# Patient Record
Sex: Male | Born: 1940 | Race: Black or African American | Hispanic: No | Marital: Married | Smoking: Former smoker
Health system: Southern US, Community
[De-identification: ages and names within clinical notes are randomized; demographics above are authoritative.]

## PROBLEM LIST (undated history)

## (undated) DIAGNOSIS — Z955 Presence of coronary angioplasty implant and graft: Secondary | ICD-10-CM

## (undated) DIAGNOSIS — Z9861 Coronary angioplasty status: Secondary | ICD-10-CM

## (undated) DIAGNOSIS — I10 Essential (primary) hypertension: Secondary | ICD-10-CM

## (undated) DIAGNOSIS — R4182 Altered mental status, unspecified: Secondary | ICD-10-CM

## (undated) DIAGNOSIS — E1169 Type 2 diabetes mellitus with other specified complication: Secondary | ICD-10-CM

## (undated) DIAGNOSIS — E669 Obesity, unspecified: Secondary | ICD-10-CM

## (undated) DIAGNOSIS — I420 Dilated cardiomyopathy: Secondary | ICD-10-CM

## (undated) DIAGNOSIS — Z9989 Dependence on other enabling machines and devices: Secondary | ICD-10-CM

## (undated) DIAGNOSIS — I214 Non-ST elevation (NSTEMI) myocardial infarction: Secondary | ICD-10-CM

## (undated) DIAGNOSIS — I2119 ST elevation (STEMI) myocardial infarction involving other coronary artery of inferior wall: Secondary | ICD-10-CM

## (undated) DIAGNOSIS — IMO0001 Reserved for inherently not codable concepts without codable children: Secondary | ICD-10-CM

## (undated) DIAGNOSIS — I255 Ischemic cardiomyopathy: Secondary | ICD-10-CM

## (undated) DIAGNOSIS — Z9189 Other specified personal risk factors, not elsewhere classified: Secondary | ICD-10-CM

## (undated) DIAGNOSIS — Z951 Presence of aortocoronary bypass graft: Secondary | ICD-10-CM

## (undated) DIAGNOSIS — G4733 Obstructive sleep apnea (adult) (pediatric): Secondary | ICD-10-CM

## (undated) DIAGNOSIS — I25810 Atherosclerosis of coronary artery bypass graft(s) without angina pectoris: Secondary | ICD-10-CM

## (undated) DIAGNOSIS — I2510 Atherosclerotic heart disease of native coronary artery without angina pectoris: Secondary | ICD-10-CM

## (undated) DIAGNOSIS — E785 Hyperlipidemia, unspecified: Secondary | ICD-10-CM

## (undated) HISTORY — DX: Dilated cardiomyopathy: I25.5

## (undated) HISTORY — DX: Obesity, unspecified: E11.69

## (undated) HISTORY — DX: Presence of aortocoronary bypass graft: Z95.1

## (undated) HISTORY — DX: Hyperlipidemia, unspecified: E78.5

## (undated) HISTORY — DX: Presence of coronary angioplasty implant and graft: Z95.5

## (undated) HISTORY — DX: Atherosclerotic heart disease of native coronary artery without angina pectoris: I25.10

## (undated) HISTORY — DX: Atherosclerosis of coronary artery bypass graft(s) without angina pectoris: I25.810

## (undated) HISTORY — DX: Obstructive sleep apnea (adult) (pediatric): G47.33

## (undated) HISTORY — DX: Non-ST elevation (NSTEMI) myocardial infarction: I21.4

## (undated) HISTORY — DX: ST elevation (STEMI) myocardial infarction involving other coronary artery of inferior wall: I21.19

## (undated) HISTORY — DX: Type 2 diabetes mellitus with other specified complication: E66.9

## (undated) HISTORY — DX: Dependence on other enabling machines and devices: Z99.89

## (undated) HISTORY — DX: Essential (primary) hypertension: I10

## (undated) HISTORY — PX: JOINT REPLACEMENT: SHX530

## (undated) HISTORY — DX: Dilated cardiomyopathy: I42.0

## (undated) HISTORY — DX: Coronary angioplasty status: Z98.61

## (undated) HISTORY — DX: Reserved for inherently not codable concepts without codable children: IMO0001

---

## 2003-03-13 DIAGNOSIS — I2510 Atherosclerotic heart disease of native coronary artery without angina pectoris: Secondary | ICD-10-CM

## 2003-03-13 DIAGNOSIS — I2119 ST elevation (STEMI) myocardial infarction involving other coronary artery of inferior wall: Secondary | ICD-10-CM

## 2003-03-13 HISTORY — DX: Atherosclerotic heart disease of native coronary artery without angina pectoris: I25.10

## 2003-03-13 HISTORY — PX: CORONARY STENT PLACEMENT: SHX1402

## 2003-03-13 HISTORY — DX: ST elevation (STEMI) myocardial infarction involving other coronary artery of inferior wall: I21.19

## 2004-04-24 ENCOUNTER — Other Ambulatory Visit: Payer: Self-pay

## 2005-11-27 ENCOUNTER — Other Ambulatory Visit: Payer: Self-pay

## 2013-01-10 DIAGNOSIS — I214 Non-ST elevation (NSTEMI) myocardial infarction: Secondary | ICD-10-CM

## 2013-01-10 DIAGNOSIS — I2510 Atherosclerotic heart disease of native coronary artery without angina pectoris: Secondary | ICD-10-CM

## 2013-01-10 DIAGNOSIS — Z951 Presence of aortocoronary bypass graft: Secondary | ICD-10-CM

## 2013-01-10 HISTORY — DX: Presence of aortocoronary bypass graft: Z95.1

## 2013-01-10 HISTORY — DX: Atherosclerotic heart disease of native coronary artery without angina pectoris: I25.10

## 2013-01-10 HISTORY — DX: Non-ST elevation (NSTEMI) myocardial infarction: I21.4

## 2013-01-25 ENCOUNTER — Encounter (HOSPITAL_COMMUNITY): Payer: Self-pay | Admitting: Emergency Medicine

## 2013-01-25 ENCOUNTER — Inpatient Hospital Stay (HOSPITAL_COMMUNITY)
Admission: EM | Admit: 2013-01-25 | Discharge: 2013-02-06 | DRG: 234 | Disposition: A | Discharge status: Home / Self Care | Payer: 59 | Attending: Cardiothoracic Surgery | Admitting: Cardiothoracic Surgery

## 2013-01-25 ENCOUNTER — Other Ambulatory Visit: Payer: Self-pay

## 2013-01-25 ENCOUNTER — Emergency Department (HOSPITAL_COMMUNITY): Payer: 59

## 2013-01-25 DIAGNOSIS — K560 Paralytic ileus: Secondary | ICD-10-CM | POA: Diagnosis not present

## 2013-01-25 DIAGNOSIS — Z96649 Presence of unspecified artificial hip joint: Secondary | ICD-10-CM

## 2013-01-25 DIAGNOSIS — I214 Non-ST elevation (NSTEMI) myocardial infarction: Principal | ICD-10-CM

## 2013-01-25 DIAGNOSIS — J9819 Other pulmonary collapse: Secondary | ICD-10-CM | POA: Diagnosis not present

## 2013-01-25 DIAGNOSIS — E669 Obesity, unspecified: Secondary | ICD-10-CM | POA: Diagnosis present

## 2013-01-25 DIAGNOSIS — Z9189 Other specified personal risk factors, not elsewhere classified: Secondary | ICD-10-CM

## 2013-01-25 DIAGNOSIS — Z96659 Presence of unspecified artificial knee joint: Secondary | ICD-10-CM

## 2013-01-25 DIAGNOSIS — E118 Type 2 diabetes mellitus with unspecified complications: Secondary | ICD-10-CM | POA: Diagnosis present

## 2013-01-25 DIAGNOSIS — I2510 Atherosclerotic heart disease of native coronary artery without angina pectoris: Secondary | ICD-10-CM

## 2013-01-25 DIAGNOSIS — J90 Pleural effusion, not elsewhere classified: Secondary | ICD-10-CM | POA: Diagnosis not present

## 2013-01-25 DIAGNOSIS — IMO0001 Reserved for inherently not codable concepts without codable children: Secondary | ICD-10-CM | POA: Diagnosis present

## 2013-01-25 DIAGNOSIS — Z8546 Personal history of malignant neoplasm of prostate: Secondary | ICD-10-CM

## 2013-01-25 DIAGNOSIS — Z7902 Long term (current) use of antithrombotics/antiplatelets: Secondary | ICD-10-CM

## 2013-01-25 DIAGNOSIS — E8779 Other fluid overload: Secondary | ICD-10-CM | POA: Diagnosis not present

## 2013-01-25 DIAGNOSIS — Z87891 Personal history of nicotine dependence: Secondary | ICD-10-CM

## 2013-01-25 DIAGNOSIS — I252 Old myocardial infarction: Secondary | ICD-10-CM

## 2013-01-25 DIAGNOSIS — I10 Essential (primary) hypertension: Secondary | ICD-10-CM

## 2013-01-25 DIAGNOSIS — K5900 Constipation, unspecified: Secondary | ICD-10-CM | POA: Diagnosis present

## 2013-01-25 DIAGNOSIS — T82897A Other specified complication of cardiac prosthetic devices, implants and grafts, initial encounter: Secondary | ICD-10-CM | POA: Diagnosis present

## 2013-01-25 DIAGNOSIS — D696 Thrombocytopenia, unspecified: Secondary | ICD-10-CM | POA: Diagnosis present

## 2013-01-25 DIAGNOSIS — G4733 Obstructive sleep apnea (adult) (pediatric): Secondary | ICD-10-CM | POA: Diagnosis present

## 2013-01-25 DIAGNOSIS — D62 Acute posthemorrhagic anemia: Secondary | ICD-10-CM | POA: Diagnosis not present

## 2013-01-25 DIAGNOSIS — Z951 Presence of aortocoronary bypass graft: Secondary | ICD-10-CM

## 2013-01-25 DIAGNOSIS — E119 Type 2 diabetes mellitus without complications: Secondary | ICD-10-CM

## 2013-01-25 DIAGNOSIS — E785 Hyperlipidemia, unspecified: Secondary | ICD-10-CM

## 2013-01-25 DIAGNOSIS — Y831 Surgical operation with implant of artificial internal device as the cause of abnormal reaction of the patient, or of later complication, without mention of misadventure at the time of the procedure: Secondary | ICD-10-CM | POA: Diagnosis present

## 2013-01-25 DIAGNOSIS — K219 Gastro-esophageal reflux disease without esophagitis: Secondary | ICD-10-CM | POA: Diagnosis present

## 2013-01-25 DIAGNOSIS — Z79899 Other long term (current) drug therapy: Secondary | ICD-10-CM

## 2013-01-25 DIAGNOSIS — Z9861 Coronary angioplasty status: Secondary | ICD-10-CM

## 2013-01-25 DIAGNOSIS — Z7982 Long term (current) use of aspirin: Secondary | ICD-10-CM

## 2013-01-25 HISTORY — DX: Other specified personal risk factors, not elsewhere classified: Z91.89

## 2013-01-25 LAB — CBC WITH DIFFERENTIAL/PLATELET
Basophils Absolute: 0 10*3/uL (ref 0.0–0.1)
Basophils Relative: 0 % (ref 0–1)
Eosinophils Absolute: 0 10*3/uL (ref 0.0–0.7)
Eosinophils Relative: 1 % (ref 0–5)
Hemoglobin: 15.8 g/dL (ref 13.0–17.0)
Lymphs Abs: 0.7 10*3/uL (ref 0.7–4.0)
MCH: 29.5 pg (ref 26.0–34.0)
MCHC: 35.7 g/dL (ref 30.0–36.0)
Neutro Abs: 3.6 10*3/uL (ref 1.7–7.7)
Neutrophils Relative %: 75 % (ref 43–77)
Platelets: 142 10*3/uL — ABNORMAL LOW (ref 150–400)
RBC: 5.36 MIL/uL (ref 4.22–5.81)

## 2013-01-25 LAB — TSH: TSH: 0.545 u[IU]/mL (ref 0.350–4.500)

## 2013-01-25 LAB — BASIC METABOLIC PANEL
GFR calc Af Amer: >90 mL/min (ref >90)
GFR calc non Af Amer: 81 mL/min — ABNORMAL LOW (ref >90)
Glucose, Bld: 283 mg/dL — ABNORMAL HIGH (ref 70–99)
Potassium: 3.7 mEq/L (ref 3.5–5.1)
Sodium: 136 mEq/L (ref 135–145)

## 2013-01-25 LAB — TROPONIN I
Troponin I: 1.66 ng/mL (ref <0.30)
Troponin I: >20 ng/mL (ref <0.30)
Troponin I: >20 ng/mL (ref <0.30)

## 2013-01-25 LAB — MRSA PCR SCREENING: MRSA by PCR: NEGATIVE

## 2013-01-25 MED ORDER — GLIMEPIRIDE 1 MG PO TABS
1.0000 mg | ORAL_TABLET | Freq: Every day | ORAL | Status: DC
  Filled 2013-01-25 (×2): qty 1

## 2013-01-25 MED ORDER — INFLUENZA VAC SPLIT QUAD 0.5 ML IM SUSP
0.5000 mL | INTRAMUSCULAR | Status: AC
  Administered 2013-01-26: 0.5 mL via INTRAMUSCULAR
  Filled 2013-01-25: qty 0.5

## 2013-01-25 MED ORDER — NITROGLYCERIN 2 % TD OINT
1.0000 [in_us] | TOPICAL_OINTMENT | Freq: Once | TRANSDERMAL | Status: AC
  Administered 2013-01-25: 1 [in_us] via TOPICAL
  Filled 2013-01-25: qty 1

## 2013-01-25 MED ORDER — SODIUM CHLORIDE 0.9 % IV SOLN: 250.0000 mL | INTRAVENOUS | Status: DC | PRN

## 2013-01-25 MED ORDER — GI COCKTAIL ~~LOC~~
30.0000 mL | Freq: Once | ORAL | Status: AC
  Administered 2013-01-25: 30 mL via ORAL
  Filled 2013-01-25: qty 30

## 2013-01-25 MED ORDER — NITROGLYCERIN 0.4 MG SL SUBL
0.4000 mg | SUBLINGUAL_TABLET | SUBLINGUAL | Status: AC | PRN
  Administered 2013-01-25 (×3): 0.4 mg via SUBLINGUAL
  Filled 2013-01-25: qty 25

## 2013-01-25 MED ORDER — SODIUM CHLORIDE 0.9 % IV SOLN
INTRAVENOUS | Status: DC
  Administered 2013-01-26: 06:00:00 via INTRAVENOUS

## 2013-01-25 MED ORDER — PANTOPRAZOLE SODIUM 40 MG PO TBEC
40.0000 mg | DELAYED_RELEASE_TABLET | Freq: Two times a day (BID) | ORAL | Status: DC
  Administered 2013-01-25 – 2013-01-29 (×10): 40 mg via ORAL
  Filled 2013-01-25 (×10): qty 1

## 2013-01-25 MED ORDER — ALUM & MAG HYDROXIDE-SIMETH 200-200-20 MG/5ML PO SUSP
30.0000 mL | Freq: Once | ORAL | Status: AC
  Administered 2013-01-25: 30 mL via ORAL
  Filled 2013-01-25: qty 30

## 2013-01-25 MED ORDER — INSULIN ASPART 100 UNIT/ML ~~LOC~~ SOLN
0.0000 [IU] | Freq: Three times a day (TID) | SUBCUTANEOUS | Status: DC
  Administered 2013-01-25: 2 [IU] via SUBCUTANEOUS
  Administered 2013-01-26: 8 [IU] via SUBCUTANEOUS
  Administered 2013-01-27: 5 [IU] via SUBCUTANEOUS
  Administered 2013-01-27 – 2013-01-29 (×7): 3 [IU] via SUBCUTANEOUS

## 2013-01-25 MED ORDER — ASPIRIN 81 MG PO CHEW
81.0000 mg | CHEWABLE_TABLET | ORAL | Status: AC
  Administered 2013-01-26: 81 mg via ORAL
  Filled 2013-01-25: qty 1

## 2013-01-25 MED ORDER — METOPROLOL SUCCINATE ER 100 MG PO TB24
200.0000 mg | ORAL_TABLET | Freq: Every day | ORAL | Status: DC
  Administered 2013-01-25 – 2013-01-29 (×4): 200 mg via ORAL
  Filled 2013-01-25 (×6): qty 2

## 2013-01-25 MED ORDER — HEPARIN (PORCINE) IN NACL 100-0.45 UNIT/ML-% IJ SOLN
1300.0000 [IU]/h | INTRAMUSCULAR | Status: DC
  Administered 2013-01-25 – 2013-01-26 (×2): 1300 [IU]/h via INTRAVENOUS
  Filled 2013-01-25 (×3): qty 250

## 2013-01-25 MED ORDER — NITROGLYCERIN IN D5W 200-5 MCG/ML-% IV SOLN
2.0000 ug/min | INTRAVENOUS | Status: DC
  Administered 2013-01-25: 20 ug/min via INTRAVENOUS
  Filled 2013-01-25: qty 250

## 2013-01-25 MED ORDER — HEPARIN BOLUS VIA INFUSION
4000.0000 [IU] | Freq: Once | INTRAVENOUS | Status: AC
  Administered 2013-01-25: 4000 [IU] via INTRAVENOUS
  Filled 2013-01-25: qty 4000

## 2013-01-25 MED ORDER — SODIUM CHLORIDE 0.9 % IJ SOLN: 3.0000 mL | INTRAMUSCULAR | Status: DC | PRN

## 2013-01-25 MED ORDER — RAMIPRIL 10 MG PO CAPS
20.0000 mg | ORAL_CAPSULE | Freq: Every day | ORAL | Status: DC
  Administered 2013-01-25 – 2013-01-29 (×4): 20 mg via ORAL
  Filled 2013-01-25 (×6): qty 2

## 2013-01-25 MED ORDER — ACETAMINOPHEN 325 MG PO TABS: 650.0000 mg | ORAL_TABLET | ORAL | Status: DC | PRN

## 2013-01-25 MED ORDER — SIMVASTATIN 40 MG PO TABS
40.0000 mg | ORAL_TABLET | Freq: Every day | ORAL | Status: DC
  Administered 2013-01-25 – 2013-02-06 (×11): 40 mg via ORAL
  Filled 2013-01-25 (×13): qty 1

## 2013-01-25 MED ORDER — ONDANSETRON HCL 4 MG/2ML IJ SOLN
4.0000 mg | Freq: Four times a day (QID) | INTRAMUSCULAR | Status: DC | PRN
  Administered 2013-01-26 (×2): 4 mg via INTRAVENOUS
  Filled 2013-01-25 (×2): qty 2

## 2013-01-25 MED ORDER — CLOPIDOGREL BISULFATE 75 MG PO TABS
75.0000 mg | ORAL_TABLET | Freq: Every day | ORAL | Status: DC
  Administered 2013-01-26: 75 mg via ORAL
  Filled 2013-01-25 (×2): qty 1

## 2013-01-25 MED ORDER — ASPIRIN 81 MG PO CHEW
324.0000 mg | CHEWABLE_TABLET | Freq: Once | ORAL | Status: AC
  Administered 2013-01-25: 324 mg via ORAL
  Filled 2013-01-25: qty 4

## 2013-01-25 MED ORDER — SODIUM CHLORIDE 0.9 % IJ SOLN
3.0000 mL | Freq: Two times a day (BID) | INTRAMUSCULAR | Status: DC
  Administered 2013-01-25 – 2013-01-26 (×3): 3 mL via INTRAVENOUS

## 2013-01-25 MED ORDER — PNEUMOCOCCAL VAC POLYVALENT 25 MCG/0.5ML IJ INJ
0.5000 mL | INJECTION | INTRAMUSCULAR | Status: AC
  Administered 2013-01-26: 0.5 mL via INTRAMUSCULAR
  Filled 2013-01-25: qty 0.5

## 2013-01-25 NOTE — ED Notes (Signed)
Increased nitro to 15 mcg/min- pt sts feels "pressure".

## 2013-01-25 NOTE — H&P (Signed)
Earl Williamson is an 72 y.o. male.   Chief Complaint: Chest Pain HPI:  The patient is a 72 yo male with a history of CAD with three stents placed several year ago at [facility].  Atleast two were in the OM3   He does not see a cardiologist regularly.  His history also includes DM, HTN, OSA.  He presents today with CP which began two week ago.  He describes it as sharp, 9/10 at the worse, radiation across chest to left arm, nausea, diaphoresis, dizziness, abd pain.  He denies vomiting, fever,  shortness of breath, orthopnea, PND, cough, congestion, hematochezia, melena, lower extremity edema.  He thought it was acid reflux however, several times he said he thought he was having a heart attack as well.  He also saw his PCP a week ago who did an EKG.  On Friday he had a severe episode while at the grocery store but decline EMS.  .   Medications: Prior to Admission medications   Medication Sig Start Date End Date Taking? Authorizing Provider  calcium elemental as carbonate (TUMS ULTRA 1000) 400 MG tablet Chew 1,000 mg by mouth as needed for heartburn.    Yes Historical Provider, MD  clopidogrel (PLAVIX) 75 MG tablet Take 75 mg by mouth daily with breakfast.   Yes Historical Provider, MD  glimepiride (AMARYL) 1 MG tablet Take 1 mg by mouth daily with breakfast.   Yes Historical Provider, MD  metFORMIN (GLUCOPHAGE) 500 MG tablet Take 500 mg by mouth 2 (two) times daily with a meal.   Yes Historical Provider, MD  metoprolol (TOPROL-XL) 200 MG 24 hr tablet Take 200 mg by mouth daily.   Yes Historical Provider, MD  pantoprazole (PROTONIX) 40 MG tablet Take 40 mg by mouth 2 (two) times daily.    Yes Historical Provider, MD  ramipril (ALTACE) 10 MG capsule Take 20 mg by mouth daily.   Yes Historical Provider, MD  simethicone (GAS-X EXTRA STRENGTH) 125 MG chewable tablet Chew 125 mg by mouth every 6 (six) hours as needed for flatulence.    Yes Historical Provider, MD  simvastatin (ZOCOR) 40 MG tablet Take 40 mg by  mouth daily.   Yes Historical Provider, MD      Past Medical History  Diagnosis Date  . Coronary artery disease   . Diabetes mellitus without complication   . Hypertension   . Sleep apnea t  . Prostate cancer t    Past Surgical History  Procedure Laterality Date  . Joint replacement    . Cardiac surgery      Family History  Problem Relation Age of Onset  . Heart attack Mother   . Heart attack Sister   . Heart attack Brother    Social History:  reports that he has quit smoking. His smoking use included Cigarettes. He smoked 0.00 packs per day. He has never used smokeless tobacco. He reports that he does not drink alcohol or use illicit drugs.  Allergies: No Known Allergies   (Not in a hospital admission)  Results for orders placed during the hospital encounter of 01/25/13 (from the past 48 hour(s))  CBC WITH DIFFERENTIAL     Status: Abnormal   Collection Time    01/25/13  9:35 AM      Result Value Range   WBC 4.9  4.0 - 10.5 K/uL   RBC 5.36  4.22 - 5.81 MIL/uL   Hemoglobin 15.8  13.0 - 17.0 g/dL   HCT 13.0  86.5 -  52.0 %   MCV 82.5  78.0 - 100.0 fL   MCH 29.5  26.0 - 34.0 pg   MCHC 35.7  30.0 - 36.0 g/dL   RDW 45.4  09.8 - 11.9 %   Platelets 142 (*) 150 - 400 K/uL   Neutrophils Relative % 75  43 - 77 %   Neutro Abs 3.6  1.7 - 7.7 K/uL   Lymphocytes Relative 15  12 - 46 %   Lymphs Abs 0.7  0.7 - 4.0 K/uL   Monocytes Relative 10  3 - 12 %   Monocytes Absolute 0.5  0.1 - 1.0 K/uL   Eosinophils Relative 1  0 - 5 %   Eosinophils Absolute 0.0  0.0 - 0.7 K/uL   Basophils Relative 0  0 - 1 %   Basophils Absolute 0.0  0.0 - 0.1 K/uL  BASIC METABOLIC PANEL     Status: Abnormal   Collection Time    01/25/13  9:35 AM      Result Value Range   Sodium 136  135 - 145 mEq/L   Potassium 3.7  3.5 - 5.1 mEq/L   Chloride 98  96 - 112 mEq/L   CO2 21  19 - 32 mEq/L   Glucose, Bld 283 (*) 70 - 99 mg/dL   BUN 11  6 - 23 mg/dL   Creatinine, Ser 1.47  0.50 - 1.35 mg/dL    Calcium 9.5  8.4 - 82.9 mg/dL   GFR calc non Af Amer 81 (*) >90 mL/min   GFR calc Af Amer >90  >90 mL/min   Comment: (NOTE)     The eGFR has been calculated using the CKD EPI equation.     This calculation has not been validated in all clinical situations.     eGFR's persistently <90 mL/min signify possible Chronic Kidney     Disease.  TROPONIN I     Status: Abnormal   Collection Time    01/25/13  9:35 AM      Result Value Range   Troponin I 1.66 (*) <0.30 ng/mL   Comment: CRITICAL RESULT CALLED TO, READ BACK BY AND VERIFIED WITH:     DAVISWRN [id number]                Due to the release kinetics of cTnI,     a negative result within the first hours     of the onset of symptoms does not rule out     myocardial infarction with certainty.     If myocardial infarction is still suspected,     repeat the test at appropriate intervals.     MCCAULEG RTV  PROTIME-INR     Status: None   Collection Time    01/25/13  9:35 AM      Result Value Range   Prothrombin Time 13.3  11.6 - 15.2 seconds   INR 1.03  0.00 - 1.49  APTT     Status: Abnormal   Collection Time    01/25/13  9:35 AM      Result Value Range   aPTT 49 (*) 24 - 37 seconds   Comment:            IF BASELINE aPTT IS ELEVATED,     SUGGEST PATIENT RISK ASSESSMENT     BE USED TO DETERMINE APPROPRIATE     ANTICOAGULANT THERAPY.   Dg Chest 2 View  01/25/2013   CLINICAL DATA:  Chest pain and shortness of breath  for 1 week. Hypertension. Ex-smoker.  EXAM: CHEST  2 VIEW  COMPARISON:  None.  FINDINGS: Lateral view degraded by patient arm position. Moderate lower thoracic spondylosis. Midline trachea. Moderate cardiomegaly. Mild right hemidiaphragm elevation. No pleural effusion or pneumothorax. No congestive failure. Clear lungs.  IMPRESSION: Cardiomegaly, without congestive failure or acute disease.   Electronically Signed   By: Jeronimo Greaves M.D.   On: 01/25/2013 09:52    Review of Systems  Constitutional: Positive for  diaphoresis. Negative for fever.  HENT: Negative for congestion and sore throat.   Respiratory: Negative for cough and shortness of breath.   Cardiovascular: Positive for chest pain. Negative for orthopnea, leg swelling and PND.  Gastrointestinal: Positive for nausea and abdominal pain. Negative for vomiting, blood in stool and melena.  Genitourinary: Negative for hematuria.  Musculoskeletal: Positive for myalgias.  Neurological: Positive for dizziness.  All other systems reviewed and are negative.    Blood pressure 175/85, pulse 79, temperature 98 F (36.7 C), temperature source Oral, resp. rate 20, height 5\' 6"  (1.676 m), weight 230 lb (104.327 kg), SpO2 96.00%. Physical Exam  Nursing note and vitals reviewed. Constitutional: He is oriented to person, place, and time. He appears well-developed. No distress.  Obese  HENT:  Head: Normocephalic and atraumatic.  Mouth/Throat: No oropharyngeal exudate.  Eyes: EOM are normal. Pupils are equal, round, and reactive to light. No scleral icterus.  Neck: Normal range of motion. Neck supple. No JVD present.  Cardiovascular: Normal rate, regular rhythm, S1 normal and S2 normal.   No murmur heard. Pulses:      Radial pulses are 2+ on the right side, and 2+ on the left side.       Dorsalis pedis pulses are 2+ on the right side, and 1+ on the left side.       Posterior tibial pulses are 2+ on the left side.  No Carotid Bruits  Respiratory: Effort normal and breath sounds normal. He has no wheezes. He has no rales.  GI: Soft. Bowel sounds are normal. He exhibits distension. There is tenderness (RLQ).  Musculoskeletal: He exhibits no edema.  Neurological: He is alert and oriented to person, place, and time. He exhibits normal muscle tone.  Skin: Skin is warm and dry.  Psychiatric: He has a normal mood and affect.     Assessment/Plan  Principal Problem:   NSTEMI (non-ST elevated myocardial infarction) Active Problems:   CAD S/P percutaneous  coronary angioplasty - PCI  x 3 stents @ [facility]   DM type 2 (diabetes mellitus, type 2)   HTN (hypertension), benign   HLD (hyperlipidemia)  Plan: Admit to stepdown.  IV heparin and NTG.  ASA, plavix, BB, statin, ACE.  SCr is WNL.  Left heart cath tomorrow.  CBGs.  SS insulin.     HAGER, BRYAN 01/25/2013, 12:47 PM  I have seen and evaluated the patient this PM along Honor Junes, PA. I agree with his findings, examination as well as impression recommendations.  The patient is a 72 year old gentleman with known coronary disease along with multiple cardiac risk factors as listed. As best I can tell he currently had a stent in his RCA as well as LAD and circumflex. I don't have records for the RCA, but he was told he had a staged intervention secondary to the other side artery.  He's been having about 2 weeks of intermittent chest comfort that he describes as a sharp, he felt this was similar to his heart attack. Despite the current  description of his symptoms, he at the time felt that this was due to feeling bloated with gas.  His initial troponin levels are actually positive, and his ECG from EMS did show mild ST elevations in lead 3 only her there's significant Q waves in inferior leads with the lateral ST depressions. Initial EMS ECG was read as meeting criteria for STEMI, however upon arrival to the emergency room he was pain-free, in the ER the ECG that was checked was less suggestive of any true ST elevations.  Or evaluation in the emergency room, the patient remains symptom-free,  So we will simply admit him as a non-ST elevation MI. He will place on nitroglycerin drip as well as IV heparin. His artery on Plavix as well as beta blocker and ACE inhibitor.  The plan will be left heart catheterization with possible PCI tomorrow by one of my partners. I reviewed the procedure was well as the risks, benefits, alternatives and indications of the procedure with the patient and family. He agrees to  proceed.  We will ensure that he is on CPAP overnight. I've also instructed him that he was to have any significant discomfort not relieved by nitroglycerin that we should go ahead and urgent catheterization today.   Marykay Lex, M.D., M.S. [facility] [address] [place], [place]  [address]  [phone] Pager # [phone] 01/25/2013 3:32 PM

## 2013-01-25 NOTE — Progress Notes (Signed)
I am not sure why some pre-cath nursing orders are showing d/c by me, so I re-order them so night shift can see them.

## 2013-01-25 NOTE — Progress Notes (Signed)
Pt reported "feelings like gas under my rib. Multiple burping episodes noted. Maalox ordered x 1.

## 2013-01-25 NOTE — Progress Notes (Signed)
Patient  C/O left chest pain 5/10  SL Nitro 0.4 mg administered x 3, 02 initiated @4LNC  , EKG done.  Chest Pain is now 2/10 non-radiating pain. Pt also reported pain started after eating chicken and mash potatoes. MD paged we will continue to monitor.

## 2013-01-25 NOTE — ED Provider Notes (Signed)
CSN: [id number]     Arrival date & time 01/25/13  0905 History   First MD Initiated Contact with Patient 01/25/13 0913     Chief Complaint  Patient presents with  . Chest Pain   (Consider location/radiation/quality/duration/timing/severity/associated sxs/prior Treatment) Patient is a 72 y.o. male presenting with chest pain.  Chest Pain  Pt with known history of CAD s/p stent x 3 several years ago, done at [facility] reports about a week of off and on sharp mid sternal chest pain, associated with mild SOB, comes and goes, seems to be worse after eating but also with exertion. He apparently saw his PCP for similar symptoms earlier this week and is scheduled for Echo to be done tomorrow. He does not follow with a cardiologist on a regular basis. Pain improved with NTG given by EMS, he does not take NTG at home.   Past Medical History  Diagnosis Date  . Coronary artery disease   . Diabetes mellitus without complication   . Hypertension   . Sleep apnea t  . Prostate cancer t   Past Surgical History  Procedure Laterality Date  . Joint replacement    . Cardiac surgery     History reviewed. No pertinent family history. History  Substance Use Topics  . Smoking status: Former Games developer  . Smokeless tobacco: Never Used  . Alcohol Use: No    Review of Systems  Cardiovascular: Positive for chest pain.   All other systems reviewed and are negative except as noted in HPI.   Allergies  Review of patient's allergies indicates no known allergies.  Home Medications   Current Outpatient Rx  Name  Route  Sig  Dispense  Refill  . calcium elemental as carbonate (TUMS ULTRA 1000) 400 MG tablet   Oral   Chew 1,000 mg by mouth as needed for heartburn.          . clopidogrel (PLAVIX) 75 MG tablet   Oral   Take 75 mg by mouth daily with breakfast.         . glimepiride (AMARYL) 1 MG tablet   Oral   Take 1 mg by mouth daily with breakfast.         . metFORMIN (GLUCOPHAGE) 500 MG tablet    Oral   Take 500 mg by mouth 2 (two) times daily with a meal.         . metoprolol (TOPROL-XL) 200 MG 24 hr tablet   Oral   Take 200 mg by mouth daily.         . pantoprazole (PROTONIX) 40 MG tablet   Oral   Take 40 mg by mouth 2 (two) times daily.          . ramipril (ALTACE) 10 MG capsule   Oral   Take 20 mg by mouth daily.         . simethicone (GAS-X EXTRA STRENGTH) 125 MG chewable tablet   Oral   Chew 125 mg by mouth every 6 (six) hours as needed for flatulence.          . simvastatin (ZOCOR) 40 MG tablet   Oral   Take 40 mg by mouth daily.          BP 175/85  Pulse 69  Temp(Src) 98 F (36.7 C) (Oral)  Resp 16  SpO2 97% Physical Exam  Nursing note and vitals reviewed. Constitutional: He is oriented to person, place, and time. He appears well-developed and well-nourished.  HENT:  Head: Normocephalic  and atraumatic.  Eyes: EOM are normal. Pupils are equal, round, and reactive to light.  Neck: Normal range of motion. Neck supple.  Cardiovascular: Normal rate, normal heart sounds and intact distal pulses.   Pulmonary/Chest: Effort normal and breath sounds normal.  Abdominal: Bowel sounds are normal. He exhibits no distension. There is no tenderness.  Musculoskeletal: Normal range of motion. He exhibits no edema and no tenderness.  Neurological: He is alert and oriented to person, place, and time. He has normal strength. No cranial nerve deficit or sensory deficit.  Skin: Skin is warm and dry. No rash noted.  Psychiatric: He has a normal mood and affect.    ED Course  Procedures (including critical care time)  CRITICAL CARE Performed by: Pollyann Savoy. Total critical care time: 30 Critical care time was exclusive of separately billable procedures and treating other patients. Critical care was necessary to treat or prevent imminent or life-threatening deterioration. Critical care was time spent personally by me on the following activities:  development of treatment plan with patient and/or surrogate as well as nursing, discussions with consultants, evaluation of patient's response to treatment, examination of patient, obtaining history from patient or surrogate, ordering and performing treatments and interventions, ordering and review of laboratory studies, ordering and review of radiographic studies, pulse oximetry and re-evaluation of patient's condition.   Labs Review Labs Reviewed  CBC WITH DIFFERENTIAL - Abnormal; Notable for the following:    Platelets 142 (*)    All other components within normal limits  BASIC METABOLIC PANEL - Abnormal; Notable for the following:    Glucose, Bld 283 (*)    GFR calc non Af Amer 81 (*)    All other components within normal limits  TROPONIN I - Abnormal; Notable for the following:    Troponin I 1.66 (*)    All other components within normal limits  APTT - Abnormal; Notable for the following:    aPTT 49 (*)    All other components within normal limits  PROTIME-INR   Imaging Review Dg Chest 2 View  01/25/2013   CLINICAL DATA:  Chest pain and shortness of breath for 1 week. Hypertension. Ex-smoker.  EXAM: CHEST  2 VIEW  COMPARISON:  None.  FINDINGS: Lateral view degraded by patient arm position. Moderate lower thoracic spondylosis. Midline trachea. Moderate cardiomegaly. Mild right hemidiaphragm elevation. No pleural effusion or pneumothorax. No congestive failure. Clear lungs.  IMPRESSION: Cardiomegaly, without congestive failure or acute disease.   Electronically Signed   By: Jeronimo Greaves M.D.   On: 01/25/2013 09:52    EKG Interpretation     Ventricular Rate:  74 PR Interval:  184 QRS Duration: 109 QT Interval:  417 QTC Calculation: 463 R Axis:   -5 Text Interpretation:  Sinus rhythm Probable left atrial enlargement LVH with secondary repolarization abnormality Inferior infarct, old Probable anterior infarct, age indeterminate No old tracing to compare            MDM   No diagnosis found.  Labs reviewed. Trop elevated, consistent with non-STEMI. Pt is currently pain free after GI cocktail, heparin gtt and Nitro paste ordered. Discussed with Dr. Herbie Baltimore who will evaluate the patient in the ED.     Charles B. Bernette Mayers, MD 01/25/13 1110

## 2013-01-25 NOTE — Progress Notes (Signed)
ANTICOAGULATION CONSULT NOTE - Initial Consult  Pharmacy Consult for heparin Indication: chest pain/ACS  No Known Allergies  Patient Measurements:    Weight: 104.3 kg per pt statement  Vital Signs: Temp: 98 F (36.7 C) (11/16 0900) Temp src: Oral (11/16 0900) BP: 175/85 mmHg (11/16 0955) Pulse Rate: 69 (11/16 0955)  Labs:  Recent Labs  01/25/13 0935  HGB 15.8  HCT 44.2  PLT 142*  APTT 49*  LABPROT 13.3  INR 1.03  CREATININE 0.97  TROPONINI 1.66*    CrCl is unknown because there is no height on file for the current visit.   Medical History: Past Medical History  Diagnosis Date  . Coronary artery disease   . Diabetes mellitus without complication   . Hypertension   . Sleep apnea t  . Prostate cancer t    Medications:  plavix  Assessment: Earl Williamson is a 72 yo man to start heparin per pharmacy for chest pain with a positive troponin.  He states his ht is 66 inches and his wt is 230 pounds.  His baseline aPTT is slightly elevated at 49 sec, his INR is 1.03 and his H/H is 15.8/44.2 and his platelets are on the low side at 142. His creat is 0.97. His troponin is elevated at 1.66.  Cardiology to be consulted.   Goal of Therapy:  Heparin level 0.3-0.7 units/ml Monitor platelets by anticoagulation protocol: Yes   Plan:  Give 4000 units bolus x 1 Start heparin infusion at 1300 units/hr Check anti-Xa level in 8 hours and daily while on heparin Continue to monitor H&H and platelets  Herby Abraham, Pharm.D. [phone] 01/25/2013 11:08 AM

## 2013-01-25 NOTE — Progress Notes (Signed)
ANTICOAGULATION CONSULT NOTE - Follow Up Consult  Pharmacy Consult for Heparin Indication: chest pain/ACS  No Known Allergies Patient Measurements: Height: 5\' 6"  (167.6 cm) Weight: 283 lb 4.7 oz (128.5 kg) IBW/kg (Calculated) : 63.8 Heparin Dosing Weight: 94 kg Vital Signs: Temp: 98 F (36.7 C) (11/16 1515) Temp src: Oral (11/16 1515) BP: 149/88 mmHg (11/16 1900) Pulse Rate: 71 (11/16 1900) Labs:  Recent Labs  01/25/13 0935 01/25/13 1610 01/25/13 1935  HGB 15.8  --   --   HCT 44.2  --   --   PLT 142*  --   --   APTT 49*  --   --   LABPROT 13.3  --   --   INR 1.03  --   --   HEPARINUNFRC  --   --  0.44  CREATININE 0.97  --   --   TROPONINI 1.66* >20.00* >20.00*   Estimated Creatinine Clearance: 88.6 ml/min (by C-G formula based on Cr of 0.97).  Medications:  Heparin @ 1300 units/hr  Assessment: [name] on IV heparin for ACS/+ troponin.   Goal of Therapy:  Heparin level 0.3-0.7 units/ml Monitor platelets by anticoagulation protocol: Yes   Plan:  1. Continue heparin at 1300 units/hr. 2. Recheck heparin level in  8 hrs (AM level).  Fayne Norrie 01/25/2013,8:42 PM

## 2013-01-25 NOTE — ED Notes (Signed)
Advised dr Bernette Mayers of critical lab.

## 2013-01-25 NOTE — ED Notes (Signed)
Pt arrives to ed via [facility] for CP x 1 week.  Pt sts not get tx because thoght it was reflux.  Pt given 1-0.4 sl nitro by ems. Pt took 325 ASA at home.  Pt sts pain 7/10.  Pt caox4, pmsx4, nad.  Pt denies recent illness/injury.

## 2013-01-25 NOTE — Progress Notes (Signed)
B/P 143/96 HR 64 RR 19 02 Sat 99 % 4LNC .  Pt denies left chest pain and reported feeling better after sitting at the side of his bed.We will continue to monitor.

## 2013-01-26 ENCOUNTER — Encounter (HOSPITAL_COMMUNITY)
Admission: EM | Disposition: A | Discharge status: Home / Self Care | Payer: Self-pay | Source: Home / Self Care | Attending: Cardiothoracic Surgery

## 2013-01-26 ENCOUNTER — Other Ambulatory Visit: Payer: Self-pay | Admitting: *Deleted

## 2013-01-26 DIAGNOSIS — Z9861 Coronary angioplasty status: Secondary | ICD-10-CM

## 2013-01-26 DIAGNOSIS — I2510 Atherosclerotic heart disease of native coronary artery without angina pectoris: Secondary | ICD-10-CM

## 2013-01-26 DIAGNOSIS — I214 Non-ST elevation (NSTEMI) myocardial infarction: Secondary | ICD-10-CM

## 2013-01-26 DIAGNOSIS — Z01810 Encounter for preprocedural cardiovascular examination: Secondary | ICD-10-CM

## 2013-01-26 HISTORY — PX: LEFT HEART CATHETERIZATION WITH CORONARY ANGIOGRAM: SHX5451

## 2013-01-26 LAB — BASIC METABOLIC PANEL
CO2: 26 mEq/L (ref 19–32)
Chloride: 100 mEq/L (ref 96–112)
Creatinine, Ser: 0.88 mg/dL (ref 0.50–1.35)
GFR calc Af Amer: >90 mL/min (ref >90)
Potassium: 3.6 mEq/L (ref 3.5–5.1)

## 2013-01-26 LAB — GLUCOSE, CAPILLARY
Glucose-Capillary: 237 mg/dL — ABNORMAL HIGH (ref 70–99)
Glucose-Capillary: 255 mg/dL — ABNORMAL HIGH (ref 70–99)

## 2013-01-26 LAB — URINALYSIS, ROUTINE W REFLEX MICROSCOPIC
Glucose, UA: >1000 mg/dL — AB
Hgb urine dipstick: NEGATIVE
Ketones, ur: NEGATIVE mg/dL
Leukocytes, UA: NEGATIVE
pH: 6 (ref 5.0–8.0)

## 2013-01-26 LAB — BLOOD GAS, ARTERIAL
Acid-base deficit: 0.2 mmol/L (ref 0.0–2.0)
Bicarbonate: 23.7 mEq/L (ref 20.0–24.0)
Drawn by: 362771
FIO2: 0.21 %
O2 Saturation: 90.3 %
Patient temperature: 98.6
TCO2: 24.8 mmol/L (ref 0–100)
pCO2 arterial: 36.9 mmHg (ref 35.0–45.0)
pH, Arterial: 7.423 (ref 7.350–7.450)
pO2, Arterial: 58.9 mmHg — ABNORMAL LOW (ref 80.0–100.0)

## 2013-01-26 LAB — CBC
HCT: 40.4 % (ref 39.0–52.0)
MCH: 29.5 pg (ref 26.0–34.0)
MCV: 83.5 fL (ref 78.0–100.0)
Platelets: 141 10*3/uL — ABNORMAL LOW (ref 150–400)
RBC: 4.84 MIL/uL (ref 4.22–5.81)
RDW: 13.5 % (ref 11.5–15.5)
WBC: 6.3 10*3/uL (ref 4.0–10.5)

## 2013-01-26 LAB — MAGNESIUM: Magnesium: 1.7 mg/dL (ref 1.5–2.5)

## 2013-01-26 LAB — HEPARIN LEVEL (UNFRACTIONATED)
Heparin Unfractionated: 0.35 IU/mL (ref 0.30–0.70)
Heparin Unfractionated: 0.21 IU/mL — ABNORMAL LOW (ref 0.30–0.70)

## 2013-01-26 LAB — SURGICAL PCR SCREEN
MRSA, PCR: NEGATIVE
Staphylococcus aureus: NEGATIVE

## 2013-01-26 LAB — URINE MICROSCOPIC-ADD ON

## 2013-01-26 LAB — LIPID PANEL
Cholesterol: 150 mg/dL (ref 0–200)
LDL Cholesterol: 86 mg/dL (ref 0–99)
VLDL: 25 mg/dL (ref 0–40)

## 2013-01-26 LAB — PROTIME-INR: Prothrombin Time: 13.9 seconds (ref 11.6–15.2)

## 2013-01-26 LAB — TROPONIN I: Troponin I: >20 ng/mL (ref <0.30)

## 2013-01-26 SURGERY — LEFT HEART CATHETERIZATION WITH CORONARY ANGIOGRAM
Anesthesia: LOCAL

## 2013-01-26 MED ORDER — INSULIN GLARGINE 100 UNIT/ML ~~LOC~~ SOLN
14.0000 [IU] | Freq: Every day | SUBCUTANEOUS | Status: DC
  Administered 2013-01-26: 14 [IU] via SUBCUTANEOUS
  Filled 2013-01-26 (×2): qty 0.14

## 2013-01-26 MED ORDER — ASPIRIN 81 MG PO CHEW
81.0000 mg | CHEWABLE_TABLET | Freq: Every day | ORAL | Status: DC
  Administered 2013-01-27 – 2013-01-29 (×3): 81 mg via ORAL
  Filled 2013-01-26 (×3): qty 1

## 2013-01-26 MED ORDER — HEPARIN (PORCINE) IN NACL 2-0.9 UNIT/ML-% IJ SOLN
INTRAMUSCULAR | Status: AC
  Filled 2013-01-26: qty 1500

## 2013-01-26 MED ORDER — HEPARIN (PORCINE) IN NACL 100-0.45 UNIT/ML-% IJ SOLN
1500.0000 [IU]/h | INTRAMUSCULAR | Status: DC
  Administered 2013-01-26: 1300 [IU]/h via INTRAVENOUS
  Administered 2013-01-27 – 2013-01-28 (×2): 1500 [IU]/h via INTRAVENOUS
  Filled 2013-01-26 (×8): qty 250

## 2013-01-26 MED ORDER — MORPHINE SULFATE 2 MG/ML IJ SOLN: 1.0000 mg | INTRAMUSCULAR | Status: DC | PRN

## 2013-01-26 MED ORDER — LIDOCAINE HCL (PF) 1 % IJ SOLN
INTRAMUSCULAR | Status: AC
  Filled 2013-01-26: qty 30

## 2013-01-26 MED ORDER — MIDAZOLAM HCL 2 MG/2ML IJ SOLN
INTRAMUSCULAR | Status: AC
  Filled 2013-01-26: qty 2

## 2013-01-26 MED ORDER — HEPARIN SODIUM (PORCINE) 1000 UNIT/ML IJ SOLN
INTRAMUSCULAR | Status: AC
  Filled 2013-01-26: qty 1

## 2013-01-26 MED ORDER — NITROGLYCERIN 0.2 MG/ML ON CALL CATH LAB
INTRAVENOUS | Status: AC
  Filled 2013-01-26: qty 1

## 2013-01-26 MED ORDER — MAGNESIUM OXIDE 400 (241.3 MG) MG PO TABS
400.0000 mg | ORAL_TABLET | Freq: Every day | ORAL | Status: DC
  Administered 2013-01-27 – 2013-01-29 (×3): 400 mg via ORAL
  Filled 2013-01-26 (×4): qty 1

## 2013-01-26 MED ORDER — MAGNESIUM OXIDE 400 (241.3 MG) MG PO TABS: 400.0000 mg | ORAL_TABLET | Freq: Every day | ORAL | Status: DC

## 2013-01-26 MED ORDER — VERAPAMIL HCL 2.5 MG/ML IV SOLN
INTRAVENOUS | Status: AC
  Filled 2013-01-26: qty 2

## 2013-01-26 MED ORDER — ONDANSETRON HCL 4 MG/2ML IJ SOLN: 4.0000 mg | Freq: Four times a day (QID) | INTRAMUSCULAR | Status: DC | PRN

## 2013-01-26 MED ORDER — FENTANYL CITRATE 0.05 MG/ML IJ SOLN
INTRAMUSCULAR | Status: AC
  Filled 2013-01-26: qty 2

## 2013-01-26 MED ORDER — SODIUM CHLORIDE 0.9 % IV SOLN
INTRAVENOUS | Status: AC
  Administered 2013-01-26: 14:00:00 via INTRAVENOUS

## 2013-01-26 MED ORDER — ACETAMINOPHEN 325 MG PO TABS: 650.0000 mg | ORAL_TABLET | ORAL | Status: DC | PRN

## 2013-01-26 NOTE — Interval H&P Note (Signed)
Cath Lab Visit (complete for each Cath Lab visit)  Clinical Evaluation Leading to the Procedure:   ACS: yes  Non-ACS:    Anginal Classification: CCS IV  Anti-ischemic medical therapy: Maximal Therapy (2 or more classes of medications)  Non-Invasive Test Results: No non-invasive testing performed  Prior CABG: No previous CABG      History and Physical Interval Note:  01/26/2013 10:42 AM  Earl Williamson  has presented today for surgery, with the diagnosis of cp  The various methods of treatment have been discussed with the patient and family. After consideration of risks, benefits and other options for treatment, the patient has consented to  Procedure(s): LEFT HEART CATHETERIZATION WITH CORONARY ANGIOGRAM (N/A) as a surgical intervention .  The patient's history has been reviewed, patient examined, no change in status, stable for surgery.  I have reviewed the patient's chart and labs.  Questions were answered to the patient's satisfaction.     Runell Gess

## 2013-01-26 NOTE — Progress Notes (Signed)
Inpatient Diabetes Program Recommendations  AACE/ADA: New Consensus Statement on Inpatient Glycemic Control (2013)  Target Ranges:  Prepandial:   less than 140 mg/dL      Peak postprandial:   less than 180 mg/dL (1-2 hours)      Critically ill patients:  140 - 180 mg/dL   Reason for Visit: Results for ROCKEY, GUARINO (MRN [id number]) as of 01/26/2013 12:50  Ref. Range 01/25/2013 15:16 01/25/2013 22:00  Glucose-Capillary Latest Range: 70-99 mg/dL 409 (H) 811 (H)  Results for HAWKEN, BIELBY (MRN [id number]) as of 01/26/2013 12:50  Ref. Range 01/25/2013 16:10  Hemoglobin A1C Latest Range: <5.7 % 10.7 (H)   According to Medication reconciliation, patient was only taking Metformin prior to admission.  May consider adding basal insulin Levemir 25 units daily (0.2 units/kg) while in the hospital.  Patient will likely need insulin at discharge based on A1C.  Will follow.    Beryl Meager, RN, BC-ADM Inpatient Diabetes Coordinator Pager [phone]

## 2013-01-26 NOTE — Progress Notes (Signed)
Subjective: Pain free  Objective: Vital signs in last 24 hours: Temp:  [98 F (36.7 C)-98.2 F (36.8 C)] 98.2 F (36.8 C) (11/17 0000) Pulse Rate:  [60-134] 63 (11/17 0700) Resp:  [11-24] 20 (11/17 0700) BP: (104-175)/(54-121) 139/70 mmHg (11/17 0700) SpO2:  [96 %-100 %] 99 % (11/17 0700) Weight:  [230 lb (104.327 kg)-283 lb 4.7 oz (128.5 kg)] 283 lb 4.7 oz (128.5 kg) (11/16 1505) Last BM Date: 01/24/13  Intake/Output from previous day: 11/16 0701 - 11/17 0700 In: 338.5 [P.O.:240; I.V.:98.5] Out: 2350 [Urine:2350] Intake/Output this shift:    Medications Current Facility-Administered Medications  Medication Dose Route Frequency Provider Last Rate Last Dose  . 0.9 %  sodium chloride infusion   Intravenous Continuous Wilburt Finlay, PA-C 10 mL/hr at 01/26/13 0700    . 0.9 %  sodium chloride infusion  250 mL Intravenous PRN Wilburt Finlay, PA-C      . acetaminophen (TYLENOL) tablet 650 mg  650 mg Oral Q4H PRN Wilburt Finlay, PA-C      . clopidogrel (PLAVIX) tablet 75 mg  75 mg Oral Q breakfast Wilburt Finlay, PA-C   75 mg at 01/26/13 1610  . glimepiride (AMARYL) tablet 1 mg  1 mg Oral Q breakfast Wilburt Finlay, PA-C      . heparin ADULT infusion 100 units/mL (25000 units/250 mL)  1,300 Units/hr Intravenous Continuous Herby Abraham, RPH 13 mL/hr at 01/26/13 0700 1,300 Units/hr at 01/26/13 0700  . influenza vac split quadrivalent PF (FLUARIX) injection 0.5 mL  0.5 mL Intramuscular Tomorrow-1000 Wilburt Finlay, PA-C      . insulin aspart (novoLOG) injection 0-15 Units  0-15 Units Subcutaneous TID WC Wilburt Finlay, PA-C   2 Units at 01/25/13 1627  . metoprolol succinate (TOPROL-XL) 24 hr tablet 200 mg  200 mg Oral Daily Wilburt Finlay, PA-C   200 mg at 01/25/13 1625  . nitroGLYCERIN 0.2 mg/mL in dextrose 5 % infusion  2-200 mcg/min Intravenous Titrated Wilburt Finlay, PA-C 6 mL/hr at 01/26/13 0700 20 mcg/min at 01/26/13 0700  . ondansetron (ZOFRAN) injection 4 mg  4 mg Intravenous Q6H PRN Wilburt Finlay,  PA-C      . pantoprazole (PROTONIX) EC tablet 40 mg  40 mg Oral BID Wilburt Finlay, PA-C   40 mg at 01/25/13 2216  . pneumococcal 23 valent vaccine (PNU-IMMUNE) injection 0.5 mL  0.5 mL Intramuscular Tomorrow-1000 Wilburt Finlay, PA-C      . ramipril (ALTACE) capsule 20 mg  20 mg Oral Daily Wilburt Finlay, PA-C   20 mg at 01/25/13 1625  . simvastatin (ZOCOR) tablet 40 mg  40 mg Oral Daily Wilburt Finlay, PA-C   40 mg at 01/25/13 1623  . sodium chloride 0.9 % injection 3 mL  3 mL Intravenous Q12H Wilburt Finlay, PA-C   3 mL at 01/25/13 2216  . sodium chloride 0.9 % injection 3 mL  3 mL Intravenous PRN Wilburt Finlay, PA-C        PE: General appearance: alert, cooperative and no distress Lungs: clear to auscultation bilaterally Heart: regular rate and rhythm, S1, S2 normal, no murmur, click, rub or gallop Abdomen: +BS, Nontender Extremities: No LEE Pulses: 2+ and symmetric Neurologic: Grossly normal  Lab Results:   Recent Labs  01/25/13 0935 01/26/13 0335  WBC 4.9 6.3  HGB 15.8 14.3  HCT 44.2 40.4  PLT 142* 141*   BMET  Recent Labs  01/25/13 0935 01/26/13 0335  NA 136 138  K 3.7 3.6  CL 98 100  CO2 21 26  GLUCOSE 283* 244*  BUN 11 10  CREATININE 0.97 0.88  CALCIUM 9.5 8.9   PT/INR  Recent Labs  01/25/13 0935 01/26/13 0335  LABPROT 13.3 13.9  INR 1.03 1.09   Cholesterol  Recent Labs  01/26/13 0335  CHOL 150   Lipid Panel     Component Value Date/Time   CHOL 150 01/26/2013 0335   TRIG 127 01/26/2013 0335   HDL 39* 01/26/2013 0335   CHOLHDL 3.8 01/26/2013 0335   VLDL 25 01/26/2013 0335   LDLCALC 86 01/26/2013 0335   Cardiac Panel (last 3 results)  Recent Labs  01/25/13 1610 01/25/13 1935 01/26/13 0335  TROPONINI >20.00* >20.00* >20.00*    Assessment/Plan  Principal Problem:   NSTEMI (non-ST elevated myocardial infarction) Active Problems:   CAD S/P percutaneous coronary angioplasty - PCI  x 3 stents @ [facility]   DM type 2 (diabetes mellitus, type 2)   HTN  (hypertension), benign   HLD (hyperlipidemia)   Thrombocytopenia  Plan: Pain free.  Troponin >20!  Left heart cath today.  BP and HR stable.     LOS: 1 day    HAGER, BRYAN 01/26/2013 8:07 AM   Agree with note written by Jones Skene PAC  S/P NSTEMI with Trop > 20. H/O Stent X 3 at [facility] 2005. Symptomatic for the past week. Currently pain free on iv hep/NTG. EKG shows evolution of Anterolateral TWI as well as a hint of Inf ST elevation . Exam benign. Plan cor angio today radially.   Runell Gess 01/26/2013 8:44 AM

## 2013-01-26 NOTE — Progress Notes (Signed)
Placed pt on CPAP via nasal mask, auto titrate settings (min 6.0-max 20.0)  Pt. Tolerating well at this time. RN aware.

## 2013-01-26 NOTE — Consult Note (Signed)
[address]       [place] [address]             [phone]        Karriem Muench [facility] Medical Record #[id number] Date of Birth: 05/06/1940  Referring: No ref. provider found Primary Care: No primary provider on file.  Chief Complaint:    Chief Complaint  Patient presents with  . Chest Pain   chest pain, epigastric discomfort present for the past 2 weeks but with severe increase in intensity the past 48 hours. Patient examined and cardiac catheterization with coronary angiogram is reviewed  History of Present Illness:     72 year old obese African  American male admitted with a subendocardial MI with troponin greater than 20.0 ng per mL. Cardiac catheterization demonstrates severe three-vessel CAD. 10 years ago the patient underwent treatment with PCI and drug-eluting stents at [facility] for acute MI. He had stents placed to the LAD right and also the circumflex. He is been on Plavix daily since then.  Cardiac catheterization demonstrates high-grade 99% stenosis of the proximal LAD with some diffuse distal disease. The right coronary has 99% stenosis with diffuse distal disease to the posterior descending.. The circumflex has a patent proximal stent. EF is 20%. LVEDP was 24. 2-D echocardiogram is pending.  Current Activity/ Functional Status: Patient is retired is being a Naval architect. He can still walk despite left total hip replacement and right knee replacement. He has not smoked for years.   Zubrod Score: At the time of surgery this patient's most appropriate activity status/level should be described as: []  Normal activity, no symptoms [x]  Symptoms, fully ambulatory []  Symptoms, in bed less than or equal to 50% of the time []  Symptoms, in bed greater than 50% of the time but less than 100% []  Bedridden []  Moribund  Past Medical History  Diagnosis Date  . Coronary artery disease   . Diabetes mellitus without complication   .  Hypertension   . Sleep apnea t  . Prostate cancer t    Past Surgical History  Procedure Laterality Date  . Joint replacement    . Cardiac surgery      History  Smoking status  . Former Smoker  . Types: Cigarettes  Smokeless tobacco  . Never Used    History  Alcohol Use No    History   Social History  . Marital Status: Married    Spouse Name: N/A    Number of Children: N/A  . Years of Education: N/A   Occupational History  . Not on file.   Social History Main Topics  . Smoking status: Former Smoker    Types: Cigarettes  . Smokeless tobacco: Never Used  . Alcohol Use: No  . Drug Use: No  . Sexual Activity: Not Currently   Other Topics Concern  . Not on file   Social History Narrative  . No narrative on file    No Known Allergies  Current Facility-Administered Medications  Medication Dose Route Frequency Provider Last Rate Last Dose  . 0.9 %  sodium chloride infusion   Intravenous Continuous Runell Gess, MD 75 mL/hr at 01/26/13 1414    . acetaminophen (TYLENOL) tablet 650 mg  650 mg Oral Q4H PRN Wilburt Finlay, PA-C      . aspirin chewable tablet 81 mg  81 mg Oral Daily Runell Gess, MD      . heparin ADULT infusion 100 units/mL (25000 units/250  mL)  1,300 Units/hr Intravenous Continuous Gardner Candle, RPH 13 mL/hr at 01/26/13 1549 1,300 Units/hr at 01/26/13 1549  . insulin aspart (novoLOG) injection 0-15 Units  0-15 Units Subcutaneous TID WC Wilburt Finlay, PA-C   8 Units at 01/26/13 1732  . insulin glargine (LANTUS) injection 14 Units  14 Units Subcutaneous QHS Kerin Perna, MD      . Melene Muller ON 01/27/2013] magnesium oxide (MAG-OX) tablet 400 mg  400 mg Oral Daily Marykay Lex, MD      . metoprolol succinate (TOPROL-XL) 24 hr tablet 200 mg  200 mg Oral Daily Wilburt Finlay, PA-C   200 mg at 01/25/13 1625  . morphine 2 MG/ML injection 1 mg  1 mg Intravenous Q1H PRN Runell Gess, MD      . nitroGLYCERIN 0.2 mg/mL in dextrose 5 % infusion  2-200  mcg/min Intravenous Titrated Wilburt Finlay, PA-C 3 mL/hr at 01/26/13 1445 10 mcg/min at 01/26/13 1445  . ondansetron (ZOFRAN) injection 4 mg  4 mg Intravenous Q6H PRN Wilburt Finlay, PA-C   4 mg at 01/26/13 1453  . pantoprazole (PROTONIX) EC tablet 40 mg  40 mg Oral BID Wilburt Finlay, PA-C   40 mg at 01/26/13 1008  . ramipril (ALTACE) capsule 20 mg  20 mg Oral Daily Wilburt Finlay, PA-C   20 mg at 01/25/13 1625  . simvastatin (ZOCOR) tablet 40 mg  40 mg Oral Daily Wilburt Finlay, PA-C   40 mg at 01/25/13 1623    Prescriptions prior to admission  Medication Sig Dispense Refill  . calcium elemental as carbonate (TUMS ULTRA 1000) 400 MG tablet Chew 1,000 mg by mouth as needed for heartburn.       . clopidogrel (PLAVIX) 75 MG tablet Take 75 mg by mouth daily with breakfast.      . glimepiride (AMARYL) 1 MG tablet Take 1 mg by mouth daily with breakfast.      . metFORMIN (GLUCOPHAGE) 500 MG tablet Take 500 mg by mouth 2 (two) times daily with a meal.      . metoprolol (TOPROL-XL) 200 MG 24 hr tablet Take 200 mg by mouth daily.      . pantoprazole (PROTONIX) 40 MG tablet Take 40 mg by mouth 2 (two) times daily.       . ramipril (ALTACE) 10 MG capsule Take 20 mg by mouth daily.      . simethicone (GAS-X EXTRA STRENGTH) 125 MG chewable tablet Chew 125 mg by mouth every 6 (six) hours as needed for flatulence.       . simvastatin (ZOCOR) 40 MG tablet Take 40 mg by mouth daily.        Family History  Problem Relation Age of Onset  . Heart attack Mother   . Heart attack Sister   . Heart attack Brother    family history positive for diabetes and hypertension   Review of Systems:     Cardiac Review of Systems: Y or N  Chest Pain [Y.    ]  Resting SOB [ NN  ] Exertional SOB  [ Y. ]  Orthopnea [  ]   Pedal Edema [N.]    Palpitations [ Y. ] Syncope  [N.  ]   Presyncope [N.   ]  General Review of Systems: [Y] = yes [  ]=no Constitional: recent weight change [  ]; anorexia [  ]; fatigue [  ]; nausea [  ]; night  sweats [  ]; fever [  ];  or chills [  ]                                                               Dental: poor dentition[Y.  ]; Last Dentist visit: Greater than one year, several loose teeth present   Eye : blurred vision [  ]; diplopia [   ]; vision changes [  ];  Amaurosis fugax[  ]; Resp: cough [  ];  wheezing[  ];  hemoptysis[  ]; shortness of breath[ Y. ]; paroxysmal nocturnal dyspnea[Y.  ]; dyspnea on exertion[Y.  ]; or orthopnea[ Y. ]; history of sleep apnea on CPAP GI:  gallstones[  ], vomiting[  ];  dysphagia[  ]; melena[  ];  hematochezia [  ]; heartburn[  ];   Hx of  Colonoscopy[  ]; GU: kidney stones [  ]; hematuria[  ];   dysuria [  ];  nocturia[  ];  history of     obstruction [  ]; urinary frequency [  ]             Skin: rash, swelling[  ];, hair loss[  ];  peripheral edema[  ];  or itching[  ]; Musculosketetal: myalgias[  ];  joint swelling[  ];  joint erythema[  ];  joint pain[  ];  back pain[  ];  Heme/Lymph: bruising[ Y.-on Plavix ];  bleeding[  ];  anemia[  ];  Neuro: TIA[  ];  headaches[  ];  stroke[  ];  vertigo[  ];  seizures[  ];   paresthesias[  ];  difficulty walking[  ];  Psych:depression[  ]; anxiety[  ];  Endocrine: diabetes[Y.  ];  thyroid dysfunction[  ];  Immunizations: Flu [  ]; Pneumococcal[  ];  Other:  Physical Exam: BP 123/72  Pulse 68  Temp(Src) 99.2 F (37.3 C) (Oral)  Resp 21  Ht 5\' 6"  (1.676 m)  Wt 236 lb 15.9 oz (107.5 kg)  BMI 38.27 kg/m2  SpO2 64%  Gen.-72 year old obese male in no acute distress HEENT-poor dentition pupils equal Neck-no JVD mass or carotid bruit   thorax-distant breath sounds Cardiac-regular rhythm without murmur or gallop Abdomen-obese soft nontender without pulsatile mass Extremities-no clubbing or significant edema, no tenderness, compression device over right wrist radial artery catheterization, surgical scars from left hip replacement right knee surgery Neurologic-no focal motor deficit Vascular-nonpalpable  pedal pulses  Studies & Laboratory data:    carotid duplex shows no significant carotid disease 2-D echocardiogram pending PFTs pending hemoglobin A1c greater than 10.0   Recent Radiology Findings:   Dg Chest 2 View  01/25/2013   CLINICAL DATA:  Chest pain and shortness of breath for 1 week. Hypertension. Ex-smoker.  EXAM: CHEST  2 VIEW  COMPARISON:  None.  FINDINGS: Lateral view degraded by patient arm position. Moderate lower thoracic spondylosis. Midline trachea. Moderate cardiomegaly. Mild right hemidiaphragm elevation. No pleural effusion or pneumothorax. No congestive failure. Clear lungs.  IMPRESSION: Cardiomegaly, without congestive failure or acute disease.   Electronically Signed   By: Jeronimo Greaves M.D.   On: 01/25/2013 09:52      Recent Lab Findings: Lab Results  Component Value Date   WBC 6.3 01/26/2013   HGB 14.3 01/26/2013   HCT 40.4 01/26/2013   PLT 141* 01/26/2013  GLUCOSE 244* 01/26/2013   CHOL 150 01/26/2013   TRIG 127 01/26/2013   HDL 39* 01/26/2013   LDLCALC 86 01/26/2013   NA 138 01/26/2013   K 3.6 01/26/2013   CL 100 01/26/2013   CREATININE 0.88 01/26/2013   BUN 10 01/26/2013   CO2 26 01/26/2013   TSH 0.545 01/25/2013   INR 1.09 01/26/2013   HGBA1C 10.7* 01/25/2013      Assessment / Plan:     72 year old male with recent significant non-ST elevation MI and severe multivessel CAD and a diabetic pattern. Not a candidate for PCI. Best long-term therapy would be surgical coronary revascularization which I discussed with the patient and he will consider. We'll check platelet function assay-P2 Y. 12 2 assess Plavix activity. Surgery can be scheduled hopefully for later this week. Pending results of platelet studies. Will follow.        @ME1 @ 01/26/2013 7:02 PM

## 2013-01-26 NOTE — Progress Notes (Signed)
01/26/2013 1345 TR Band removed and dssg applied, Instructions given.

## 2013-01-26 NOTE — Care Management Note (Addendum)
    Page 1 of 1   02/04/2013     1:27:06 PM   CARE MANAGEMENT NOTE 02/04/2013  Patient:  Earl Williamson, Earl Williamson   Account Number:  [id number]  Date Initiated:  01/26/2013  Documentation initiated by:  Junius Creamer  Subjective/Objective Assessment:   adm w mi     Action/Plan:   lives w wife   Anticipated DC Date:  02/05/2013   Anticipated DC Plan:  HOME W HOME HEALTH SERVICES      DC Planning Services  CM consult      Choice offered to / List presented to:     DME arranged  Levan Hurst      DME agency  [facility]        Status of service:  In process, will continue to follow Medicare Important Message given?   (If response is "NO", the following Medicare IM given date fields will be blank) Date Medicare IM given:   Date Additional Medicare IM given:    Discharge Disposition:    Per UR Regulation:  Reviewed for med. necessity/level of care/duration of stay  If discussed at Long Length of Stay Meetings, dates discussed:   02/03/2013    Comments:  02/04/13 Christoher Drudge,RN,BSN [phone] PT NEEDS RW FOR HOME.  REFERRAL TO [facility] FOR DME NEEDS.  02/02/13 4540 Verdis Prime RN MSN BSN CCM Admitted to 2S s/p CABG on 11/21.  Ambulated with staff today on 2L/min [place].

## 2013-01-26 NOTE — CV Procedure (Signed)
Earl Williamson is a 72 y.o. male    [id number] LOCATION:  FACILITY: [facility]  PHYSICIAN: Nanetta Batty, M.D. 1940/07/29   DATE OF PROCEDURE:  01/26/2013  DATE OF DISCHARGE:     CARDIAC CATHETERIZATION     History obtained from chart review.Mr. Rosey Bath is a 72 year old moderately overweight married African American male who presented last night with prolonged chest pain. He has a history of CAD status post stenting of his proximal RCA, mid circumflex and LAD back in 2005 at [facility]. This was in the setting of an acute inferoposterolateral myocardial infarction secondary to thrombotic occlusion of a dominant RCA. He had drug-eluting stents placed. His other problems include continued tobacco abuse, diabetes and hypertension. He is on Plavix. He had chest pain off-and-on for the last 2 weeks which he thought was indigestion and prolonged chest pain last night. There was a suggestion of subtle inferior ST elevation however the patient became pain-free and was treated medically. His troponins increased to greater than 20 and his anterolateral T waves became deeply inverted. He presents now for cardiac catheterization to define his anatomy   PROCEDURE DESCRIPTION:   The patient was brought to the second floor [facility]iac cath lab in the postabsorptive state. He was premedicated with Valium 5 mg by mouth, IV Versed and fentanyl. His right wrist was prepped and shaved in usual sterile fashion. Xylocaine 1% was used for local anesthesia. A 5 French sheath was inserted into the right radial artery using standard Seldinger technique. The patient received 6000 units  of heparin  intravenously.  A 5 [place] JL 3.5 diagnostic catheter and pigtail catheters were used for selective artery angiography and left ventriculography respectively. Visipaque dye was used for the entirety of the case. Retrograde aortic, left ventricular and pressures were recorded.    HEMODYNAMICS:    AO  SYSTOLIC/AO DIASTOLIC: 126/72   LV SYSTOLIC/LV DIASTOLIC: 127/28  ANGIOGRAPHIC RESULTS:   1. Left main; normal  2. LAD; 95% long diffuse calcified proximal. The stent in the mid LAD had 50% proximal in-stent restenosis followed by a 95% stenosis just distal to this. The apical LAD did not fill 3. Left circumflex; codominant with a patent stent in the mid AV groove just beyond the second marginal branch. The first marginal branch was small and had a 95% proximal stenosis.  4. Right coronary artery; codominant with an anterior takeoff. The proximal stent traversed the the proximal bend at 60-70% in-stent restenosis. The mid right had a 90% calcified stenosis in sequence with a 60% stenosis. The PDA had tandem 99% stenoses. The PDA did fill by faint left-to-right collaterals as well 5. Left ventriculography; RAO left ventriculogram was performed using  25 mL of Visipaque dye at 12 mL/second. The overall LVEF estimated  20 %  With wall motion abnormalities notable for severe inferior hypokinesia to akinesia, apical dyskinesia and moderate to severe anterior hypokinesia. The LV appeared dilated  IMPRESSION:severe three-vessel disease involving the proximal and mid LAD, proximal mid and distal right with severe LV dysfunction. I'm going to get a 2-D echocardiogram to further quantify his LV function and valvular pathology. Her to get TCTS to evaluate him for a surgical candidate. The radial sheath was removed and a PR pandas placed on the right wrist chief patent hemostasis. The patient left the Cath Lab in stable condition. I'm going to restart heparin IV.  Runell Gess MD, [facility] 01/26/2013 11:48 AM

## 2013-01-26 NOTE — Progress Notes (Signed)
PHARMACY FOLLOW UP NOTE   Pharmacy Consult for : Heparin Indication: NSTEMI   Dosing Weight: 94 KG  Labs:  Recent Labs  01/25/13 0935 01/25/13 1935 01/26/13 0335 01/26/13 2213  HGB 15.8  --  14.3  --   HCT 44.2  --  40.4  --   PLT 142*  --  141*  --   APTT 49*  --   --   --   LABPROT 13.3  --  13.9  --   INR 1.03  --  1.09  --   HEPARINUNFRC  --  0.44 0.35 0.21*  CREATININE 0.97  --  0.88  --    Lab Results  Component Value Date   INR 1.09 01/26/2013   INR 1.03 01/25/2013    Estimated Creatinine Clearance: 88.5 ml/min (by C-G formula based on Cr of 0.88).  Pertinent Medications:  Scheduled:  . aspirin  81 mg Oral Daily  . insulin aspart  0-15 Units Subcutaneous TID WC  . insulin glargine  14 Units Subcutaneous QHS  . [START ON 01/27/2013] magnesium oxide  400 mg Oral Daily  . metoprolol  200 mg Oral Daily  . pantoprazole  40 mg Oral BID  . ramipril  20 mg Oral Daily  . simvastatin  40 mg Oral Daily   Infusions:  . heparin 1,300 Units/hr (01/26/13 1549)  . nitroGLYCERIN 10 mcg/min (01/26/13 1445)    Assessment: 72 yo male with hx CAD admitted with chest pain x 2 weeks.  Started on IV heparin per pharmacy and taken to cath lab today.  Cath revealed 3V CAD, and TCTS consult ordered.  Pharmacy asked to resume heparin 2 hrs after TR band removed.    PM HL = 0.21  Goal of Therapy: Heparin level 0.3-0.7 Monitor platelets by anticoagulation protocol: Yes  Plan: 1. Increase heparin to 1500 units / hr 2. Follow up AM labs  Thank you. Okey Regal, PharmD [phone]   01/26/2013 10:47 PM

## 2013-01-26 NOTE — Progress Notes (Signed)
PHARMACY FOLLOW UP NOTE   Pharmacy Consult for : Heparin Indication: NSTEMI   Dosing Weight: 94 KG  Labs:  Recent Labs  01/25/13 0935 01/25/13 1935 01/26/13 0335  HGB 15.8  --  14.3  HCT 44.2  --  40.4  PLT 142*  --  141*  APTT 49*  --   --   LABPROT 13.3  --  13.9  INR 1.03  --  1.09  HEPARINUNFRC  --  0.44 0.35  CREATININE 0.97  --  0.88   Lab Results  Component Value Date   INR 1.09 01/26/2013   INR 1.03 01/25/2013    Estimated Creatinine Clearance: 97.7 ml/min (by C-G formula based on Cr of 0.88).  Pertinent Medications:  Scheduled:  . aspirin  81 mg Oral Daily  . insulin aspart  0-15 Units Subcutaneous TID WC  . magnesium oxide  400 mg Oral Daily  . metoprolol  200 mg Oral Daily  . pantoprazole  40 mg Oral BID  . ramipril  20 mg Oral Daily  . simvastatin  40 mg Oral Daily   Infusions:  . sodium chloride    . nitroGLYCERIN 10 mcg/min (01/26/13 1333)    Assessment: 72 yo male with hx CAD admitted with chest pain x 2 weeks.  Started on IV heparin per pharmacy and taken to cath lab today.  Cath revealed 3V CAD, and TCTS consult ordered.  Pharmacy asked to resume heparin 2 hrs after TR band removed.  Will be removed at 2 PM.  Heparin level therapeutic this morning before cath on 1300 units/hr.  No bleeding or complications noted.  Goal of Therapy: Heparin level 0.3-0.7 Monitor platelets by anticoagulation protocol: Yes  Plan: 1. At 4 PM will resume heparin at 1300 units/hr without bolus. 2. Check heparin level 6 hrs after gtt resumed. 3. Daily heparin level and CBC. 4. F/U plans for [facility].  Tad Moore, BCPS  Clinical Pharmacist Pager [phone]  01/26/2013 1:48 PM

## 2013-01-26 NOTE — Progress Notes (Addendum)
VASCULAR LAB PRELIMINARY  PRELIMINARY  PRELIMINARY  PRELIMINARY  Pre-op Cardiac Surgery  Carotid Findings:  Bilateral:  1-39% ICA stenosis.  Vertebral artery flow is antegrade.      Thereasa Parkin, RVT 01/26/2013 2:29 PM    Upper Extremity Right Left  Brachial Pressures 133 Triphasic  142 Triphasic   Radial Waveforms Triphasic  Triphasic   Ulnar Waveforms Triphasic  Triphasic   Palmar Arch (Allen's Test) Within normal limits  Within normal limits      Lower  Extremity Right Left  Dorsalis Pedis 121 Biphasic  151  Biphasic   Anterior Tibial    Posterior Tibial 105  Biphasic  144  Monophasic   Ankle/Brachial Indices 0.85 1.06    Kamariya Blevens, RVT 01/27/2013 12:35 PM

## 2013-01-26 NOTE — H&P (View-Only) (Signed)
  Subjective: Pain free  Objective: Vital signs in last 24 hours: Temp:  [98 F (36.7 C)-98.2 F (36.8 C)] 98.2 F (36.8 C) (11/17 0000) Pulse Rate:  [60-134] 63 (11/17 0700) Resp:  [11-24] 20 (11/17 0700) BP: (104-175)/(54-121) 139/70 mmHg (11/17 0700) SpO2:  [96 %-100 %] 99 % (11/17 0700) Weight:  [230 lb (104.327 kg)-283 lb 4.7 oz (128.5 kg)] 283 lb 4.7 oz (128.5 kg) (11/16 1505) Last BM Date: 01/24/13  Intake/Output from previous day: 11/16 0701 - 11/17 0700 In: 338.5 [P.O.:240; I.V.:98.5] Out: 2350 [Urine:2350] Intake/Output this shift:    Medications Current Facility-Administered Medications  Medication Dose Route Frequency Provider Last Rate Last Dose  . 0.9 %  sodium chloride infusion   Intravenous Continuous Bryan Hager, PA-C 10 mL/hr at 01/26/13 0700    . 0.9 %  sodium chloride infusion  250 mL Intravenous PRN Bryan Hager, PA-C      . acetaminophen (TYLENOL) tablet 650 mg  650 mg Oral Q4H PRN Bryan Hager, PA-C      . clopidogrel (PLAVIX) tablet 75 mg  75 mg Oral Q breakfast Bryan Hager, PA-C   75 mg at 01/26/13 0637  . glimepiride (AMARYL) tablet 1 mg  1 mg Oral Q breakfast Bryan Hager, PA-C      . heparin ADULT infusion 100 units/mL (25000 units/250 mL)  1,300 Units/hr Intravenous Continuous Michelle T Bell, RPH 13 mL/hr at 01/26/13 0700 1,300 Units/hr at 01/26/13 0700  . influenza vac split quadrivalent PF (FLUARIX) injection 0.5 mL  0.5 mL Intramuscular Tomorrow-1000 Bryan Hager, PA-C      . insulin aspart (novoLOG) injection 0-15 Units  0-15 Units Subcutaneous TID WC Bryan Hager, PA-C   2 Units at 01/25/13 1627  . metoprolol succinate (TOPROL-XL) 24 hr tablet 200 mg  200 mg Oral Daily Bryan Hager, PA-C   200 mg at 01/25/13 1625  . nitroGLYCERIN 0.2 mg/mL in dextrose 5 % infusion  2-200 mcg/min Intravenous Titrated Bryan Hager, PA-C 6 mL/hr at 01/26/13 0700 20 mcg/min at 01/26/13 0700  . ondansetron (ZOFRAN) injection 4 mg  4 mg Intravenous Q6H PRN Bryan Hager,  PA-C      . pantoprazole (PROTONIX) EC tablet 40 mg  40 mg Oral BID Bryan Hager, PA-C   40 mg at 01/25/13 2216  . pneumococcal 23 valent vaccine (PNU-IMMUNE) injection 0.5 mL  0.5 mL Intramuscular Tomorrow-1000 Bryan Hager, PA-C      . ramipril (ALTACE) capsule 20 mg  20 mg Oral Daily Bryan Hager, PA-C   20 mg at 01/25/13 1625  . simvastatin (ZOCOR) tablet 40 mg  40 mg Oral Daily Bryan Hager, PA-C   40 mg at 01/25/13 1623  . sodium chloride 0.9 % injection 3 mL  3 mL Intravenous Q12H Bryan Hager, PA-C   3 mL at 01/25/13 2216  . sodium chloride 0.9 % injection 3 mL  3 mL Intravenous PRN Bryan Hager, PA-C        PE: General appearance: alert, cooperative and no distress Lungs: clear to auscultation bilaterally Heart: regular rate and rhythm, S1, S2 normal, no murmur, click, rub or gallop Abdomen: +BS, Nontender Extremities: No LEE Pulses: 2+ and symmetric Neurologic: Grossly normal  Lab Results:   Recent Labs  01/25/13 0935 01/26/13 0335  WBC 4.9 6.3  HGB 15.8 14.3  HCT 44.2 40.4  PLT 142* 141*   BMET  Recent Labs  01/25/13 0935 01/26/13 0335  NA 136 138  K 3.7 3.6  CL 98 100    CO2 21 26  GLUCOSE 283* 244*  BUN 11 10  CREATININE 0.97 0.88  CALCIUM 9.5 8.9   PT/INR  Recent Labs  01/25/13 0935 01/26/13 0335  LABPROT 13.3 13.9  INR 1.03 1.09   Cholesterol  Recent Labs  01/26/13 0335  CHOL 150   Lipid Panel     Component Value Date/Time   CHOL 150 01/26/2013 0335   TRIG 127 01/26/2013 0335   HDL 39* 01/26/2013 0335   CHOLHDL 3.8 01/26/2013 0335   VLDL 25 01/26/2013 0335   LDLCALC 86 01/26/2013 0335   Cardiac Panel (last 3 results)  Recent Labs  01/25/13 1610 01/25/13 1935 01/26/13 0335  TROPONINI >20.00* >20.00* >20.00*    Assessment/Plan  Principal Problem:   NSTEMI (non-ST elevated myocardial infarction) Active Problems:   CAD S/P percutaneous coronary angioplasty - PCI  x 3 stents @ [facility]   DM type 2 (diabetes mellitus, type 2)   HTN  (hypertension), benign   HLD (hyperlipidemia)   Thrombocytopenia  Plan: Pain free.  Troponin >20!  Left heart cath today.  BP and HR stable.     LOS: 1 day    HAGER, BRYAN 01/26/2013 8:07 AM   Agree with note written by Bryan Hagar PAC  S/P NSTEMI with Trop > 20. H/O Stent X 3 at [facility] 2005. Symptomatic for the past week. Currently pain free on iv hep/NTG. EKG shows evolution of Anterolateral TWI as well as a hint of Inf ST elevation . Exam benign. Plan cor angio today radially.   Ambre Kobayashi J 01/26/2013 8:44 AM     

## 2013-01-26 NOTE — Progress Notes (Signed)
PHARMACY FOLLOW UP NOTE   Pharmacy Consult for : Heparin Indication: NSTEMI   Dosing Weight: 94 KG  Labs:  Recent Labs  01/25/13 0935 01/25/13 1935 01/26/13 0335  HGB 15.8  --  14.3  HCT 44.2  --  40.4  PLT 142*  --  141*  APTT 49*  --   --   LABPROT 13.3  --  13.9  INR 1.03  --  1.09  HEPARINUNFRC  --  0.44 0.35  CREATININE 0.97  --  0.88   Lab Results  Component Value Date   INR 1.09 01/26/2013   INR 1.03 01/25/2013    Estimated Creatinine Clearance: 97.7 ml/min (by C-G formula based on Cr of 0.88).  Pertinent Medications:  Scheduled:  . clopidogrel  75 mg Oral Q breakfast  . glimepiride  1 mg Oral Q breakfast  . insulin aspart  0-15 Units Subcutaneous TID WC  . metoprolol  200 mg Oral Daily  . pantoprazole  40 mg Oral BID  . ramipril  20 mg Oral Daily  . simvastatin  40 mg Oral Daily  . sodium chloride  3 mL Intravenous Q12H   Infusions:  . sodium chloride 10 mL/hr at 01/26/13 0700  . heparin 1,300 Units/hr (01/26/13 0700)  . nitroGLYCERIN 20 mcg/min (01/26/13 0700)    Assessment:  72 y/o male on Heparin infusion for NSTEMI w/ + Troponin  Heparin rate 1300 units/hr.  Heparin level 0.35, within therapeutic range.  Hgb/Platelets stable with No bleeding complications noted   Goal:  Heparin level 0.3-0.7 units/ml   Plan: 1. Continue Heparin at 1300 units/hr 2. Daily Heparin Levels, CBC.  Monitor for bleeding complications    Rayford Halsted.D 01/26/2013, 10:42 AM

## 2013-01-27 ENCOUNTER — Encounter (HOSPITAL_COMMUNITY): Payer: Self-pay

## 2013-01-27 DIAGNOSIS — I517 Cardiomegaly: Secondary | ICD-10-CM

## 2013-01-27 DIAGNOSIS — Z01810 Encounter for preprocedural cardiovascular examination: Secondary | ICD-10-CM

## 2013-01-27 HISTORY — PX: TRANSTHORACIC ECHOCARDIOGRAM: SHX275

## 2013-01-27 LAB — GLUCOSE, CAPILLARY
Glucose-Capillary: 204 mg/dL — ABNORMAL HIGH (ref 70–99)
Glucose-Capillary: 181 mg/dL — ABNORMAL HIGH (ref 70–99)
Glucose-Capillary: 176 mg/dL — ABNORMAL HIGH (ref 70–99)
Glucose-Capillary: 229 mg/dL — ABNORMAL HIGH (ref 70–99)

## 2013-01-27 LAB — CBC
Hemoglobin: 13.9 g/dL (ref 13.0–17.0)
MCH: 29 pg (ref 26.0–34.0)
MCV: 83.5 fL (ref 78.0–100.0)
Platelets: 134 10*3/uL — ABNORMAL LOW (ref 150–400)
RBC: 4.79 MIL/uL (ref 4.22–5.81)
RDW: 13.6 % (ref 11.5–15.5)
WBC: 6.1 10*3/uL (ref 4.0–10.5)

## 2013-01-27 LAB — HEPARIN LEVEL (UNFRACTIONATED): Heparin Unfractionated: 0.43 IU/mL (ref 0.30–0.70)

## 2013-01-27 LAB — COMPREHENSIVE METABOLIC PANEL
ALT: 27 U/L (ref 0–53)
AST: 58 U/L — ABNORMAL HIGH (ref 0–37)
Albumin: 3.6 g/dL (ref 3.5–5.2)
Alkaline Phosphatase: 61 U/L (ref 39–117)
BUN: 10 mg/dL (ref 6–23)
CO2: 20 mEq/L (ref 19–32)
Calcium: 8.8 mg/dL (ref 8.4–10.5)
Chloride: 99 mEq/L (ref 96–112)
Creatinine, Ser: 0.98 mg/dL (ref 0.50–1.35)
GFR calc Af Amer: >90 mL/min (ref >90)
GFR calc non Af Amer: 81 mL/min — ABNORMAL LOW (ref >90)
Glucose, Bld: 191 mg/dL — ABNORMAL HIGH (ref 70–99)
Potassium: 3.7 mEq/L (ref 3.5–5.1)
Sodium: 136 mEq/L (ref 135–145)
Total Bilirubin: 1.4 mg/dL — ABNORMAL HIGH (ref 0.3–1.2)
Total Protein: 7.1 g/dL (ref 6.0–8.3)

## 2013-01-27 LAB — CK TOTAL AND CKMB (NOT AT ARMC)
CK, MB: 9.1 ng/mL (ref 0.3–4.0)
Relative Index: 1.7 (ref 0.0–2.5)
Total CK: 547 U/L — ABNORMAL HIGH (ref 7–232)

## 2013-01-27 LAB — PLATELET INHIBITION P2Y12: Platelet Function  P2Y12: 195 [PRU] (ref 194–418)

## 2013-01-27 MED ORDER — INSULIN GLARGINE 100 UNIT/ML ~~LOC~~ SOLN
14.0000 [IU] | Freq: Two times a day (BID) | SUBCUTANEOUS | Status: DC
  Administered 2013-01-27 – 2013-01-29 (×5): 14 [IU] via SUBCUTANEOUS
  Filled 2013-01-27 (×7): qty 0.14

## 2013-01-27 MED ORDER — SODIUM CHLORIDE 0.9 % IV SOLN
INTRAVENOUS | Status: DC | PRN
  Administered 2013-01-27: 20:00:00 via INTRAVENOUS

## 2013-01-27 NOTE — Progress Notes (Signed)
PHARMACY FOLLOW UP NOTE   Pharmacy Consult for : Heparin Indication: NSTEMI   Dosing Weight: 94 KG  Labs:  Recent Labs  01/25/13 0935 01/25/13 1935 01/26/13 0335 01/26/13 2213 01/27/13 0430  HGB 15.8  --  14.3  --  13.9  HCT 44.2  --  40.4  --  40.0  PLT 142*  --  141*  --  134*  APTT 49*  --   --   --   --   LABPROT 13.3  --  13.9  --   --   INR 1.03  --  1.09  --   --   HEPARINUNFRC  --  0.44 0.35 0.21* 0.43  CREATININE 0.97  --  0.88  --  0.98   Lab Results  Component Value Date   INR 1.09 01/26/2013   INR 1.03 01/25/2013    Estimated Creatinine Clearance: 79.7 ml/min (by C-G formula based on Cr of 0.98).  Pertinent Medications:  Scheduled:  . aspirin  81 mg Oral Daily  . insulin aspart  0-15 Units Subcutaneous TID WC  . insulin glargine  14 Units Subcutaneous QHS  . magnesium oxide  400 mg Oral Daily  . metoprolol  200 mg Oral Daily  . pantoprazole  40 mg Oral BID  . ramipril  20 mg Oral Daily  . simvastatin  40 mg Oral Daily   Infusions:  . heparin 1,500 Units/hr (01/26/13 2247)  . nitroGLYCERIN 10 mcg/min (01/26/13 1445)    Assessment: 72 yo male admitted 01/25/2013   with hx CAD admitted with Pharmacy consulted to dose heparin.  Events: gas, GI upset last night, TCTS discussing CABG  AC: NSTEMI;  heparin - level therapeutic on 1500/hr this AM. CBC gradually trending down. No bleeding noted  Best Practices: Heparin, po PPI   Goal of Therapy: Heparin level 0.3-0.7 Monitor platelets by anticoagulation protocol: Yes  Plan: 1. Continue heparin at 1500 units / hr 2. Follow up AM labs   Thank you for allowing pharmacy to be a part of this patients care team.  Lovenia Kim Pharm.D., BCPS Clinical Pharmacist 01/27/2013 10:32 AM Pager: [phone] Phone: [phone]

## 2013-01-27 NOTE — Progress Notes (Signed)
Pt has brought in his own cpap machine and places himself on/off.  Was informed to call RN if he needed assistance from RT.  Rt will continue to monitor.

## 2013-01-27 NOTE — Progress Notes (Signed)
  Echocardiogram 2D Echocardiogram has been performed.  Arvil Chaco 01/27/2013, 11:19 AM

## 2013-01-27 NOTE — Progress Notes (Signed)
Subjective: Denies CP/SOB. Had some gas/GI upset last night. Currently w/o symptoms.   Objective: Vital signs in last 24 hours: Temp:  [97.4 F (36.3 C)-99.2 F (37.3 C)] 97.5 F (36.4 C) (11/18 0402) Pulse Rate:  [64-85] 74 (11/18 0600) Resp:  [14-29] 29 (11/18 0600) BP: (91-159)/(61-103) 149/71 mmHg (11/18 0600) SpO2:  [83 %-99 %] 95 % (11/18 0600) FiO2 (%):  [21 %] 21 % (11/17 2302) Weight:  [236 lb 15.9 oz (107.5 kg)-238 lb 5.1 oz (108.1 kg)] 238 lb 5.1 oz (108.1 kg) (11/18 0441) Last BM Date: 01/24/13  Intake/Output from previous day: 11/17 0701 - 11/18 0700 In: 1321.5 [P.O.:480; I.V.:841.5] Out: 2675 [Urine:2675] Intake/Output this shift:    Medications Current Facility-Administered Medications  Medication Dose Route Frequency Provider Last Rate Last Dose  . acetaminophen (TYLENOL) tablet 650 mg  650 mg Oral Q4H PRN Wilburt Finlay, PA-C      . aspirin chewable tablet 81 mg  81 mg Oral Daily Runell Gess, MD      . heparin ADULT infusion 100 units/mL (25000 units/250 mL)  1,500 Units/hr Intravenous Continuous Marykay Lex, MD 15 mL/hr at 01/26/13 2247 1,500 Units/hr at 01/26/13 2247  . insulin aspart (novoLOG) injection 0-15 Units  0-15 Units Subcutaneous TID WC Wilburt Finlay, PA-C   8 Units at 01/26/13 1732  . insulin glargine (LANTUS) injection 14 Units  14 Units Subcutaneous QHS Kerin Perna, MD   14 Units at 01/26/13 2152  . magnesium oxide (MAG-OX) tablet 400 mg  400 mg Oral Daily Marykay Lex, MD      . metoprolol succinate (TOPROL-XL) 24 hr tablet 200 mg  200 mg Oral Daily Wilburt Finlay, PA-C   200 mg at 01/25/13 1625  . morphine 2 MG/ML injection 1 mg  1 mg Intravenous Q1H PRN Runell Gess, MD      . nitroGLYCERIN 0.2 mg/mL in dextrose 5 % infusion  2-200 mcg/min Intravenous Titrated Wilburt Finlay, PA-C 3 mL/hr at 01/26/13 1445 10 mcg/min at 01/26/13 1445  . ondansetron (ZOFRAN) injection 4 mg  4 mg Intravenous Q6H PRN Wilburt Finlay, PA-C   4 mg at  01/26/13 2345  . pantoprazole (PROTONIX) EC tablet 40 mg  40 mg Oral BID Wilburt Finlay, PA-C   40 mg at 01/26/13 2152  . ramipril (ALTACE) capsule 20 mg  20 mg Oral Daily Wilburt Finlay, PA-C   20 mg at 01/25/13 1625  . simvastatin (ZOCOR) tablet 40 mg  40 mg Oral Daily Wilburt Finlay, PA-C   40 mg at 01/25/13 1623    PE: General appearance: alert, cooperative and no distress Lungs: clear to auscultation bilaterally Heart: regular rate and rhythm, S1, S2 normal, no murmur, click, rub or gallop Abdomen: soft, non-tender; bowel sounds normal; no masses,  no organomegaly Extremities: no LEE Pulses: 2+ and symmetric Skin: warm and dry Neurologic: Grossly normal  Lab Results:   Recent Labs  01/25/13 0935 01/26/13 0335 01/27/13 0430  WBC 4.9 6.3 6.1  HGB 15.8 14.3 13.9  HCT 44.2 40.4 40.0  PLT 142* 141* 134*   BMET  Recent Labs  01/25/13 0935 01/26/13 0335 01/27/13 0430  NA 136 138 136  K 3.7 3.6 3.7  CL 98 100 99  CO2 21 26 20   GLUCOSE 283* 244* 191*  BUN 11 10 10   CREATININE 0.97 0.88 0.98  CALCIUM 9.5 8.9 8.8   PT/INR  Recent Labs  01/25/13 0935 01/26/13 0335  LABPROT 13.3 13.9  INR  1.03 1.09   Cholesterol  Recent Labs  01/26/13 0335  CHOL 150   Cardiac Panel (last 3 results)  Recent Labs  01/25/13 1610 01/25/13 1935 01/26/13 0335 01/27/13 0430  CKTOTAL  --   --   --  547*  CKMB  --   --   --  9.1*  TROPONINI >20.00* >20.00* >20.00*  --   RELINDX  --   --   --  1.7      Assessment/Plan    Principal Problem:   NSTEMI (non-ST elevated myocardial infarction) Active Problems:   CAD S/P percutaneous coronary angioplasty - PCI  x 3 stents @ [facility]   DM type 2 (diabetes mellitus, type 2)   HTN (hypertension), benign   HLD (hyperlipidemia)  Plan: LHC yesterday revealed severe three-vessel disease involving the proximal and mid LAD, proximal mid and distal right with severe LV dysfunction. 2D echo pending. TCTS consult yesterday. Dr. Donata Clay to meet  with patient and family again today at 6pm to determine if CABG will be done this week, pending platelet studies. Platelets have been downtrending. 134 today. ? HIT. Continue to monitor. MD to determine if heparin should be continued. Continue IV NTG.     LOS: 2 days    Brittainy M. Delmer Islam 01/27/2013 7:51 AM  I have seen and evaluated the patient this AM along with Boyce Medici, PA. I agree with her findings, examination as well as impression recommendations.  LHC yesterday with severe LAD & RCA disease - referred for CABG.  Appreciate Dr. Zenaida Niece Trigt's consultation.  Monitor Platelet level - but would continue IV heparin for now.  No further Plavix. Continue IV NTG & monitor for SSx of CHF given reduced EF (& sizeable infarction).  Echo pending.(will review today).  On High dose BB, ACE-I along with statin. Glycemic control - adequate on Lantus & SSI.   Marykay Lex, M.D., M.S. [facility] [address] [place], [place]  [address]  [phone] Pager # [phone] 01/27/2013

## 2013-01-28 ENCOUNTER — Inpatient Hospital Stay (HOSPITAL_COMMUNITY): Payer: 59

## 2013-01-28 LAB — PULMONARY FUNCTION TEST
FEF 25-75 Post: 1.29 L/sec
FEF 25-75 Pre: 1.98 L/sec
FEF2575-%Change-Post: -35 %
FEF2575-%Pred-Post: 62 %
FEF2575-%Pred-Pre: 95 %
FEV1-%Change-Post: -7 %
FEV1-%Pred-Post: 82 %
FEV1-%Pred-Pre: 89 %
FEV1-Post: 1.97 L
FEV1-Pre: 2.14 L
FEV1FVC-%Change-Post: -3 %
FEV1FVC-%Pred-Pre: 103 %
FEV6-%Change-Post: -5 %
FEV6-%Pred-Post: 83 %
FEV6-%Pred-Pre: 88 %
FEV6-Post: 2.55 L
FEV6-Pre: 2.71 L
FEV6FVC-%Change-Post: -1 %
FEV6FVC-%Pred-Post: 104 %
FEV6FVC-%Pred-Pre: 105 %
FVC-%Change-Post: -4 %
FVC-%Pred-Post: 79 %
FVC-%Pred-Pre: 83 %
FVC-Post: 2.58 L
FVC-Pre: 2.71 L
Post FEV1/FVC ratio: 76 %
Post FEV6/FVC ratio: 99 %
Pre FEV1/FVC ratio: 79 %
Pre FEV6/FVC Ratio: 100 %

## 2013-01-28 LAB — GLUCOSE, CAPILLARY
Glucose-Capillary: 184 mg/dL — ABNORMAL HIGH (ref 70–99)
Glucose-Capillary: 200 mg/dL — ABNORMAL HIGH (ref 70–99)
Glucose-Capillary: 216 mg/dL — ABNORMAL HIGH (ref 70–99)

## 2013-01-28 LAB — HEPARIN LEVEL (UNFRACTIONATED): Heparin Unfractionated: 0.36 IU/mL (ref 0.30–0.70)

## 2013-01-28 LAB — CBC
HCT: 39.7 % (ref 39.0–52.0)
MCHC: 34.3 g/dL (ref 30.0–36.0)
MCV: 84.1 fL (ref 78.0–100.0)
RDW: 13.7 % (ref 11.5–15.5)
WBC: 5.1 10*3/uL (ref 4.0–10.5)

## 2013-01-28 MED ORDER — ALBUTEROL SULFATE (5 MG/ML) 0.5% IN NEBU
2.5000 mg | INHALATION_SOLUTION | Freq: Once | RESPIRATORY_TRACT | Status: AC
  Administered 2013-01-28: 2.5 mg via RESPIRATORY_TRACT

## 2013-01-28 MED ORDER — ALUM & MAG HYDROXIDE-SIMETH 200-200-20 MG/5ML PO SUSP
30.0000 mL | ORAL | Status: DC | PRN
  Administered 2013-01-28: 30 mL via ORAL
  Filled 2013-01-28: qty 30

## 2013-01-28 MED ORDER — DIAZEPAM 5 MG PO TABS
5.0000 mg | ORAL_TABLET | ORAL | Status: DC | PRN
  Filled 2013-01-28: qty 2

## 2013-01-28 NOTE — Progress Notes (Signed)
Subjective: No Complaints.  Objective: Vital signs in last 24 hours: Temp:  [97.7 F (36.5 C)-99.4 F (37.4 C)] 98 F (36.7 C) (11/19 0400) Pulse Rate:  [77-88] 88 (11/18 0900) Resp:  [15-21] 15 (11/18 2351) BP: (79-150)/(50-97) 145/93 mmHg (11/19 0400) SpO2:  [92 %-100 %] 92 % (11/19 0400) Weight:  [237 lb 10.5 oz (107.8 kg)] 237 lb 10.5 oz (107.8 kg) (11/19 0500) Last BM Date: 01/24/13  Intake/Output from previous day: 11/18 0701 - 11/19 0700 In: 558 [I.V.:558] Out: 1225 [Urine:1225] Intake/Output this shift:    Medications Current Facility-Administered Medications  Medication Dose Route Frequency Provider Last Rate Last Dose  . 0.9 %  sodium chloride infusion   Intravenous Continuous PRN Marykay Lex, MD 10 mL/hr at 01/27/13 2013    . acetaminophen (TYLENOL) tablet 650 mg  650 mg Oral Q4H PRN Wilburt Finlay, PA-C      . aspirin chewable tablet 81 mg  81 mg Oral Daily Runell Gess, MD   81 mg at 01/27/13 0901  . heparin ADULT infusion 100 units/mL (25000 units/250 mL)  1,500 Units/hr Intravenous Continuous Marykay Lex, MD 15 mL/hr at 01/27/13 2014 1,500 Units/hr at 01/27/13 2014  . insulin aspart (novoLOG) injection 0-15 Units  0-15 Units Subcutaneous TID WC Wilburt Finlay, PA-C   5 Units at 01/27/13 1704  . insulin glargine (LANTUS) injection 14 Units  14 Units Subcutaneous BID Kerin Perna, MD   14 Units at 01/27/13 2116  . magnesium oxide (MAG-OX) tablet 400 mg  400 mg Oral Daily Marykay Lex, MD   400 mg at 01/27/13 0901  . metoprolol succinate (TOPROL-XL) 24 hr tablet 200 mg  200 mg Oral Daily Wilburt Finlay, PA-C   200 mg at 01/27/13 0901  . morphine 2 MG/ML injection 1 mg  1 mg Intravenous Q1H PRN Runell Gess, MD      . nitroGLYCERIN 0.2 mg/mL in dextrose 5 % infusion  2-200 mcg/min Intravenous Titrated Wilburt Finlay, PA-C   5 mcg/min at 01/27/13 0700  . ondansetron (ZOFRAN) injection 4 mg  4 mg Intravenous Q6H PRN Wilburt Finlay, PA-C   4 mg at 01/26/13  2345  . pantoprazole (PROTONIX) EC tablet 40 mg  40 mg Oral BID Wilburt Finlay, PA-C   40 mg at 01/27/13 2116  . ramipril (ALTACE) capsule 20 mg  20 mg Oral Daily Wilburt Finlay, PA-C   20 mg at 01/27/13 0901  . simvastatin (ZOCOR) tablet 40 mg  40 mg Oral Daily Wilburt Finlay, PA-C   40 mg at 01/27/13 0901    PE: General appearance: alert, cooperative and no distress Lungs: clear to auscultation bilaterally Heart: regular rate and rhythm, S1, S2 normal, no murmur, click, rub or gallop Extremities: No LEE Pulses: 2+ and symmetric Skin: Warm and dry Neurologic: Grossly normal  Lab Results:   Recent Labs  01/26/13 0335 01/27/13 0430 01/28/13 0520  WBC 6.3 6.1 5.1  HGB 14.3 13.9 13.6  HCT 40.4 40.0 39.7  PLT 141* 134* 142*   BMET  Recent Labs  01/25/13 0935 01/26/13 0335 01/27/13 0430  NA 136 138 136  K 3.7 3.6 3.7  CL 98 100 99  CO2 21 26 20   GLUCOSE 283* 244* 191*  BUN 11 10 10   CREATININE 0.97 0.88 0.98  CALCIUM 9.5 8.9 8.8   PT/INR  Recent Labs  01/25/13 0935 01/26/13 0335  LABPROT 13.3 13.9  INR 1.03 1.09   Cholesterol  Recent Labs  01/26/13 0335  CHOL 150   Lipid Panel     Component Value Date/Time   CHOL 150 01/26/2013 0335   TRIG 127 01/26/2013 0335   HDL 39* 01/26/2013 0335   CHOLHDL 3.8 01/26/2013 0335   VLDL 25 01/26/2013 0335   LDLCALC 86 01/26/2013 0335    Assessment/Plan   Principal Problem:   NSTEMI (non-ST elevated myocardial infarction) Active Problems:   CAD S/P percutaneous coronary angioplasty - PCI  x 3 stents @ [facility]   DM type 2 (diabetes mellitus, type 2)   HTN (hypertension), benign   HLD (hyperlipidemia)  Plan:  Severe three-vessel disease involving the proximal and mid LAD, proximal mid and distal right with severe LV dysfunction.  CABG on Friday.  BP and HR controlled.  ASA, Toprol, Ramipril, Zocor.  Consider lifevest at DC given EF of 30-35%.    LOS: 3 days    HAGER, BRYAN 01/28/2013 7:59 AM  I have seen and  examined the patient along with Wilburt Finlay, PA.  I have reviewed the chart, notes and new data.  I agree with PA's note.  Key new complaints: no angina Key examination changes: no clinically evident HF, no arrhythmia Key new findings / data: PRU 195; very prominent T wave inversion laterally, worsened from 11/16  PLAN: Keep in stepdown until CABG Friday. Agree he may need temporary LifeVest, but expect substantial EF improvement after successful surgery, given the high ischemic burden.  Thurmon Fair, MD, [facility] [phone] 01/28/2013, 9:02 AM

## 2013-01-28 NOTE — Progress Notes (Signed)
PHARMACY FOLLOW UP NOTE   Pharmacy Consult for : Heparin Indication: NSTEMI   Dosing Weight: 94 KG  Labs:  Recent Labs  01/25/13 1935 01/26/13 0335 01/26/13 2213 01/27/13 0430 01/28/13 0520  HGB  --  14.3  --  13.9 13.6  HCT  --  40.4  --  40.0 39.7  PLT  --  141*  --  134* 142*  LABPROT  --  13.9  --   --   --   INR  --  1.09  --   --   --   HEPARINUNFRC 0.44 0.35 0.21* 0.43 0.36  CREATININE  --  0.88  --  0.98  --    Lab Results  Component Value Date   INR 1.09 01/26/2013   INR 1.03 01/25/2013    Estimated Creatinine Clearance: 79.6 ml/min (by C-G formula based on Cr of 0.98).  Pertinent Medications:  Scheduled:  . aspirin  81 mg Oral Daily  . insulin aspart  0-15 Units Subcutaneous TID WC  . insulin glargine  14 Units Subcutaneous BID  . magnesium oxide  400 mg Oral Daily  . metoprolol  200 mg Oral Daily  . pantoprazole  40 mg Oral BID  . ramipril  20 mg Oral Daily  . simvastatin  40 mg Oral Daily   Infusions:  . sodium chloride 10 mL/hr at 01/27/13 2013  . heparin 1,500 Units/hr (01/27/13 2014)  . nitroGLYCERIN Stopped (01/27/13 0900)    Assessment: 72 yo male admitted 01/25/2013   with hx CAD admitted with Pharmacy consulted to dose heparin.  NSTEMI;  heparin - level therapeutic on 1500/hr this AM. CBC gradually trending down. No bleeding noted.  Planning for CABG on Friday.  Goal of Therapy: Heparin level 0.3-0.7 Monitor platelets by anticoagulation protocol: Yes  Plan: 1. Continue heparin at 1500 units / hr 2. Follow up AM labs.  Thank you for allowing pharmacy to be a part of this patient's care team.  Tad Moore, BCPS  Clinical Pharmacist Pager [phone]  01/28/2013 11:23 AM

## 2013-01-28 NOTE — Progress Notes (Signed)
CARDIAC REHAB PHASE I   PRE:  Rate/Rhythm: 72 SR  BP:  Supine:   Sitting: 121/24  Standing:    SaO2:   MODE:  Ambulation: 350 ft   POST:  Rate/Rhythm: 82 SR  BP:  Supine:   Sitting: 171/72  Standing:    SaO2:  0905-1020 Assisted X 1 to ambulate. Gait steady. Pt c/o of hip discomfort with not walking in a few days. States when we are walking that he uses a cane at home. Pt took one standing rest stops due to feeling tired and SOB. When pt got back to room states that he felt some burning in his chest. BP up after walk. Pt states that his chest burning went away with rest. Pt to recliner after walk with call light in reach and wife present. Completed pre-op education with pt, wife and granddaughter. Encouraged them to watch [facility] video. Pt does use his arms a lot to get OOB and out of chair. We discussed sternal precautions and how to get OOB and chair without using arms.   Melina Copa RN 01/28/2013 10:23 AM

## 2013-01-29 ENCOUNTER — Encounter (HOSPITAL_COMMUNITY): Payer: Self-pay | Admitting: Certified Registered Nurse Anesthetist

## 2013-01-29 LAB — CBC
HCT: 38.2 % — ABNORMAL LOW (ref 39.0–52.0)
HCT: 42.1 % (ref 39.0–52.0)
Hemoglobin: 14.4 g/dL (ref 13.0–17.0)
MCH: 28.7 pg (ref 26.0–34.0)
MCHC: 34.2 g/dL (ref 30.0–36.0)
MCV: 83.8 fL (ref 78.0–100.0)
MCV: 84 fL (ref 78.0–100.0)
Platelets: 167 10*3/uL (ref 150–400)
RBC: 5.01 MIL/uL (ref 4.22–5.81)
RDW: 13.7 % (ref 11.5–15.5)
RDW: 13.7 % (ref 11.5–15.5)
WBC: 5.3 10*3/uL (ref 4.0–10.5)
WBC: 4.5 10*3/uL (ref 4.0–10.5)

## 2013-01-29 LAB — BASIC METABOLIC PANEL
BUN: 14 mg/dL (ref 6–23)
CO2: 24 mEq/L (ref 19–32)
Calcium: 8.7 mg/dL (ref 8.4–10.5)
Chloride: 101 mEq/L (ref 96–112)
Creatinine, Ser: 0.96 mg/dL (ref 0.50–1.35)
GFR calc Af Amer: >90 mL/min (ref >90)
GFR calc non Af Amer: 81 mL/min — ABNORMAL LOW (ref >90)
Glucose, Bld: 238 mg/dL — ABNORMAL HIGH (ref 70–99)
Potassium: 3.6 mEq/L (ref 3.5–5.1)
Sodium: 137 mEq/L (ref 135–145)

## 2013-01-29 LAB — HEMOGLOBIN A1C
Hgb A1c MFr Bld: 10.5 % — ABNORMAL HIGH (ref <5.7)
Mean Plasma Glucose: 255 mg/dL — ABNORMAL HIGH (ref <117)

## 2013-01-29 LAB — HEPARIN LEVEL (UNFRACTIONATED)
Heparin Unfractionated: 0.22 IU/mL — ABNORMAL LOW (ref 0.30–0.70)
Heparin Unfractionated: 0.34 IU/mL (ref 0.30–0.70)

## 2013-01-29 LAB — PREPARE RBC (CROSSMATCH)

## 2013-01-29 LAB — SURGICAL PCR SCREEN
MRSA, PCR: NEGATIVE
Staphylococcus aureus: NEGATIVE

## 2013-01-29 LAB — ABO/RH: ABO/RH(D): O POS

## 2013-01-29 LAB — GLUCOSE, CAPILLARY: Glucose-Capillary: 161 mg/dL — ABNORMAL HIGH (ref 70–99)

## 2013-01-29 LAB — PLATELET INHIBITION P2Y12: Platelet Function  P2Y12: 224 [PRU] (ref 194–418)

## 2013-01-29 MED ORDER — CHLORHEXIDINE GLUCONATE 4 % EX LIQD
60.0000 mL | Freq: Once | CUTANEOUS | Status: AC
  Administered 2013-01-30: 4 via TOPICAL
  Filled 2013-01-29: qty 60

## 2013-01-29 MED ORDER — DEXTROSE 5 % IV SOLN
1.5000 g | INTRAVENOUS | Status: AC
  Administered 2013-01-30: .75 g via INTRAVENOUS
  Administered 2013-01-30: 1.5 g via INTRAVENOUS
  Filled 2013-01-29: qty 1.5

## 2013-01-29 MED ORDER — DEXTROSE 5 % IV SOLN
30.0000 ug/min | INTRAVENOUS | Status: DC
  Filled 2013-01-29: qty 2

## 2013-01-29 MED ORDER — DEXTROSE 5 % IV SOLN
750.0000 mg | INTRAVENOUS | Status: DC
  Filled 2013-01-29: qty 750

## 2013-01-29 MED ORDER — POLYETHYLENE GLYCOL 3350 17 G PO PACK
17.0000 g | PACK | Freq: Every day | ORAL | Status: DC
  Administered 2013-01-29: 17 g via ORAL
  Filled 2013-01-29 (×2): qty 1

## 2013-01-29 MED ORDER — METOPROLOL TARTRATE 12.5 MG HALF TABLET
12.5000 mg | ORAL_TABLET | Freq: Once | ORAL | Status: DC
  Filled 2013-01-29: qty 1

## 2013-01-29 MED ORDER — ISOSORBIDE MONONITRATE ER 60 MG PO TB24
60.0000 mg | ORAL_TABLET | Freq: Every day | ORAL | Status: DC
  Administered 2013-01-29: 60 mg via ORAL
  Filled 2013-01-29 (×2): qty 1

## 2013-01-29 MED ORDER — MAGNESIUM SULFATE 50 % IJ SOLN
40.0000 meq | INTRAMUSCULAR | Status: DC
  Filled 2013-01-29: qty 10

## 2013-01-29 MED ORDER — NITROGLYCERIN IN D5W 200-5 MCG/ML-% IV SOLN
2.0000 ug/min | INTRAVENOUS | Status: DC
  Filled 2013-01-29: qty 250

## 2013-01-29 MED ORDER — POTASSIUM CHLORIDE 2 MEQ/ML IV SOLN
80.0000 meq | INTRAVENOUS | Status: DC
  Filled 2013-01-29: qty 40

## 2013-01-29 MED ORDER — PLASMA-LYTE 148 IV SOLN
INTRAVENOUS | Status: AC
  Administered 2013-01-30: 08:00:00
  Filled 2013-01-29: qty 2.5

## 2013-01-29 MED ORDER — DIAZEPAM 5 MG PO TABS
5.0000 mg | ORAL_TABLET | Freq: Once | ORAL | Status: AC
  Administered 2013-01-30: 5 mg via ORAL

## 2013-01-29 MED ORDER — CHLORHEXIDINE GLUCONATE 4 % EX LIQD
60.0000 mL | Freq: Once | CUTANEOUS | Status: AC
  Administered 2013-01-30: 4 via TOPICAL

## 2013-01-29 MED ORDER — TEMAZEPAM 15 MG PO CAPS
15.0000 mg | ORAL_CAPSULE | Freq: Every evening | ORAL | Status: AC | PRN
  Administered 2013-01-30: 15 mg via ORAL
  Filled 2013-01-29: qty 1

## 2013-01-29 MED ORDER — VANCOMYCIN HCL 10 G IV SOLR
1500.0000 mg | INTRAVENOUS | Status: AC
  Administered 2013-01-30: 1500 mg via INTRAVENOUS
  Filled 2013-01-29: qty 1500

## 2013-01-29 MED ORDER — BISACODYL 5 MG PO TBEC
5.0000 mg | DELAYED_RELEASE_TABLET | Freq: Once | ORAL | Status: AC
  Administered 2013-01-29: 5 mg via ORAL
  Filled 2013-01-29: qty 1

## 2013-01-29 MED ORDER — HEPARIN (PORCINE) IN NACL 100-0.45 UNIT/ML-% IJ SOLN
1650.0000 [IU]/h | INTRAMUSCULAR | Status: DC
  Administered 2013-01-30: 1650 [IU]/h via INTRAVENOUS
  Filled 2013-01-29: qty 250

## 2013-01-29 MED ORDER — SODIUM CHLORIDE 0.9 % IV SOLN
INTRAVENOUS | Status: DC
  Filled 2013-01-29: qty 30

## 2013-01-29 MED ORDER — EPINEPHRINE HCL 1 MG/ML IJ SOLN
0.5000 ug/min | INTRAVENOUS | Status: DC
  Filled 2013-01-29: qty 4

## 2013-01-29 MED ORDER — SODIUM CHLORIDE 0.9 % IV SOLN
INTRAVENOUS | Status: DC
  Filled 2013-01-29: qty 1

## 2013-01-29 MED ORDER — DOPAMINE-DEXTROSE 3.2-5 MG/ML-% IV SOLN
2.0000 ug/kg/min | INTRAVENOUS | Status: DC
  Filled 2013-01-29: qty 250

## 2013-01-29 MED ORDER — DEXMEDETOMIDINE HCL IN NACL 400 MCG/100ML IV SOLN
0.1000 ug/kg/h | INTRAVENOUS | Status: DC
  Filled 2013-01-29: qty 100

## 2013-01-29 MED ORDER — SODIUM CHLORIDE 0.9 % IV SOLN
INTRAVENOUS | Status: DC
  Filled 2013-01-29: qty 40

## 2013-01-29 NOTE — Progress Notes (Signed)
Subjective: Complaints of acid reflux/burning in chest.  Objective: Vital signs in last 24 hours: Temp:  [97.6 F (36.4 C)-98.1 F (36.7 C)] 98.1 F (36.7 C) (11/20 0739) Pulse Rate:  [58-68] 68 (11/20 0739) Resp:  [16-22] 16 (11/20 0739) BP: (97-157)/(46-90) 149/81 mmHg (11/20 0739) SpO2:  [98 %-100 %] 99 % (11/20 0739) Weight:  [239 lb 10.2 oz (108.7 kg)] 239 lb 10.2 oz (108.7 kg) (11/20 0500) Last BM Date: 01/25/13  Intake/Output from previous day: 11/19 0701 - 11/20 0700 In: 645 [P.O.:350; I.V.:295] Out: 1350 [Urine:1350] Intake/Output this shift: Total I/O In: 200 [P.O.:200] Out: -   Medications Current Facility-Administered Medications  Medication Dose Route Frequency Provider Last Rate Last Dose  . 0.9 %  sodium chloride infusion   Intravenous Continuous PRN Marykay Lex, MD 10 mL/hr at 01/27/13 2013    . acetaminophen (TYLENOL) tablet 650 mg  650 mg Oral Q4H PRN Wilburt Finlay, PA-C      . alum & mag hydroxide-simeth (MAALOX/MYLANTA) 200-200-20 MG/5ML suspension 30 mL  30 mL Oral Q4H PRN Marykay Lex, MD   30 mL at 01/28/13 2046  . aspirin chewable tablet 81 mg  81 mg Oral Daily Runell Gess, MD   81 mg at 01/28/13 0902  . bisacodyl (DULCOLAX) EC tablet 5 mg  5 mg Oral Once Kerin Perna, MD      . chlorhexidine (HIBICLENS) 4 % liquid 4 application  60 mL Topical Once Kerin Perna, MD       And  . Melene Muller ON 01/30/2013] chlorhexidine (HIBICLENS) 4 % liquid 4 application  60 mL Topical Once Kerin Perna, MD      . Melene Muller ON 01/30/2013] diazepam (VALIUM) tablet 5 mg  5 mg Oral Once Kerin Perna, MD      . diazepam (VALIUM) tablet 5-10 mg  5-10 mg Oral Q4H PRN Kerin Perna, MD      . heparin ADULT infusion 100 units/mL (25000 units/250 mL)  1,650 Units/hr Intravenous Continuous Gardner Candle, RPH 16.5 mL/hr at 01/29/13 0815 1,650 Units/hr at 01/29/13 0815  . insulin aspart (novoLOG) injection 0-15 Units  0-15 Units Subcutaneous TID WC Wilburt Finlay, PA-C   3 Units at 01/29/13 0815  . insulin glargine (LANTUS) injection 14 Units  14 Units Subcutaneous BID Kerin Perna, MD   14 Units at 01/28/13 2148  . magnesium oxide (MAG-OX) tablet 400 mg  400 mg Oral Daily Marykay Lex, MD   400 mg at 01/28/13 0902  . metoprolol succinate (TOPROL-XL) 24 hr tablet 200 mg  200 mg Oral Daily Wilburt Finlay, PA-C   200 mg at 01/28/13 0901  . [START ON 01/30/2013] metoprolol tartrate (LOPRESSOR) tablet 12.5 mg  12.5 mg Oral Once Kerin Perna, MD      . morphine 2 MG/ML injection 1 mg  1 mg Intravenous Q1H PRN Runell Gess, MD      . nitroGLYCERIN 0.2 mg/mL in dextrose 5 % infusion  2-200 mcg/min Intravenous Titrated Wilburt Finlay, PA-C   5 mcg/min at 01/27/13 0700  . ondansetron (ZOFRAN) injection 4 mg  4 mg Intravenous Q6H PRN Wilburt Finlay, PA-C   4 mg at 01/26/13 2345  . pantoprazole (PROTONIX) EC tablet 40 mg  40 mg Oral BID Wilburt Finlay, PA-C   40 mg at 01/28/13 2148  . ramipril (ALTACE) capsule 20 mg  20 mg Oral Daily Wilburt Finlay, PA-C   20 mg at 01/28/13 0865  .  simvastatin (ZOCOR) tablet 40 mg  40 mg Oral Daily Wilburt Finlay, PA-C   40 mg at 01/28/13 0902  . temazepam (RESTORIL) capsule 15 mg  15 mg Oral Once PRN Kerin Perna, MD        PE: General appearance: alert, cooperative and no distress  Lungs: clear to auscultation bilaterally  Heart: regular rate and rhythm, S1, S2 normal, no murmur, click, rub or gallop  Extremities: No LEE  Pulses: 2+ and symmetric  Skin: Warm and dry  Neurologic: Grossly normal  Lab Results:   Recent Labs  01/27/13 0430 01/28/13 0520 01/29/13 0500  WBC 6.1 5.1 5.3  HGB 13.9 13.6 13.3  HCT 40.0 39.7 38.2*  PLT 134* 142* 172   BMET  Recent Labs  01/27/13 0430  NA 136  K 3.7  CL 99  CO2 20  GLUCOSE 191*  BUN 10  CREATININE 0.98  CALCIUM 8.8    Assessment/Plan   Principal Problem:   NSTEMI (non-ST elevated myocardial infarction) Active Problems:   CAD S/P percutaneous coronary  angioplasty - PCI  x 3 stents @ [facility]   DM type 2 (diabetes mellitus, type 2)   HTN (hypertension), benign   HLD (hyperlipidemia)  Plan: Severe three-vessel disease involving the proximal and mid LAD, proximal mid and distal right with severe LV dysfunction. CABG on Friday. BP and HR controlled. ASA, Toprol, Ramipril, Zocor. Consider lifevest at DC given EF of 30-35%.  No BM since Sunday.  Will add miralax this morning.  Maalox for indigestion.    LOS: 4 days    HAGER, BRYAN 01/29/2013 8:42 AM     Patient seen and examined. Agree with assessment and plan. Pt had mild chest burning earlier while walking with cardiac rehab. On heparin; no longer on iv NTG apparently stopped last night; will start isosorbide and give now.    Lennette Bihari, MD, [facility] 01/29/2013 11:48 AM

## 2013-01-29 NOTE — Progress Notes (Signed)
PHARMACY FOLLOW UP NOTE   Pharmacy Consult for : Heparin Indication: NSTEMI   Dosing Weight: 94 KG  Labs:  Recent Labs  01/26/13 2213 01/27/13 0430 01/28/13 0520 01/29/13 0500 01/29/13 0946 01/29/13 1407  HGB  --  13.9 13.6 13.3 14.4  --   HCT  --  40.0 39.7 38.2* 42.1  --   PLT  --  134* 142* 172 167  --   HEPARINUNFRC 0.21* 0.43 0.36 0.22*  --  0.34  CREATININE  --  0.98  --   --  0.96  --    Lab Results  Component Value Date   INR 1.09 01/26/2013   INR 1.03 01/25/2013    Estimated Creatinine Clearance: 81.7 ml/min (by C-G formula based on Cr of 0.96).  Pertinent Medications:  Scheduled:  . [START ON 01/30/2013] aminocaproic acid (AMICAR) for OHS   Intravenous To OR  . aspirin  81 mg Oral Daily  . [START ON 01/30/2013] cefUROXime (ZINACEF)  IV  1.5 g Intravenous To OR  . [START ON 01/30/2013] cefUROXime (ZINACEF)  IV  750 mg Intravenous To OR  . chlorhexidine  60 mL Topical Once   And  . [START ON 01/30/2013] chlorhexidine  60 mL Topical Once  . [START ON 01/30/2013] dexmedetomidine  0.1-0.7 mcg/kg/hr Intravenous To OR  . [START ON 01/30/2013] diazepam  5 mg Oral Once  . [START ON 01/30/2013] DOPamine  2-20 mcg/kg/min Intravenous To OR  . [START ON 01/30/2013] epinephrine  0.5-20 mcg/min Intravenous To OR  . [START ON 01/30/2013] heparin-papaverine-plasmalyte irrigation   Irrigation To OR  . [START ON 01/30/2013] heparin 30,000 units/NS 1000 mL solution for CELLSAVER   Other To OR  . insulin aspart  0-15 Units Subcutaneous TID WC  . insulin glargine  14 Units Subcutaneous BID  . [START ON 01/30/2013] insulin (NOVOLIN-R) infusion   Intravenous To OR  . isosorbide mononitrate  60 mg Oral Daily  . magnesium oxide  400 mg Oral Daily  . [START ON 01/30/2013] magnesium sulfate  40 mEq Other To OR  . metoprolol  200 mg Oral Daily  . [START ON 01/30/2013] metoprolol tartrate  12.5 mg Oral Once  . [START ON 01/30/2013] nitroGLYCERIN  2-200 mcg/min Intravenous To OR   . pantoprazole  40 mg Oral BID  . [START ON 01/30/2013] phenylephrine (NEO-SYNEPHRINE) Adult infusion  30-200 mcg/min Intravenous To OR  . polyethylene glycol  17 g Oral Daily  . [START ON 01/30/2013] potassium chloride  80 mEq Other To OR  . ramipril  20 mg Oral Daily  . simvastatin  40 mg Oral Daily  . [START ON 01/30/2013] vancomycin  1,500 mg Intravenous To OR   Infusions:  . sodium chloride 10 mL/hr at 01/27/13 2013  . heparin 1,650 Units/hr (01/29/13 0815)  . nitroGLYCERIN Stopped (01/27/13 0900)    Assessment: 72 yo male admitted 01/25/2013   with hx CAD admitted with Pharmacy consulted to dose heparin.  NSTEMI;  Heparin level slightly subtherapeutic on 1500 units/hr this AM, rate turned up to 1650 units/hr and heparin level now therapeutic at 0.34. CBC stable. No bleeding noted.  Planning for CABG on Friday.  Goal of Therapy: Heparin level 0.3-0.7 Monitor platelets by anticoagulation protocol: Yes  Plan: 1. Continue IV heparin 1650 units/hr. 2. Continue daily heparin level and CBC. 3. CABG tomorrow.  Thank you for allowing pharmacy to be a part of this patient's care team.  Tad Moore, BCPS  Clinical Pharmacist Pager [phone]  01/29/2013 3:04 PM

## 2013-01-29 NOTE — Progress Notes (Signed)
Pt. Has already placed himself on his home CPAP with nasal mask. Pt. Is tolerating CPAP well at this time. All vitals are within normal limits.

## 2013-01-29 NOTE — Progress Notes (Signed)
CARDIAC REHAB PHASE I   PRE:  Rate/Rhythm: 62SR  BP:  Supine: 131/67  Sitting:   Standing:    SaO2: 97%RA  MODE:  Ambulation: 350 ft   POST:  Rate/Rhythm: 78SR  BP:  Supine:   Sitting: 180/82, 156/95  Standing:    SaO2: 95%RA 1140-1206 Pt walked 350 ft with steady gait. Stopped once to rest. To recliner after walk. C/o a little burning in chest after walk that felt like he had been running. Notified Dr. Tresa Endo as he assessed pt. This burning sensation relieved with rest. Pt  Knows only up in room with asst and no more walking due to burning in chest. To watch pre op video when family arrives. Discussed with pt that we will follow up after surgery. Pt demonstrated getting up without use of arms.   Luetta Nutting, RN BSN  01/29/2013 12:02 PM

## 2013-01-29 NOTE — Progress Notes (Signed)
Watched pre and post CABG videos ( # 112 and #  113) w/family members

## 2013-01-29 NOTE — Progress Notes (Signed)
PHARMACY FOLLOW UP NOTE   Pharmacy Consult for : Heparin Indication: NSTEMI   Dosing Weight: 94 KG  Labs:  Recent Labs  01/26/13 2213 01/27/13 0430 01/28/13 0520 01/29/13 0500  HGB  --  13.9 13.6 13.3  HCT  --  40.0 39.7 38.2*  PLT  --  134* 142* 172  HEPARINUNFRC 0.21* 0.43 0.36 0.22*  CREATININE  --  0.98  --   --    Lab Results  Component Value Date   INR 1.09 01/26/2013   INR 1.03 01/25/2013    Estimated Creatinine Clearance: 80 ml/min (by C-G formula based on Cr of 0.98).  Pertinent Medications:  Scheduled:  . aspirin  81 mg Oral Daily  . bisacodyl  5 mg Oral Once  . chlorhexidine  60 mL Topical Once   And  . [START ON 01/30/2013] chlorhexidine  60 mL Topical Once  . [START ON 01/30/2013] diazepam  5 mg Oral Once  . insulin aspart  0-15 Units Subcutaneous TID WC  . insulin glargine  14 Units Subcutaneous BID  . magnesium oxide  400 mg Oral Daily  . metoprolol  200 mg Oral Daily  . [START ON 01/30/2013] metoprolol tartrate  12.5 mg Oral Once  . pantoprazole  40 mg Oral BID  . polyethylene glycol  17 g Oral Daily  . ramipril  20 mg Oral Daily  . simvastatin  40 mg Oral Daily   Infusions:  . sodium chloride 10 mL/hr at 01/27/13 2013  . heparin 1,650 Units/hr (01/29/13 0815)  . nitroGLYCERIN Stopped (01/27/13 0900)    Assessment: 72 yo male admitted 01/25/2013   with hx CAD admitted with Pharmacy consulted to dose heparin.  NSTEMI;  Heparin level slightly subtherapeutic on 1500 units/hr this AM. CBC stable. No bleeding noted.  Planning for CABG on Friday.  Goal of Therapy: Heparin level 0.3-0.7 Monitor platelets by anticoagulation protocol: Yes  Plan: 1. Increase IV heparin to 1650 units/hr. 2. Recheck heparin level in 6 hrs. 3. Continue daily heparin level and CBC.  Thank you for allowing pharmacy to be a part of this patient's care team.  Tad Moore, BCPS  Clinical Pharmacist Pager [phone]  01/29/2013 9:45 AM

## 2013-01-29 NOTE — Progress Notes (Signed)
3 Days Post-Op Procedure(s) (LRB): LEFT HEART CATHETERIZATION WITH CORONARY ANGIOGRAM (N/A) Subjective: Severe three-vessel CAD with non-ST elevation MI Stable on IV heparin 3 days of Plavix washout, P2 Y. 12 level has increased to 230 Vital signs remained stable and surgery planned in a.m. Blood sugars 150-200 on Lantus plus sliding scale Objective: Vital signs in last 24 hours: Temp:  [97.6 F (36.4 C)-98.1 F (36.7 C)] 97.7 F (36.5 C) (11/20 1217) Pulse Rate:  [58-68] 65 (11/20 1217) Cardiac Rhythm:  [-] Normal sinus rhythm (11/20 0739) Resp:  [16-22] 18 (11/20 1217) BP: (119-157)/(61-90) 122/65 mmHg (11/20 1217) SpO2:  [99 %-100 %] 99 % (11/20 0739) Weight:  [239 lb 10.2 oz (108.7 kg)] 239 lb 10.2 oz (108.7 kg) (11/20 0500)  Hemodynamic parameters for last 24 hours:   afebrile, normal sinus rhythm  Intake/Output from previous day: 11/19 0701 - 11/20 0700 In: 645 [P.O.:350; I.V.:295] Out: 1350 [Urine:1350] Intake/Output this shift: Total I/O In: 200 [P.O.:200] Out: -   Lungs clear extremities warm  Lab Results:  Recent Labs  01/29/13 0500 01/29/13 0946  WBC 5.3 4.5  HGB 13.3 14.4  HCT 38.2* 42.1  PLT 172 167   BMET:  Recent Labs  01/27/13 0430 01/29/13 0946  NA 136 137  K 3.7 3.6  CL 99 101  CO2 20 24  GLUCOSE 191* 238*  BUN 10 14  CREATININE 0.98 0.96  CALCIUM 8.8 8.7    PT/INR: No results found for this basename: LABPROT, INR,  in the last 72 hours ABG    Component Value Date/Time   PHART 7.423 01/26/2013 1945   HCO3 23.7 01/26/2013 1945   TCO2 24.8 01/26/2013 1945   ACIDBASEDEF 0.2 01/26/2013 1945   O2SAT 90.3 01/26/2013 1945   CBG (last 3)   Recent Labs  01/29/13 0805 01/29/13 1202 01/29/13 1215  GLUCAP 161* 186* 201*   ````````` Assessment/Plan: S/P Procedure(s) (LRB): LEFT HEART CATHETERIZATION WITH CORONARY ANGIOGRAM (N/A) Plans multivessel CABG in a.m. Procedure, indications benefits and alternatives discussed with  patient. Major aspects of the operation including the location of the incisions, use of cardiopulmonary t bypass, all discussed  with patient. He understands the associated risks of MI, stroke, bleeding, blood product requirement, infection, and death. He agrees to proceed with surgery.   LOS: 4 days    VAN TRIGT III,Kohlton Gilpatrick 01/29/2013  ``````````````````````````````````

## 2013-01-30 ENCOUNTER — Encounter (HOSPITAL_COMMUNITY)
Admission: EM | Disposition: A | Discharge status: Home / Self Care | Payer: 59 | Source: Home / Self Care | Attending: Cardiothoracic Surgery

## 2013-01-30 ENCOUNTER — Encounter (HOSPITAL_COMMUNITY): Payer: 59 | Admitting: Certified Registered"

## 2013-01-30 ENCOUNTER — Inpatient Hospital Stay (HOSPITAL_COMMUNITY): Payer: 59 | Admitting: Certified Registered"

## 2013-01-30 ENCOUNTER — Inpatient Hospital Stay (HOSPITAL_COMMUNITY): Payer: 59

## 2013-01-30 DIAGNOSIS — Z951 Presence of aortocoronary bypass graft: Secondary | ICD-10-CM

## 2013-01-30 DIAGNOSIS — I2510 Atherosclerotic heart disease of native coronary artery without angina pectoris: Secondary | ICD-10-CM

## 2013-01-30 HISTORY — PX: CORONARY ARTERY BYPASS GRAFT: SHX141

## 2013-01-30 HISTORY — PX: INTRAOPERATIVE TRANSESOPHAGEAL ECHOCARDIOGRAM: SHX5062

## 2013-01-30 LAB — POCT I-STAT 3, ART BLOOD GAS (G3+)
Acid-base deficit: 1 mmol/L (ref 0.0–2.0)
Acid-base deficit: 2 mmol/L (ref 0.0–2.0)
Bicarbonate: 25.8 mEq/L — ABNORMAL HIGH (ref 20.0–24.0)
Bicarbonate: 23 mEq/L (ref 20.0–24.0)
Bicarbonate: 23.5 mEq/L (ref 20.0–24.0)
Bicarbonate: 22.5 mEq/L (ref 20.0–24.0)
O2 Saturation: 100 %
O2 Saturation: 100 %
O2 Saturation: 91 %
TCO2: 27 mmol/L (ref 0–100)
TCO2: 24 mmol/L (ref 0–100)
TCO2: 25 mmol/L (ref 0–100)
TCO2: 24 mmol/L (ref 0–100)
pCO2 arterial: 30.5 mmHg — ABNORMAL LOW (ref 35.0–45.0)
pCO2 arterial: 34 mmHg — ABNORMAL LOW (ref 35.0–45.0)
pCO2 arterial: 32.8 mmHg — ABNORMAL LOW (ref 35.0–45.0)
pCO2 arterial: 37.1 mmHg (ref 35.0–45.0)
pH, Arterial: 7.492 — ABNORMAL HIGH (ref 7.350–7.450)
pH, Arterial: 7.391 (ref 7.350–7.450)
pO2, Arterial: 369 mmHg — ABNORMAL HIGH (ref 80.0–100.0)
pO2, Arterial: 168 mmHg — ABNORMAL HIGH (ref 80.0–100.0)
pO2, Arterial: 55 mmHg — ABNORMAL LOW (ref 80.0–100.0)
pO2, Arterial: 69 mmHg — ABNORMAL LOW (ref 80.0–100.0)

## 2013-01-30 LAB — CBC
HCT: 37.6 % — ABNORMAL LOW (ref 39.0–52.0)
HCT: 31.3 % — ABNORMAL LOW (ref 39.0–52.0)
HCT: 30.4 % — ABNORMAL LOW (ref 39.0–52.0)
Hemoglobin: 10.8 g/dL — ABNORMAL LOW (ref 13.0–17.0)
Hemoglobin: 10.5 g/dL — ABNORMAL LOW (ref 13.0–17.0)
MCH: 28.8 pg (ref 26.0–34.0)
MCH: 28.6 pg (ref 26.0–34.0)
MCH: 28.8 pg (ref 26.0–34.0)
MCHC: 34 g/dL (ref 30.0–36.0)
MCHC: 34.5 g/dL (ref 30.0–36.0)
MCHC: 34.5 g/dL (ref 30.0–36.0)
MCV: 84.5 fL (ref 78.0–100.0)
MCV: 83 fL (ref 78.0–100.0)
MCV: 83.3 fL (ref 78.0–100.0)
Platelets: 147 10*3/uL — ABNORMAL LOW (ref 150–400)
Platelets: 169 10*3/uL (ref 150–400)
RBC: 3.77 MIL/uL — ABNORMAL LOW (ref 4.22–5.81)
RBC: 3.65 MIL/uL — ABNORMAL LOW (ref 4.22–5.81)
RDW: 13.7 % (ref 11.5–15.5)
RDW: 13.4 % (ref 11.5–15.5)
RDW: 13.6 % (ref 11.5–15.5)
WBC: 5.3 10*3/uL (ref 4.0–10.5)
WBC: 7.9 10*3/uL (ref 4.0–10.5)

## 2013-01-30 LAB — POCT I-STAT 4, (NA,K, GLUC, HGB,HCT)
Glucose, Bld: 103 mg/dL — ABNORMAL HIGH (ref 70–99)
Glucose, Bld: 114 mg/dL — ABNORMAL HIGH (ref 70–99)
Glucose, Bld: 170 mg/dL — ABNORMAL HIGH (ref 70–99)
HCT: 36 % — ABNORMAL LOW (ref 39.0–52.0)
HCT: 26 % — ABNORMAL LOW (ref 39.0–52.0)
HCT: 31 % — ABNORMAL LOW (ref 39.0–52.0)
HCT: 29 % — ABNORMAL LOW (ref 39.0–52.0)
Hemoglobin: 12.2 g/dL — ABNORMAL LOW (ref 13.0–17.0)
Hemoglobin: 8.8 g/dL — ABNORMAL LOW (ref 13.0–17.0)
Hemoglobin: 9.5 g/dL — ABNORMAL LOW (ref 13.0–17.0)
Hemoglobin: 9.9 g/dL — ABNORMAL LOW (ref 13.0–17.0)
Hemoglobin: 10.9 g/dL — ABNORMAL LOW (ref 13.0–17.0)
Potassium: 3.8 mEq/L (ref 3.5–5.1)
Potassium: 4.3 mEq/L (ref 3.5–5.1)
Potassium: 3.4 mEq/L — ABNORMAL LOW (ref 3.5–5.1)
Sodium: 140 mEq/L (ref 135–145)
Sodium: 141 mEq/L (ref 135–145)
Sodium: 141 mEq/L (ref 135–145)
Sodium: 141 mEq/L (ref 135–145)

## 2013-01-30 LAB — HEPARIN LEVEL (UNFRACTIONATED): Heparin Unfractionated: 0.38 IU/mL (ref 0.30–0.70)

## 2013-01-30 LAB — POCT I-STAT, CHEM 8
BUN: 9 mg/dL (ref 6–23)
Calcium, Ion: 1.11 mmol/L — ABNORMAL LOW (ref 1.13–1.30)
Chloride: 108 mEq/L (ref 96–112)
Glucose, Bld: 138 mg/dL — ABNORMAL HIGH (ref 70–99)
HCT: 29 % — ABNORMAL LOW (ref 39.0–52.0)
Hemoglobin: 9.9 g/dL — ABNORMAL LOW (ref 13.0–17.0)
Potassium: 4 mEq/L (ref 3.5–5.1)
TCO2: 22 mmol/L (ref 0–100)

## 2013-01-30 LAB — GLUCOSE, CAPILLARY
Glucose-Capillary: 115 mg/dL — ABNORMAL HIGH (ref 70–99)
Glucose-Capillary: 101 mg/dL — ABNORMAL HIGH (ref 70–99)
Glucose-Capillary: 117 mg/dL — ABNORMAL HIGH (ref 70–99)

## 2013-01-30 LAB — PLATELET COUNT: Platelets: 118 10*3/uL — ABNORMAL LOW (ref 150–400)

## 2013-01-30 LAB — CREATININE, SERUM
Creatinine, Ser: 0.91 mg/dL (ref 0.50–1.35)
GFR calc Af Amer: >90 mL/min (ref >90)
GFR calc non Af Amer: 83 mL/min — ABNORMAL LOW (ref >90)

## 2013-01-30 LAB — PROTIME-INR: INR: 1.34 (ref 0.00–1.49)

## 2013-01-30 LAB — MAGNESIUM: Magnesium: 3.2 mg/dL — ABNORMAL HIGH (ref 1.5–2.5)

## 2013-01-30 LAB — APTT: aPTT: 36 seconds (ref 24–37)

## 2013-01-30 LAB — HEMOGLOBIN AND HEMATOCRIT, BLOOD
HCT: 28.6 % — ABNORMAL LOW (ref 39.0–52.0)
Hemoglobin: 10.2 g/dL — ABNORMAL LOW (ref 13.0–17.0)

## 2013-01-30 SURGERY — CORONARY ARTERY BYPASS GRAFTING (CABG)
Anesthesia: General | Site: Chest | Wound class: Clean

## 2013-01-30 MED ORDER — MORPHINE SULFATE 2 MG/ML IJ SOLN
2.0000 mg | INTRAMUSCULAR | Status: DC | PRN
  Administered 2013-01-31: 2 mg via INTRAVENOUS
  Administered 2013-01-31: 4 mg via INTRAVENOUS
  Administered 2013-01-31 – 2013-02-01 (×5): 2 mg via INTRAVENOUS
  Filled 2013-01-30 (×5): qty 1
  Filled 2013-01-30: qty 2
  Filled 2013-01-30: qty 1

## 2013-01-30 MED ORDER — ALBUMIN HUMAN 5 % IV SOLN
INTRAVENOUS | Status: DC | PRN
  Administered 2013-01-30: 13:00:00 via INTRAVENOUS

## 2013-01-30 MED ORDER — LACTATED RINGERS IV SOLN
INTRAVENOUS | Status: DC | PRN
  Administered 2013-01-30: 07:00:00 via INTRAVENOUS

## 2013-01-30 MED ORDER — DOPAMINE-DEXTROSE 3.2-5 MG/ML-% IV SOLN: 0.0000 ug/kg/min | INTRAVENOUS | Status: AC

## 2013-01-30 MED ORDER — SODIUM CHLORIDE 0.9 % IV SOLN
100.0000 [IU] | INTRAVENOUS | Status: DC | PRN
  Administered 2013-01-30: 3.8 [IU]/h via INTRAVENOUS

## 2013-01-30 MED ORDER — ACETAMINOPHEN 160 MG/5ML PO SOLN: 650.0000 mg | Freq: Once | ORAL | Status: AC

## 2013-01-30 MED ORDER — PHENYLEPHRINE HCL 10 MG/ML IJ SOLN
10.0000 mg | INTRAVENOUS | Status: DC | PRN
  Administered 2013-01-30: 15 ug/min via INTRAVENOUS

## 2013-01-30 MED ORDER — LACTATED RINGERS IV SOLN
INTRAVENOUS | Status: DC
  Administered 2013-01-30: 19:00:00 via INTRAVENOUS

## 2013-01-30 MED ORDER — LEVALBUTEROL TARTRATE 45 MCG/ACT IN AERO
2.0000 | INHALATION_SPRAY | Freq: Four times a day (QID) | RESPIRATORY_TRACT | Status: DC
  Administered 2013-01-30 – 2013-01-31 (×2): 2 via RESPIRATORY_TRACT
  Filled 2013-01-30: qty 15

## 2013-01-30 MED ORDER — LACTATED RINGERS IV SOLN: 500.0000 mL | Freq: Once | INTRAVENOUS | Status: AC | PRN

## 2013-01-30 MED ORDER — ACETAMINOPHEN 650 MG RE SUPP
650.0000 mg | Freq: Once | RECTAL | Status: AC
  Administered 2013-01-30: 650 mg via RECTAL

## 2013-01-30 MED ORDER — SODIUM CHLORIDE 0.45 % IV SOLN
INTRAVENOUS | Status: DC
  Administered 2013-01-30: 20 mL/h via INTRAVENOUS

## 2013-01-30 MED ORDER — METOPROLOL TARTRATE 1 MG/ML IV SOLN
2.5000 mg | INTRAVENOUS | Status: DC | PRN
  Administered 2013-01-31 (×2): 2.5 mg via INTRAVENOUS

## 2013-01-30 MED ORDER — DEXMEDETOMIDINE HCL IN NACL 200 MCG/50ML IV SOLN
0.1000 ug/kg/h | INTRAVENOUS | Status: DC
Start: 2013-01-30 — End: 2013-01-31

## 2013-01-30 MED ORDER — MORPHINE SULFATE 2 MG/ML IJ SOLN: 1.0000 mg | INTRAMUSCULAR | Status: AC | PRN

## 2013-01-30 MED ORDER — SUCCINYLCHOLINE CHLORIDE 20 MG/ML IJ SOLN
INTRAMUSCULAR | Status: DC | PRN
  Administered 2013-01-30: 120 mg via INTRAVENOUS

## 2013-01-30 MED ORDER — SODIUM CHLORIDE 0.9 % IJ SOLN
OROMUCOSAL | Status: DC | PRN
  Administered 2013-01-30 (×3): via TOPICAL

## 2013-01-30 MED ORDER — DOCUSATE SODIUM 100 MG PO CAPS
200.0000 mg | ORAL_CAPSULE | Freq: Every day | ORAL | Status: DC
  Administered 2013-01-31 – 2013-02-06 (×7): 200 mg via ORAL
  Filled 2013-01-30 (×7): qty 2

## 2013-01-30 MED ORDER — HEMOSTATIC AGENTS (NO CHARGE) OPTIME
TOPICAL | Status: DC | PRN
  Administered 2013-01-30: 1 via TOPICAL

## 2013-01-30 MED ORDER — ASPIRIN 81 MG PO CHEW: 324.0000 mg | CHEWABLE_TABLET | Freq: Every day | ORAL | Status: DC

## 2013-01-30 MED ORDER — ACETAMINOPHEN 160 MG/5ML PO SOLN
1000.0000 mg | Freq: Four times a day (QID) | ORAL | Status: DC
  Administered 2013-01-31: 1000 mg
  Filled 2013-01-30: qty 40.6

## 2013-01-30 MED ORDER — NITROGLYCERIN IN D5W 200-5 MCG/ML-% IV SOLN
0.0000 ug/min | INTRAVENOUS | Status: DC
  Administered 2013-01-31: 40 ug/min via INTRAVENOUS
  Filled 2013-01-30: qty 250

## 2013-01-30 MED ORDER — METOPROLOL TARTRATE 25 MG/10 ML ORAL SUSPENSION
12.5000 mg | Freq: Two times a day (BID) | ORAL | Status: DC
  Filled 2013-01-30 (×7): qty 5

## 2013-01-30 MED ORDER — BUDESONIDE-FORMOTEROL FUMARATE 160-4.5 MCG/ACT IN AERO
2.0000 | INHALATION_SPRAY | Freq: Two times a day (BID) | RESPIRATORY_TRACT | Status: DC
  Administered 2013-01-31 – 2013-02-06 (×13): 2 via RESPIRATORY_TRACT
  Filled 2013-01-30 (×2): qty 6

## 2013-01-30 MED ORDER — ALBUMIN HUMAN 5 % IV SOLN
250.0000 mL | INTRAVENOUS | Status: AC | PRN
  Administered 2013-01-30 (×2): 250 mL via INTRAVENOUS

## 2013-01-30 MED ORDER — VANCOMYCIN HCL IN DEXTROSE 1-5 GM/200ML-% IV SOLN
1000.0000 mg | Freq: Once | INTRAVENOUS | Status: AC
  Administered 2013-01-30: 1000 mg via INTRAVENOUS
  Filled 2013-01-30: qty 200

## 2013-01-30 MED ORDER — PANTOPRAZOLE SODIUM 40 MG PO TBEC
40.0000 mg | DELAYED_RELEASE_TABLET | Freq: Every day | ORAL | Status: DC
  Administered 2013-02-01 – 2013-02-06 (×6): 40 mg via ORAL
  Filled 2013-01-30 (×7): qty 1

## 2013-01-30 MED ORDER — SODIUM CHLORIDE 0.9 % IJ SOLN
3.0000 mL | Freq: Two times a day (BID) | INTRAMUSCULAR | Status: DC
  Administered 2013-01-31 – 2013-02-02 (×4): 3 mL via INTRAVENOUS

## 2013-01-30 MED ORDER — NITROGLYCERIN IN D5W 200-5 MCG/ML-% IV SOLN
INTRAVENOUS | Status: DC | PRN
  Administered 2013-01-30: 5 ug/min via INTRAVENOUS

## 2013-01-30 MED ORDER — SODIUM CHLORIDE 0.9 % IV SOLN: 250.0000 mL | INTRAVENOUS | Status: DC

## 2013-01-30 MED ORDER — ONDANSETRON HCL 4 MG/2ML IJ SOLN
4.0000 mg | Freq: Four times a day (QID) | INTRAMUSCULAR | Status: DC | PRN
  Administered 2013-01-31 – 2013-02-02 (×2): 4 mg via INTRAVENOUS
  Filled 2013-01-30 (×2): qty 2

## 2013-01-30 MED ORDER — ASPIRIN EC 325 MG PO TBEC
325.0000 mg | DELAYED_RELEASE_TABLET | Freq: Every day | ORAL | Status: DC
  Administered 2013-01-31 – 2013-02-02 (×3): 325 mg via ORAL
  Filled 2013-01-30 (×3): qty 1

## 2013-01-30 MED ORDER — POTASSIUM CHLORIDE 10 MEQ/50ML IV SOLN: 10.0000 meq | INTRAVENOUS | Status: DC

## 2013-01-30 MED ORDER — PROPOFOL 10 MG/ML IV BOLUS
INTRAVENOUS | Status: DC | PRN
  Administered 2013-01-30: 50 mg via INTRAVENOUS

## 2013-01-30 MED ORDER — DOPAMINE-DEXTROSE 3.2-5 MG/ML-% IV SOLN
INTRAVENOUS | Status: DC | PRN
  Administered 2013-01-30: 3 ug/kg/min via INTRAVENOUS

## 2013-01-30 MED ORDER — INSULIN REGULAR BOLUS VIA INFUSION
0.0000 [IU] | Freq: Three times a day (TID) | INTRAVENOUS | Status: DC
  Filled 2013-01-30: qty 10

## 2013-01-30 MED ORDER — MILRINONE IN DEXTROSE 20 MG/100ML IV SOLN
INTRAVENOUS | Status: DC | PRN
  Administered 2013-01-30: .3 ug/kg/min via INTRAVENOUS

## 2013-01-30 MED ORDER — POTASSIUM CHLORIDE 10 MEQ/50ML IV SOLN
10.0000 meq | INTRAVENOUS | Status: AC
  Administered 2013-01-30 (×3): 10 meq via INTRAVENOUS

## 2013-01-30 MED ORDER — OXYCODONE HCL 5 MG PO TABS
5.0000 mg | ORAL_TABLET | ORAL | Status: DC | PRN
  Administered 2013-01-31 (×3): 10 mg via ORAL
  Administered 2013-02-01 – 2013-02-03 (×5): 5 mg via ORAL
  Administered 2013-02-05: 10 mg via ORAL
  Filled 2013-01-30: qty 1
  Filled 2013-01-30 (×3): qty 2
  Filled 2013-01-30 (×4): qty 1
  Filled 2013-01-30: qty 2

## 2013-01-30 MED ORDER — SODIUM CHLORIDE 0.9 % IV SOLN
INTRAVENOUS | Status: DC
  Administered 2013-01-30: 22:00:00 via INTRAVENOUS
  Filled 2013-01-30 (×2): qty 1

## 2013-01-30 MED ORDER — SODIUM CHLORIDE 0.9 % IV SOLN: INTRAVENOUS | Status: DC

## 2013-01-30 MED ORDER — MILRINONE IN DEXTROSE 20 MG/100ML IV SOLN
0.1250 ug/kg/min | INTRAVENOUS | Status: DC
  Filled 2013-01-30: qty 100

## 2013-01-30 MED ORDER — SODIUM CHLORIDE 0.9 % IJ SOLN
3.0000 mL | INTRAMUSCULAR | Status: DC | PRN
  Administered 2013-02-02: 3 mL via INTRAVENOUS

## 2013-01-30 MED ORDER — MIDAZOLAM HCL 2 MG/2ML IJ SOLN: 2.0000 mg | INTRAMUSCULAR | Status: DC | PRN

## 2013-01-30 MED ORDER — GLYCOPYRROLATE 0.2 MG/ML IJ SOLN
INTRAMUSCULAR | Status: DC | PRN
  Administered 2013-01-30: 0.2 mg via INTRAVENOUS

## 2013-01-30 MED ORDER — FAMOTIDINE IN NACL 20-0.9 MG/50ML-% IV SOLN
20.0000 mg | Freq: Two times a day (BID) | INTRAVENOUS | Status: AC
  Administered 2013-01-30: 20 mg via INTRAVENOUS

## 2013-01-30 MED ORDER — VECURONIUM BROMIDE 10 MG IV SOLR
INTRAVENOUS | Status: DC | PRN
  Administered 2013-01-30 (×2): 5 mg via INTRAVENOUS
  Administered 2013-01-30 (×2): 10 mg via INTRAVENOUS

## 2013-01-30 MED ORDER — PROTAMINE SULFATE 10 MG/ML IV SOLN
INTRAVENOUS | Status: DC | PRN
  Administered 2013-01-30: 25 mg via INTRAVENOUS

## 2013-01-30 MED ORDER — BISACODYL 10 MG RE SUPP: 10.0000 mg | Freq: Every day | RECTAL | Status: DC

## 2013-01-30 MED ORDER — ACETAMINOPHEN 500 MG PO TABS
1000.0000 mg | ORAL_TABLET | Freq: Four times a day (QID) | ORAL | Status: DC
  Administered 2013-01-31 – 2013-02-02 (×10): 1000 mg via ORAL
  Filled 2013-01-30 (×14): qty 2

## 2013-01-30 MED ORDER — PHENYLEPHRINE HCL 10 MG/ML IJ SOLN
0.0000 ug/min | INTRAVENOUS | Status: DC
  Filled 2013-01-30: qty 2

## 2013-01-30 MED ORDER — HEPARIN SODIUM (PORCINE) 1000 UNIT/ML IJ SOLN
INTRAMUSCULAR | Status: DC | PRN
  Administered 2013-01-30: 5 mL via INTRAVENOUS
  Administered 2013-01-30: 28 mL via INTRAVENOUS

## 2013-01-30 MED ORDER — SODIUM CHLORIDE 0.9 % IV SOLN
10.0000 g | INTRAVENOUS | Status: DC | PRN
  Administered 2013-01-30: 5 g/h via INTRAVENOUS

## 2013-01-30 MED ORDER — SODIUM CHLORIDE 0.9 % IV SOLN
200.0000 ug | INTRAVENOUS | Status: DC | PRN
  Administered 2013-01-30: .3 ug/kg/h via INTRAVENOUS

## 2013-01-30 MED ORDER — MILRINONE IN DEXTROSE 20 MG/100ML IV SOLN
0.2000 ug/kg/min | INTRAVENOUS | Status: DC
  Administered 2013-01-30: 0.2 ug/kg/min via INTRAVENOUS
  Filled 2013-01-30: qty 100

## 2013-01-30 MED ORDER — MIDAZOLAM HCL 5 MG/5ML IJ SOLN
INTRAMUSCULAR | Status: DC | PRN
  Administered 2013-01-30: 5 mg via INTRAVENOUS
  Administered 2013-01-30: 4 mg via INTRAVENOUS
  Administered 2013-01-30: 1 mg via INTRAVENOUS

## 2013-01-30 MED ORDER — FENTANYL CITRATE 0.05 MG/ML IJ SOLN
INTRAMUSCULAR | Status: DC | PRN
  Administered 2013-01-30: 150 ug via INTRAVENOUS
  Administered 2013-01-30: 250 ug via INTRAVENOUS
  Administered 2013-01-30 (×2): 50 ug via INTRAVENOUS
  Administered 2013-01-30 (×3): 100 ug via INTRAVENOUS
  Administered 2013-01-30: 150 ug via INTRAVENOUS
  Administered 2013-01-30 (×3): 100 ug via INTRAVENOUS
  Administered 2013-01-30: 150 ug via INTRAVENOUS
  Administered 2013-01-30 (×2): 100 ug via INTRAVENOUS
  Administered 2013-01-30: 150 ug via INTRAVENOUS

## 2013-01-30 MED ORDER — DEXTROSE 5 % IV SOLN
1.5000 g | Freq: Two times a day (BID) | INTRAVENOUS | Status: AC
  Administered 2013-01-30 – 2013-02-01 (×4): 1.5 g via INTRAVENOUS
  Filled 2013-01-30 (×4): qty 1.5

## 2013-01-30 MED ORDER — MAGNESIUM SULFATE 40 MG/ML IJ SOLN
4.0000 g | Freq: Once | INTRAMUSCULAR | Status: AC
  Administered 2013-01-30: 4 g via INTRAVENOUS
  Filled 2013-01-30: qty 100

## 2013-01-30 MED ORDER — BISACODYL 5 MG PO TBEC
10.0000 mg | DELAYED_RELEASE_TABLET | Freq: Every day | ORAL | Status: DC
  Administered 2013-01-31 – 2013-02-05 (×5): 10 mg via ORAL
  Filled 2013-01-30 (×4): qty 2

## 2013-01-30 MED ORDER — METOPROLOL TARTRATE 12.5 MG HALF TABLET
12.5000 mg | ORAL_TABLET | Freq: Two times a day (BID) | ORAL | Status: DC
  Administered 2013-01-31 – 2013-02-02 (×6): 12.5 mg via ORAL
  Filled 2013-01-30 (×9): qty 1

## 2013-01-30 SURGICAL SUPPLY — 105 items
ADAPTER CARDIO PERF ANTE/RETRO (ADAPTER) ×4 IMPLANT
ATTRACTOMAT 16X20 MAGNETIC DRP (DRAPES) ×4 IMPLANT
BAG DECANTER FOR FLEXI CONT (MISCELLANEOUS) ×4 IMPLANT
BANDAGE ELASTIC 4 VELCRO ST LF (GAUZE/BANDAGES/DRESSINGS) ×4 IMPLANT
BANDAGE ELASTIC 6 VELCRO ST LF (GAUZE/BANDAGES/DRESSINGS) ×4 IMPLANT
BANDAGE GAUZE ELAST BULKY 4 IN (GAUZE/BANDAGES/DRESSINGS) ×4 IMPLANT
BASKET HEART  (ORDER IN 25'S) (MISCELLANEOUS) ×1
BASKET HEART (ORDER IN 25'S) (MISCELLANEOUS) ×1
BASKET HEART (ORDER IN 25S) (MISCELLANEOUS) ×2 IMPLANT
BENZOIN TINCTURE PRP APPL 2/3 (GAUZE/BANDAGES/DRESSINGS) ×4 IMPLANT
BLADE STERNUM SYSTEM 6 (BLADE) ×4 IMPLANT
BLADE SURG 12 STRL SS (BLADE) ×4 IMPLANT
BLADE SURG ROTATE 9660 (MISCELLANEOUS) IMPLANT
CANISTER SUCTION 2500CC (MISCELLANEOUS) ×4 IMPLANT
CANNULA AORTIC HI-FLOW 6.5M20F (CANNULA) IMPLANT
CANNULA ARTERIAL NVNT 3/8 20FR (MISCELLANEOUS) ×8 IMPLANT
CANNULA ARTERIAL NVNT 3/8 22FR (MISCELLANEOUS) ×4 IMPLANT
CANNULA GUNDRY RCSP 15FR (MISCELLANEOUS) ×4 IMPLANT
CANNULA VENOUS LOW PROF 32X40 (CANNULA) IMPLANT
CANNULA VENOUS LOW PROF 34X46 (CANNULA) ×4 IMPLANT
CANNULA VENOUS MAL SGL STG 40 (MISCELLANEOUS) IMPLANT
CANNULAE VENOUS MAL SGL STG 40 (MISCELLANEOUS)
CATH CPB KIT VANTRIGT (MISCELLANEOUS) ×4 IMPLANT
CATH ROBINSON RED A/P 18FR (CATHETERS) ×12 IMPLANT
CATH THORACIC 28FR (CATHETERS) IMPLANT
CATH THORACIC 28FR RT ANG (CATHETERS) IMPLANT
CATH THORACIC 36FR (CATHETERS) IMPLANT
CATH THORACIC 36FR RT ANG (CATHETERS) ×4 IMPLANT
CLIP TI WIDE RED SMALL 24 (CLIP) ×4 IMPLANT
CLOSURE STERI-STRIP 1/2X4 (GAUZE/BANDAGES/DRESSINGS) ×1
CLSR STERI-STRIP ANTIMIC 1/2X4 (GAUZE/BANDAGES/DRESSINGS) ×3 IMPLANT
CONN ST 1/4X3/8  BEN (MISCELLANEOUS) ×2
CONN ST 1/4X3/8 BEN (MISCELLANEOUS) ×2 IMPLANT
COVER SURGICAL LIGHT HANDLE (MISCELLANEOUS) ×4 IMPLANT
CRADLE DONUT ADULT HEAD (MISCELLANEOUS) ×4 IMPLANT
DRAIN CHANNEL 32F RND 10.7 FF (WOUND CARE) ×4 IMPLANT
DRAPE CARDIOVASCULAR INCISE (DRAPES) ×2
DRAPE SLUSH/WARMER DISC (DRAPES) ×4 IMPLANT
DRAPE SRG 135X102X78XABS (DRAPES) ×2 IMPLANT
DRSG AQUACEL AG ADV 3.5X14 (GAUZE/BANDAGES/DRESSINGS) ×4 IMPLANT
ELECT BLADE 4.0 EZ CLEAN MEGAD (MISCELLANEOUS) ×4
ELECT BLADE 6.5 EXT (BLADE) ×4 IMPLANT
ELECT CAUTERY BLADE 6.4 (BLADE) ×4 IMPLANT
ELECT REM PT RETURN 9FT ADLT (ELECTROSURGICAL) ×8
ELECTRODE BLDE 4.0 EZ CLN MEGD (MISCELLANEOUS) ×2 IMPLANT
ELECTRODE REM PT RTRN 9FT ADLT (ELECTROSURGICAL) ×4 IMPLANT
GLOVE BIO SURGEON STRL SZ7.5 (GLOVE) ×8 IMPLANT
GOWN STRL NON-REIN LRG LVL3 (GOWN DISPOSABLE) ×24 IMPLANT
HEMOSTAT POWDER SURGIFOAM 1G (HEMOSTASIS) ×12 IMPLANT
HEMOSTAT SURGICEL 2X14 (HEMOSTASIS) ×4 IMPLANT
INSERT FOGARTY XLG (MISCELLANEOUS) ×4 IMPLANT
KIT BASIN OR (CUSTOM PROCEDURE TRAY) ×4 IMPLANT
KIT ROOM TURNOVER OR (KITS) ×4 IMPLANT
KIT SUCTION CATH 14FR (SUCTIONS) ×4 IMPLANT
KIT VASOVIEW W/TROCAR VH 2000 (KITS) ×4 IMPLANT
LEAD PACING MYOCARDI (MISCELLANEOUS) ×8 IMPLANT
MARKER GRAFT CORONARY BYPASS (MISCELLANEOUS) ×12 IMPLANT
NS IRRIG 1000ML POUR BTL (IV SOLUTION) ×24 IMPLANT
PACK OPEN HEART (CUSTOM PROCEDURE TRAY) ×4 IMPLANT
PAD ARMBOARD 7.5X6 YLW CONV (MISCELLANEOUS) ×8 IMPLANT
PAD ELECT DEFIB RADIOL ZOLL (MISCELLANEOUS) ×4 IMPLANT
PENCIL BUTTON HOLSTER BLD 10FT (ELECTRODE) ×4 IMPLANT
PUNCH AORTIC ROTATE 4.0MM (MISCELLANEOUS) IMPLANT
PUNCH AORTIC ROTATE 4.5MM 8IN (MISCELLANEOUS) IMPLANT
PUNCH AORTIC ROTATE 5MM 8IN (MISCELLANEOUS) IMPLANT
SET CARDIOPLEGIA MPS 5001102 (MISCELLANEOUS) ×4 IMPLANT
SPONGE GAUZE 4X4 12PLY (GAUZE/BANDAGES/DRESSINGS) ×4 IMPLANT
SUT BONE WAX W31G (SUTURE) ×4 IMPLANT
SUT MNCRL AB 4-0 PS2 18 (SUTURE) ×8 IMPLANT
SUT PROLENE 3 0 SH DA (SUTURE) ×8 IMPLANT
SUT PROLENE 3 0 SH1 36 (SUTURE) IMPLANT
SUT PROLENE 4 0 RB 1 (SUTURE) ×4
SUT PROLENE 4 0 SH DA (SUTURE) ×8 IMPLANT
SUT PROLENE 4-0 RB1 .5 CRCL 36 (SUTURE) ×4 IMPLANT
SUT PROLENE 5 0 C 1 36 (SUTURE) IMPLANT
SUT PROLENE 6 0 C 1 30 (SUTURE) IMPLANT
SUT PROLENE 6 0 CC (SUTURE) ×12 IMPLANT
SUT PROLENE 7 0 DA (SUTURE) IMPLANT
SUT PROLENE 7.0 RB 3 (SUTURE) ×12 IMPLANT
SUT PROLENE 8 0 BV175 6 (SUTURE) IMPLANT
SUT PROLENE BLUE 7 0 (SUTURE) ×4 IMPLANT
SUT SILK  1 MH (SUTURE)
SUT SILK 1 MH (SUTURE) IMPLANT
SUT SILK 2 0 SH CR/8 (SUTURE) IMPLANT
SUT SILK 3 0 SH CR/8 (SUTURE) IMPLANT
SUT STEEL 6MS V (SUTURE) ×8 IMPLANT
SUT STEEL SZ 6 DBL 3X14 BALL (SUTURE) ×4 IMPLANT
SUT VIC AB 1 CTX 36 (SUTURE) ×6
SUT VIC AB 1 CTX36XBRD ANBCTR (SUTURE) ×6 IMPLANT
SUT VIC AB 2-0 CT1 27 (SUTURE) ×4
SUT VIC AB 2-0 CT1 TAPERPNT 27 (SUTURE) ×4 IMPLANT
SUT VIC AB 2-0 CTX 27 (SUTURE) IMPLANT
SUT VIC AB 3-0 SH 27 (SUTURE)
SUT VIC AB 3-0 SH 27X BRD (SUTURE) IMPLANT
SUT VIC AB 3-0 X1 27 (SUTURE) IMPLANT
SUT VICRYL 4-0 PS2 18IN ABS (SUTURE) IMPLANT
SUTURE E-PAK OPEN HEART (SUTURE) ×4 IMPLANT
SYSTEM SAHARA CHEST DRAIN ATS (WOUND CARE) ×4 IMPLANT
TAPE CLOTH SURG 4X10 WHT LF (GAUZE/BANDAGES/DRESSINGS) ×4 IMPLANT
TOWEL OR 17X24 6PK STRL BLUE (TOWEL DISPOSABLE) ×8 IMPLANT
TOWEL OR 17X26 10 PK STRL BLUE (TOWEL DISPOSABLE) ×8 IMPLANT
TRAY FOLEY IC TEMP SENS 14FR (CATHETERS) ×4 IMPLANT
TUBING INSUFFLATION 10FT LAP (TUBING) ×4 IMPLANT
UNDERPAD 30X30 INCONTINENT (UNDERPADS AND DIAPERS) ×4 IMPLANT
WATER STERILE IRR 1000ML POUR (IV SOLUTION) ×8 IMPLANT

## 2013-01-30 NOTE — Preoperative (Signed)
Beta Blockers   Reason not to administer Beta Blockers:Hold  beta blocker due to Bradycardia (HR less than 50 bpm) 

## 2013-01-30 NOTE — Anesthesia Postprocedure Evaluation (Signed)
  Anesthesia Post-op Note  Patient: Earl Williamson  Procedure(s) Performed: Procedure(s): CORONARY ARTERY BYPASS GRAFTING (CABG) (N/A) INTRAOPERATIVE TRANSESOPHAGEAL ECHOCARDIOGRAM (N/A)  Patient Location: ICU  Anesthesia Type:General  Level of Consciousness: Patient remains intubated per anesthesia plan  Airway and Oxygen Therapy: Patient remains intubated per anesthesia plan and Patient placed on Ventilator (see vital sign flow sheet for setting)  Post-op Pain: none  Post-op Assessment: Post-op Vital signs reviewed, Patient's Cardiovascular Status Stable, Respiratory Function Stable, Patent Airway, No signs of Nausea or vomiting and Pain level controlled  Post-op Vital Signs: Reviewed and stable  Complications: No apparent anesthesia complications

## 2013-01-30 NOTE — Brief Op Note (Signed)
01/25/2013 - 01/30/2013  11:49 AM  PATIENT:  Earl Williamson  72 y.o. male  PRE-OPERATIVE DIAGNOSIS:  Coronary artery disease  POST-OPERATIVE DIAGNOSIS:  Coronary artery disease  PROCEDURE:   CORONARY ARTERY BYPASS GRAFTING x 3  (LIMA-LAD, SVG- PD, SVG-Cx) ENDOSCOPIC VEIN HARVEST LEFT LEG  SURGEON:  Kerin Perna, MD  ASSISTANT: Coral Ceo, PA-C  ANESTHESIA:   general  PATIENT CONDITION:  ICU - intubated and hemodynamically stable.  PRE-OPERATIVE WEIGHT: 109 kg

## 2013-01-30 NOTE — Transfer of Care (Signed)
Immediate Anesthesia Transfer of Care Note  Patient: Earl Williamson  Procedure(s) Performed: Procedure(s): CORONARY ARTERY BYPASS GRAFTING (CABG) (N/A) INTRAOPERATIVE TRANSESOPHAGEAL ECHOCARDIOGRAM (N/A)  Patient Location: SICU  Anesthesia Type:General  Level of Consciousness: Patient remains intubated per anesthesia plan  Airway & Oxygen Therapy: Patient remains intubated per anesthesia plan  Post-op Assessment: Report given to PACU RN  Post vital signs: Reviewed and stable  Complications: No apparent anesthesia complications

## 2013-01-30 NOTE — Progress Notes (Signed)
The patient was examined and preop studies reviewed. There has been no change from the prior exam and the patient is ready for surgery.  Plan CABG on [name] today

## 2013-01-30 NOTE — Anesthesia Preprocedure Evaluation (Addendum)
Anesthesia Evaluation  Patient identified by MRN, date of birth, ID band Patient awake    Reviewed: Allergy & Precautions, H&P , NPO status , Patient's Chart, lab work & pertinent test results  Airway Mallampati: II TM Distance: <3 FB Neck ROM: Full    Dental  (+) Missing and Loose   Pulmonary sleep apnea , former smoker,  breath sounds clear to auscultation        Cardiovascular hypertension, + CAD, + Past MI and + Cardiac Stents Rhythm:Regular Rate:Normal     Neuro/Psych    GI/Hepatic   Endo/Other  diabetes  Renal/GU      Musculoskeletal   Abdominal (+) + obese,   Peds  Hematology   Anesthesia Other Findings   Reproductive/Obstetrics                          Anesthesia Physical Anesthesia Plan  ASA: III  Anesthesia Plan: General   Post-op Pain Management:    Induction: Intravenous  Airway Management Planned: Oral ETT  Additional Equipment: Arterial line, PA Cath and TEE  Intra-op Plan:   Post-operative Plan: Post-operative intubation/ventilation  Informed Consent: I have reviewed the patients History and Physical, chart, labs and discussed the procedure including the risks, benefits and alternatives for the proposed anesthesia with the patient or authorized representative who has indicated his/her understanding and acceptance.     Plan Discussed with: CRNA, Surgeon and Anesthesiologist  Anesthesia Plan Comments:        Anesthesia Quick Evaluation

## 2013-01-30 NOTE — Progress Notes (Signed)
Pt. Has his home CPAP with nasal mask & places himself on & off CPAP. Pt. Is currently off CPAP but will place himself back on CPAP before going to bed.

## 2013-01-30 NOTE — Progress Notes (Signed)
Patient ID: Earl Williamson, male   DOB: 11/04/1940, 72 y.o.   MRN: [id number] EVENING ROUNDS NOTE :     [address]       [place] [address]             [phone]                 Day of Surgery Procedure(s) (LRB): CORONARY ARTERY BYPASS GRAFTING (CABG) (N/A) INTRAOPERATIVE TRANSESOPHAGEAL ECHOCARDIOGRAM (N/A)  Total Length of Stay:  LOS: 5 days  BP 88/51  Pulse 81  Temp(Src) 98.4 F (36.9 C) (Oral)  Resp 18  Ht 5\' 6"  (1.676 m)  Wt 240 lb 15.4 oz (109.3 kg)  BMI 38.91 kg/m2  SpO2 99%  .Intake/Output     11/20 0701 - 11/21 0700 11/21 0701 - 11/22 0700   P.O. 560    I.V. (mL/kg) 325.9 (3) 2812.4 (25.7)   Blood  827   IV Piggyback  720   Total Intake(mL/kg) 885.9 (8.1) 4359.4 (39.9)   Urine (mL/kg/hr) 475 (0.2) 2985 (2.6)   Blood  400 (0.3)   Chest Tube  90 (0.1)   Total Output 475 3475   Net +410.9 +884.4          . sodium chloride 20 mL/hr (01/30/13 1400)  . sodium chloride 20 mL/hr at 01/30/13 1400  . [START ON 01/31/2013] sodium chloride    . dexmedetomidine 0.1 mcg/kg/hr (01/30/13 1615)  . insulin (NOVOLIN-R) infusion 2.3 Units/hr (01/30/13 1712)  . lactated ringers 20 mL/hr at 01/30/13 1400  . nitroGLYCERIN 0 mcg/min (01/30/13 1715)  . phenylephrine (NEO-SYNEPHRINE) Adult infusion 0 mcg/min (01/30/13 1532)     Lab Results  Component Value Date   WBC 5.3 01/30/2013   HGB 10.8* 01/30/2013   HCT 31.3* 01/30/2013   PLT 147* 01/30/2013   GLUCOSE 170* 01/30/2013   CHOL 150 01/26/2013   TRIG 127 01/26/2013   HDL 39* 01/26/2013   LDLCALC 86 01/26/2013   ALT 27 01/27/2013   AST 58* 01/27/2013   NA 141 01/30/2013   K 3.8 01/30/2013   CL 101 01/29/2013   CREATININE 0.96 01/29/2013   BUN 14 01/29/2013   CO2 24 01/29/2013   TSH 0.545 01/25/2013   INR 1.34 01/30/2013   HGBA1C 10.5* 01/29/2013   CABG today , still intubated bp labile Not bleeding from ct On insulin drip glucose controlled   Delight Ovens MD  Beeper  [phone] Office [phone] 01/30/2013 5:34 PM

## 2013-01-31 ENCOUNTER — Inpatient Hospital Stay (HOSPITAL_COMMUNITY): Payer: 59

## 2013-01-31 LAB — CBC
HCT: 30.3 % — ABNORMAL LOW (ref 39.0–52.0)
HCT: 31.5 % — ABNORMAL LOW (ref 39.0–52.0)
Hemoglobin: 10.3 g/dL — ABNORMAL LOW (ref 13.0–17.0)
Hemoglobin: 10.7 g/dL — ABNORMAL LOW (ref 13.0–17.0)
MCH: 28.5 pg (ref 26.0–34.0)
MCH: 29 pg (ref 26.0–34.0)
MCHC: 34 g/dL (ref 30.0–36.0)
MCHC: 34 g/dL (ref 30.0–36.0)
MCV: 83.7 fL (ref 78.0–100.0)
Platelets: 160 10*3/uL (ref 150–400)
Platelets: 156 10*3/uL (ref 150–400)
RBC: 3.69 MIL/uL — ABNORMAL LOW (ref 4.22–5.81)
RDW: 14.3 % (ref 11.5–15.5)
WBC: 8.5 10*3/uL (ref 4.0–10.5)

## 2013-01-31 LAB — BASIC METABOLIC PANEL
BUN: 11 mg/dL (ref 6–23)
Chloride: 107 mEq/L (ref 96–112)
Creatinine, Ser: 1.01 mg/dL (ref 0.50–1.35)
GFR calc non Af Amer: 73 mL/min — ABNORMAL LOW (ref >90)
Glucose, Bld: 107 mg/dL — ABNORMAL HIGH (ref 70–99)
Potassium: 4 mEq/L (ref 3.5–5.1)

## 2013-01-31 LAB — POCT I-STAT 3, ART BLOOD GAS (G3+)
Acid-base deficit: 2 mmol/L (ref 0.0–2.0)
Bicarbonate: 21.5 mEq/L (ref 20.0–24.0)
Bicarbonate: 22 mEq/L (ref 20.0–24.0)
O2 Saturation: 97 %
TCO2: 23 mmol/L (ref 0–100)
pCO2 arterial: 33.3 mmHg — ABNORMAL LOW (ref 35.0–45.0)
pCO2 arterial: 35.8 mmHg (ref 35.0–45.0)
pH, Arterial: 7.419 (ref 7.350–7.450)
pO2, Arterial: 85 mmHg (ref 80.0–100.0)
pO2, Arterial: 89 mmHg (ref 80.0–100.0)

## 2013-01-31 LAB — POCT I-STAT, CHEM 8
BUN: 9 mg/dL (ref 6–23)
Chloride: 102 mEq/L (ref 96–112)
Creatinine, Ser: 1.1 mg/dL (ref 0.50–1.35)
Glucose, Bld: 141 mg/dL — ABNORMAL HIGH (ref 70–99)
Hemoglobin: 10.5 g/dL — ABNORMAL LOW (ref 13.0–17.0)
Potassium: 4 mEq/L (ref 3.5–5.1)
Sodium: 138 mEq/L (ref 135–145)
TCO2: 23 mmol/L (ref 0–100)

## 2013-01-31 LAB — PREPARE FRESH FROZEN PLASMA: Unit division: 0

## 2013-01-31 LAB — GLUCOSE, CAPILLARY
Glucose-Capillary: 100 mg/dL — ABNORMAL HIGH (ref 70–99)
Glucose-Capillary: 101 mg/dL — ABNORMAL HIGH (ref 70–99)
Glucose-Capillary: 101 mg/dL — ABNORMAL HIGH (ref 70–99)
Glucose-Capillary: 102 mg/dL — ABNORMAL HIGH (ref 70–99)
Glucose-Capillary: 107 mg/dL — ABNORMAL HIGH (ref 70–99)
Glucose-Capillary: 116 mg/dL — ABNORMAL HIGH (ref 70–99)
Glucose-Capillary: 118 mg/dL — ABNORMAL HIGH (ref 70–99)
Glucose-Capillary: 122 mg/dL — ABNORMAL HIGH (ref 70–99)
Glucose-Capillary: 125 mg/dL — ABNORMAL HIGH (ref 70–99)
Glucose-Capillary: 130 mg/dL — ABNORMAL HIGH (ref 70–99)
Glucose-Capillary: 132 mg/dL — ABNORMAL HIGH (ref 70–99)
Glucose-Capillary: 140 mg/dL — ABNORMAL HIGH (ref 70–99)
Glucose-Capillary: 142 mg/dL — ABNORMAL HIGH (ref 70–99)
Glucose-Capillary: 160 mg/dL — ABNORMAL HIGH (ref 70–99)
Glucose-Capillary: 96 mg/dL (ref 70–99)

## 2013-01-31 LAB — PREPARE PLATELET PHERESIS: Unit division: 0

## 2013-01-31 LAB — MAGNESIUM: Magnesium: 2.9 mg/dL — ABNORMAL HIGH (ref 1.5–2.5)

## 2013-01-31 LAB — CREATININE, SERUM
Creatinine, Ser: 0.93 mg/dL (ref 0.50–1.35)
GFR calc non Af Amer: 83 mL/min — ABNORMAL LOW (ref >90)

## 2013-01-31 MED ORDER — INSULIN DETEMIR 100 UNIT/ML ~~LOC~~ SOLN
18.0000 [IU] | Freq: Every day | SUBCUTANEOUS | Status: DC
  Administered 2013-02-01 – 2013-02-02 (×2): 18 [IU] via SUBCUTANEOUS
  Filled 2013-01-31 (×3): qty 0.18

## 2013-01-31 MED ORDER — FUROSEMIDE 10 MG/ML IJ SOLN
40.0000 mg | Freq: Once | INTRAMUSCULAR | Status: AC
  Administered 2013-01-31: 40 mg via INTRAVENOUS
  Filled 2013-01-31: qty 4

## 2013-01-31 MED ORDER — INSULIN DETEMIR 100 UNIT/ML ~~LOC~~ SOLN
18.0000 [IU] | Freq: Once | SUBCUTANEOUS | Status: AC
  Administered 2013-01-31: 18 [IU] via SUBCUTANEOUS
  Filled 2013-01-31: qty 0.18

## 2013-01-31 MED ORDER — ENOXAPARIN SODIUM 30 MG/0.3ML ~~LOC~~ SOLN
30.0000 mg | SUBCUTANEOUS | Status: DC
  Administered 2013-01-31 – 2013-02-06 (×7): 30 mg via SUBCUTANEOUS
  Filled 2013-01-31 (×7): qty 0.3

## 2013-01-31 MED ORDER — INSULIN ASPART 100 UNIT/ML ~~LOC~~ SOLN
0.0000 [IU] | SUBCUTANEOUS | Status: DC
  Administered 2013-01-31 (×3): 2 [IU] via SUBCUTANEOUS
  Administered 2013-02-01: 4 [IU] via SUBCUTANEOUS
  Administered 2013-02-01 – 2013-02-02 (×5): 2 [IU] via SUBCUTANEOUS
  Administered 2013-02-02: 4 [IU] via SUBCUTANEOUS
  Administered 2013-02-02 (×2): 2 [IU] via SUBCUTANEOUS

## 2013-01-31 MED ORDER — LEVALBUTEROL TARTRATE 45 MCG/ACT IN AERO
2.0000 | INHALATION_SPRAY | Freq: Four times a day (QID) | RESPIRATORY_TRACT | Status: DC
  Administered 2013-01-31 – 2013-02-02 (×7): 2 via RESPIRATORY_TRACT
  Filled 2013-01-31: qty 15

## 2013-01-31 NOTE — Progress Notes (Addendum)
Subjective:  POD #2 CABG X3. Extubated. Mild CP  Objective:  Temp:  [96.8 F (36 C)-99.1 F (37.3 C)] 98.1 F (36.7 C) (11/22 1015) Pulse Rate:  [75-103] 86 (11/22 1015) Resp:  [12-36] 23 (11/22 1015) BP: (62-146)/(37-81) 106/57 mmHg (11/22 1015) SpO2:  [91 %-100 %] 96 % (11/22 1015) Arterial Line BP: (72-182)/(41-79) 154/67 mmHg (11/22 1015) FiO2 (%):  [40 %-50 %] 40 % (11/21 2338) Weight:  [251 lb 12.3 oz (114.2 kg)] 251 lb 12.3 oz (114.2 kg) (11/22 0500) Weight change: 10 lb 12.9 oz (4.9 kg)  Intake/Output from previous day: 11/21 0701 - 11/22 0700 In: 6255.7 [P.O.:320; I.V.:3758.7; Blood:827; NG/GT:90; IV Piggyback:1260] Out: 4365 [Urine:3625; Blood:400; Chest Tube:340]  Intake/Output from this shift: Total I/O In: 186.1 [I.V.:136.1; IV Piggyback:50] Out: 190 [Urine:140; Chest Tube:50]  Physical Exam: General appearance: alert and mild distress Neck: no adenopathy, no carotid bruit, no JVD, supple, symmetrical, trachea midline and thyroid not enlarged, symmetric, no tenderness/mass/nodules Lungs: clear to auscultation bilaterally Heart: regular rate and rhythm, S1, S2 normal, no murmur, click, rub or gallop Extremities: extremities normal, atraumatic, no cyanosis or edema  Lab Results: Results for orders placed during the hospital encounter of 01/25/13 (from the past 48 hour(s))  GLUCOSE, CAPILLARY     Status: Abnormal   Collection Time    01/29/13 12:02 PM      Result Value Range   Glucose-Capillary 186 (*) 70 - 99 mg/dL  GLUCOSE, CAPILLARY     Status: Abnormal   Collection Time    01/29/13 12:15 PM      Result Value Range   Glucose-Capillary 201 (*) 70 - 99 mg/dL   Comment 1 Notify RN    HEPARIN LEVEL (UNFRACTIONATED)     Status: None   Collection Time    01/29/13  2:07 PM      Result Value Range   Heparin Unfractionated 0.34  0.30 - 0.70 IU/mL   Comment:            IF HEPARIN RESULTS ARE BELOW     EXPECTED VALUES, AND PATIENT     DOSAGE HAS BEEN  CONFIRMED,     SUGGEST FOLLOW UP TESTING     OF ANTITHROMBIN III LEVELS.  GLUCOSE, CAPILLARY     Status: Abnormal   Collection Time    01/29/13  4:33 PM      Result Value Range   Glucose-Capillary 183 (*) 70 - 99 mg/dL  GLUCOSE, CAPILLARY     Status: Abnormal   Collection Time    01/29/13 10:29 PM      Result Value Range   Glucose-Capillary 175 (*) 70 - 99 mg/dL  CBC     Status: Abnormal   Collection Time    01/30/13  4:26 AM      Result Value Range   WBC 4.3  4.0 - 10.5 K/uL   RBC 4.45  4.22 - 5.81 MIL/uL   Hemoglobin 12.8 (*) 13.0 - 17.0 g/dL   HCT 16.1 (*) 09.6 - 04.5 %   MCV 84.5  78.0 - 100.0 fL   MCH 28.8  26.0 - 34.0 pg   MCHC 34.0  30.0 - 36.0 g/dL   RDW 40.9  81.1 - 91.4 %   Platelets 164  150 - 400 K/uL  HEPARIN LEVEL (UNFRACTIONATED)     Status: None   Collection Time    01/30/13  4:26 AM      Result Value Range   Heparin Unfractionated 0.38  0.30 - 0.70 IU/mL   Comment:            IF HEPARIN RESULTS ARE BELOW     EXPECTED VALUES, AND PATIENT     DOSAGE HAS BEEN CONFIRMED,     SUGGEST FOLLOW UP TESTING     OF ANTITHROMBIN III LEVELS.  POCT I-STAT 4, (NA,K, GLUC, HGB,HCT)     Status: Abnormal   Collection Time    01/30/13  8:02 AM      Result Value Range   Sodium 140  135 - 145 mEq/L   Potassium 3.8  3.5 - 5.1 mEq/L   Glucose, Bld 188 (*) 70 - 99 mg/dL   HCT 08.6 (*) 57.8 - 46.9 %   Hemoglobin 12.2 (*) 13.0 - 17.0 g/dL  POCT I-STAT 3, BLOOD GAS (G3+)     Status: Abnormal   Collection Time    01/30/13  8:06 AM      Result Value Range   pH, Arterial 7.349 (*) 7.350 - 7.450   pCO2 arterial 46.8 (*) 35.0 - 45.0 mmHg   pO2, Arterial 249.0 (*) 80.0 - 100.0 mmHg   Bicarbonate 25.8 (*) 20.0 - 24.0 mEq/L   TCO2 27  0 - 100 mmol/L   O2 Saturation 100.0     Sample type ARTERIAL    POCT I-STAT 4, (NA,K, GLUC, HGB,HCT)     Status: Abnormal   Collection Time    01/30/13 10:03 AM      Result Value Range   Sodium 141  135 - 145 mEq/L   Potassium 3.3 (*) 3.5 -  5.1 mEq/L   Glucose, Bld 130 (*) 70 - 99 mg/dL   HCT 62.9 (*) 52.8 - 41.3 %   Hemoglobin 10.5 (*) 13.0 - 17.0 g/dL  POCT I-STAT 4, (NA,K, GLUC, HGB,HCT)     Status: Abnormal   Collection Time    01/30/13 10:24 AM      Result Value Range   Sodium 142  135 - 145 mEq/L   Potassium 3.4 (*) 3.5 - 5.1 mEq/L   Glucose, Bld 103 (*) 70 - 99 mg/dL   HCT 24.4 (*) 01.0 - 27.2 %   Hemoglobin 8.8 (*) 13.0 - 17.0 g/dL  POCT I-STAT 3, BLOOD GAS (G3+)     Status: Abnormal   Collection Time    01/30/13 10:28 AM      Result Value Range   pH, Arterial 7.492 (*) 7.350 - 7.450   pCO2 arterial 30.5 (*) 35.0 - 45.0 mmHg   pO2, Arterial 369.0 (*) 80.0 - 100.0 mmHg   Bicarbonate 23.4  20.0 - 24.0 mEq/L   TCO2 24  0 - 100 mmol/L   O2 Saturation 100.0     Sample type ARTERIAL    POCT I-STAT 4, (NA,K, GLUC, HGB,HCT)     Status: Abnormal   Collection Time    01/30/13 11:33 AM      Result Value Range   Sodium 141  135 - 145 mEq/L   Potassium 4.3  3.5 - 5.1 mEq/L   Glucose, Bld 114 (*) 70 - 99 mg/dL   HCT 53.6 (*) 64.4 - 03.4 %   Hemoglobin 9.5 (*) 13.0 - 17.0 g/dL  HEMOGLOBIN AND HEMATOCRIT, BLOOD     Status: Abnormal   Collection Time    01/30/13 11:38 AM      Result Value Range   Hemoglobin 10.2 (*) 13.0 - 17.0 g/dL   Comment: RESULT CALLED TO, READ BACK BY AND VERIFIED WITH:  WILSON,D RN @ 1206 01/30/13 LEONARD,A   HCT 28.6 (*) 39.0 - 52.0 %   Comment: RESULT CALLED TO, READ BACK BY AND VERIFIED WITH:     WILSON,D RN @ 1206 01/30/13 LEONARD,A  PLATELET COUNT     Status: Abnormal   Collection Time    01/30/13 11:38 AM      Result Value Range   Platelets 118 (*) 150 - 400 K/uL   Comment: RESULT CALLED TO, READ BACK BY AND VERIFIED WITH:     WILSON,D RN @ 1206 01/30/13 LEONARD,A     PLATELET COUNT CONFIRMED BY SMEAR  PREPARE PLATELET PHERESIS     Status: None   Collection Time    01/30/13 11:41 AM      Result Value Range   Unit Number [id number]     Blood Component Type PLTPHER LR2       Unit division 00     Status of Unit ISSUED     Transfusion Status OK TO TRANSFUSE    PREPARE FRESH FROZEN PLASMA     Status: None   Collection Time    01/30/13 11:41 AM      Result Value Range   Unit Number [id number]     Blood Component Type THAWED PLASMA     Unit division 00     Status of Unit ISSUED     Transfusion Status OK TO TRANSFUSE    POCT I-STAT 4, (NA,K, GLUC, HGB,HCT)     Status: Abnormal   Collection Time    01/30/13 12:33 PM      Result Value Range   Sodium 141  135 - 145 mEq/L   Potassium 3.8  3.5 - 5.1 mEq/L   Glucose, Bld 170 (*) 70 - 99 mg/dL   HCT 62.9 (*) 52.8 - 41.3 %   Hemoglobin 9.9 (*) 13.0 - 17.0 g/dL  POCT I-STAT 3, BLOOD GAS (G3+)     Status: Abnormal   Collection Time    01/30/13 12:36 PM      Result Value Range   pH, Arterial 7.438  7.350 - 7.450   pCO2 arterial 34.0 (*) 35.0 - 45.0 mmHg   pO2, Arterial 168.0 (*) 80.0 - 100.0 mmHg   Bicarbonate 23.0  20.0 - 24.0 mEq/L   TCO2 24  0 - 100 mmol/L   O2 Saturation 100.0     Acid-base deficit 1.0  0.0 - 2.0 mmol/L   Sample type ARTERIAL    CBC     Status: Abnormal   Collection Time    01/30/13  2:00 PM      Result Value Range   WBC 5.3  4.0 - 10.5 K/uL   RBC 3.77 (*) 4.22 - 5.81 MIL/uL   Hemoglobin 10.8 (*) 13.0 - 17.0 g/dL   HCT 24.4 (*) 01.0 - 27.2 %   MCV 83.0  78.0 - 100.0 fL   MCH 28.6  26.0 - 34.0 pg   MCHC 34.5  30.0 - 36.0 g/dL   RDW 53.6  64.4 - 03.4 %   Platelets 147 (*) 150 - 400 K/uL  PROTIME-INR     Status: Abnormal   Collection Time    01/30/13  2:00 PM      Result Value Range   Prothrombin Time 16.3 (*) 11.6 - 15.2 seconds   INR 1.34  0.00 - 1.49  APTT     Status: None   Collection Time    01/30/13  2:00 PM      Result  Value Range   aPTT 36  24 - 37 seconds  POCT I-STAT 3, BLOOD GAS (G3+)     Status: Abnormal   Collection Time    01/30/13  2:08 PM      Result Value Range   pH, Arterial 7.459 (*) 7.350 - 7.450   pCO2 arterial 32.8 (*) 35.0 - 45.0 mmHg   pO2,  Arterial 55.0 (*) 80.0 - 100.0 mmHg   Bicarbonate 23.5  20.0 - 24.0 mEq/L   TCO2 25  0 - 100 mmol/L   O2 Saturation 91.0     Patient temperature 36.0 C     Collection site ARTERIAL LINE     Drawn by Operator     Sample type ARTERIAL    POCT I-STAT 4, (NA,K, GLUC, HGB,HCT)     Status: Abnormal   Collection Time    01/30/13  2:08 PM      Result Value Range   Sodium 143  135 - 145 mEq/L   Potassium 3.4 (*) 3.5 - 5.1 mEq/L   Glucose, Bld 145 (*) 70 - 99 mg/dL   HCT 16.1 (*) 09.6 - 04.5 %   Hemoglobin 10.9 (*) 13.0 - 17.0 g/dL  GLUCOSE, CAPILLARY     Status: Abnormal   Collection Time    01/30/13  3:15 PM      Result Value Range   Glucose-Capillary 115 (*) 70 - 99 mg/dL  GLUCOSE, CAPILLARY     Status: Abnormal   Collection Time    01/30/13  4:10 PM      Result Value Range   Glucose-Capillary 101 (*) 70 - 99 mg/dL  GLUCOSE, CAPILLARY     Status: Abnormal   Collection Time    01/30/13  5:10 PM      Result Value Range   Glucose-Capillary 117 (*) 70 - 99 mg/dL  GLUCOSE, CAPILLARY     Status: Abnormal   Collection Time    01/30/13  6:06 PM      Result Value Range   Glucose-Capillary 126 (*) 70 - 99 mg/dL  GLUCOSE, CAPILLARY     Status: Abnormal   Collection Time    01/30/13  6:56 PM      Result Value Range   Glucose-Capillary 125 (*) 70 - 99 mg/dL  GLUCOSE, CAPILLARY     Status: Abnormal   Collection Time    01/30/13  7:53 PM      Result Value Range   Glucose-Capillary 130 (*) 70 - 99 mg/dL  POCT I-STAT 3, BLOOD GAS (G3+)     Status: Abnormal   Collection Time    01/30/13  7:57 PM      Result Value Range   pH, Arterial 7.391  7.350 - 7.450   pCO2 arterial 37.1  35.0 - 45.0 mmHg   pO2, Arterial 69.0 (*) 80.0 - 100.0 mmHg   Bicarbonate 22.5  20.0 - 24.0 mEq/L   TCO2 24  0 - 100 mmol/L   O2 Saturation 93.0     Acid-base deficit 2.0  0.0 - 2.0 mmol/L   Patient temperature 37.0 C     Sample type ARTERIAL    POCT I-STAT, CHEM 8     Status: Abnormal   Collection Time     01/30/13  7:57 PM      Result Value Range   Sodium 142  135 - 145 mEq/L   Potassium 4.0  3.5 - 5.1 mEq/L   Chloride 108  96 - 112 mEq/L   BUN  9  6 - 23 mg/dL   Creatinine, Ser 1.61  0.50 - 1.35 mg/dL   Glucose, Bld 096 (*) 70 - 99 mg/dL   Calcium, Ion 0.45 (*) 1.13 - 1.30 mmol/L   TCO2 22  0 - 100 mmol/L   Hemoglobin 9.9 (*) 13.0 - 17.0 g/dL   HCT 40.9 (*) 81.1 - 91.4 %  MAGNESIUM     Status: Abnormal   Collection Time    01/30/13  8:00 PM      Result Value Range   Magnesium 3.2 (*) 1.5 - 2.5 mg/dL  CREATININE, SERUM     Status: Abnormal   Collection Time    01/30/13  8:00 PM      Result Value Range   Creatinine, Ser 0.91  0.50 - 1.35 mg/dL   GFR calc non Af Amer 83 (*) >90 mL/min   GFR calc Af Amer >90  >90 mL/min   Comment: (NOTE)     The eGFR has been calculated using the CKD EPI equation.     This calculation has not been validated in all clinical situations.     eGFR's persistently <90 mL/min signify possible Chronic Kidney     Disease.  CBC     Status: Abnormal   Collection Time    01/30/13  8:00 PM      Result Value Range   WBC 7.9  4.0 - 10.5 K/uL   RBC 3.65 (*) 4.22 - 5.81 MIL/uL   Hemoglobin 10.5 (*) 13.0 - 17.0 g/dL   HCT 78.2 (*) 95.6 - 21.3 %   MCV 83.3  78.0 - 100.0 fL   MCH 28.8  26.0 - 34.0 pg   MCHC 34.5  30.0 - 36.0 g/dL   RDW 08.6  57.8 - 46.9 %   Platelets 169  150 - 400 K/uL  GLUCOSE, CAPILLARY     Status: Abnormal   Collection Time    01/30/13  9:04 PM      Result Value Range   Glucose-Capillary 135 (*) 70 - 99 mg/dL  GLUCOSE, CAPILLARY     Status: Abnormal   Collection Time    01/30/13  9:55 PM      Result Value Range   Glucose-Capillary 160 (*) 70 - 99 mg/dL  GLUCOSE, CAPILLARY     Status: Abnormal   Collection Time    01/30/13 10:56 PM      Result Value Range   Glucose-Capillary 142 (*) 70 - 99 mg/dL  POCT I-STAT 3, BLOOD GAS (G3+)     Status: None   Collection Time    01/31/13 12:03 AM      Result Value Range   pH, Arterial 7.397   7.350 - 7.450   pCO2 arterial 35.8  35.0 - 45.0 mmHg   pO2, Arterial 85.0  80.0 - 100.0 mmHg   Bicarbonate 22.0  20.0 - 24.0 mEq/L   TCO2 23  0 - 100 mmol/L   O2 Saturation 96.0     Acid-base deficit 2.0  0.0 - 2.0 mmol/L   Patient temperature 37.3 C     Sample type ARTERIAL    GLUCOSE, CAPILLARY     Status: Abnormal   Collection Time    01/31/13 12:05 AM      Result Value Range   Glucose-Capillary 122 (*) 70 - 99 mg/dL  POCT I-STAT 3, BLOOD GAS (G3+)     Status: Abnormal   Collection Time    01/31/13  2:47 AM  Result Value Range   pH, Arterial 7.419  7.350 - 7.450   pCO2 arterial 33.3 (*) 35.0 - 45.0 mmHg   pO2, Arterial 89.0  80.0 - 100.0 mmHg   Bicarbonate 21.5  20.0 - 24.0 mEq/L   TCO2 23  0 - 100 mmol/L   O2 Saturation 97.0     Acid-base deficit 2.0  0.0 - 2.0 mmol/L   Patient temperature 36.9 C     Sample type ARTERIAL    CBC     Status: Abnormal   Collection Time    01/31/13  4:00 AM      Result Value Range   WBC 8.1  4.0 - 10.5 K/uL   RBC 3.62 (*) 4.22 - 5.81 MIL/uL   Hemoglobin 10.3 (*) 13.0 - 17.0 g/dL   HCT 16.1 (*) 09.6 - 04.5 %   MCV 83.7  78.0 - 100.0 fL   MCH 28.5  26.0 - 34.0 pg   MCHC 34.0  30.0 - 36.0 g/dL   RDW 40.9  81.1 - 91.4 %   Platelets 160  150 - 400 K/uL  BASIC METABOLIC PANEL     Status: Abnormal   Collection Time    01/31/13  4:00 AM      Result Value Range   Sodium 140  135 - 145 mEq/L   Potassium 4.0  3.5 - 5.1 mEq/L   Chloride 107  96 - 112 mEq/L   CO2 22  19 - 32 mEq/L   Glucose, Bld 107 (*) 70 - 99 mg/dL   BUN 11  6 - 23 mg/dL   Creatinine, Ser 7.82  0.50 - 1.35 mg/dL   Calcium 7.8 (*) 8.4 - 10.5 mg/dL   GFR calc non Af Amer 73 (*) >90 mL/min   GFR calc Af Amer 84 (*) >90 mL/min   Comment: (NOTE)     The eGFR has been calculated using the CKD EPI equation.     This calculation has not been validated in all clinical situations.     eGFR's persistently <90 mL/min signify possible Chronic Kidney     Disease.  MAGNESIUM      Status: Abnormal   Collection Time    01/31/13  4:00 AM      Result Value Range   Magnesium 2.9 (*) 1.5 - 2.5 mg/dL    Imaging: Imaging results have been reviewed  Assessment/Plan:   1. Principal Problem: 2.   NSTEMI (non-ST elevated myocardial infarction) 3. Active Problems: 4.   CAD S/P percutaneous coronary angioplasty - PCI  x 3 stents @ [facility] 5.   DM type 2 (diabetes mellitus, type 2) 6.   HTN (hypertension), benign 7.   HLD (hyperlipidemia) 8.   Time Spent Directly with Patient:  20 minutes  Length of Stay:  LOS: 6 days   Admitted with NSTEMI. 3VD with mod severe LVD (EF 30-35% by 2D). S/P CABG X3. Looks good. Hemodynamically stable. VSS (actually hypertensive). C.O great on Milrinone. Labs OK. Getting de- lined today. Titrate BB. Was on Ramipril 10mg  daily at home, Scr nl.  Nl progression.  Runell Gess 01/31/2013, 11:13 AM

## 2013-01-31 NOTE — Op Note (Signed)
Earl Williamson, Earl Williamson NO.:  [id number]  MEDICAL RECORD NO.:  [id number]  LOCATION:  2S01C                        FACILITY:  [facility]  PHYSICIAN:  Kerin Perna, M.D.  DATE OF BIRTH:  [birth year]  DATE OF PROCEDURE:  01/30/2013 DATE OF DISCHARGE:                              OPERATIVE REPORT   OPERATION: 1. Coronary artery bypass grafting x3 (left internal mammary artery to     LAD, saphenous vein graft to posterior descending, saphenous vein     graft to OM1). 2. Endoscopic harvest of left leg greater saphenous vein.  SURGEON:  Kerin Perna, M.D.  ASSISTANT:  Coral Ceo, PA-C.  PREOPERATIVE DIAGNOSIS:  Acute myocardial infarction with severe 3- vessel coronary artery disease, unstable angina, reduced left ventricular function.  POSTOPERATIVE DIAGNOSIS:  Acute myocardial infarction with severe 3- vessel coronary artery disease, unstable angina, reduced left ventricular function.  ANESTHESIA:  General by Dr. Bedelia Person.  INDICATIONS:  The patient is a 72 year old obese male who presented to the hospital with intermittent stuttering chest pain but of increased intensity and was found have acute EKG changes and elevated myocardial enzymes.  Cardiac catheterization was undertaken.  This showed severe 3- vessel disease with 95% stenosis of the right coronary with diffuse distal disease, 90% stenosis of the mid to proximal LAD with diffuse distal disease and distal vessel occlusion, and moderate proximal circumflex disease.  The patient had previously been treated with PCI when he presented to [facility] in 2007 with acute MI and stents were placed in the circumflex and right coronaries.  He had done well until recently.  A 2D echocardiogram showed reduced LVEF of 30% with inferior apical hypokinesia and akinesia.  There was no significant valve disease.  Prior to surgery, I reviewed the results of the cardiac catheterization with the patient and  discussed the indications and expected benefits of coronary artery bypass grafting for treatment of his severe coronary artery disease, recent MI, and LV dysfunction.  I discussed with him the major aspects of surgery including the use of general anesthesia and cardiopulmonary bypass, the expected postoperative hospital recovery, and the alternatives to surgical therapy.  I reviewed with the patient major aspects of surgery including the location of the surgical incisions, and the plan to use endoscopically harvested saphenous vein and internal mammary artery as conduit.  I discussed with the patient the specific risks to him of coronary bypass grafting including risks of bleeding, stroke, recurrent MI, multisystem failure, atrial or ventricular arrhythmias, infection, and death.  He understood that with recent Plavix used that he has been on since 2007, he would be at increased risk for bleeding complications or blood transfusion requirement.  After reviewing these issues, he demonstrated his understanding and agreed to proceed with surgery under what I felt was an informed consent.  OPERATIVE FINDINGS: 1. Adequate conduit. 2. Inferior apical scarring from an old myocardial infarction probably     corresponding to the event in 2007. 3. Poor coronary vessel quality and targets, would not be candidate     for redo surgical revascularization due to heavily diseased diffuse     vessels of poor quality. 4. The patient did  not require blood cell transfusion for the surgery.  OPERATIVE PROCEDURE:  The patient was brought to the operating room and placed supine on the operating table.  General anesthesia was induced under invasive hemodynamic monitoring.  A transesophageal echo probe was placed by the anesthesiologist.  The chest abdomen and legs were prepped with Betadine and draped as a sterile field.  A sternal incision was made as the saphenous vein was harvested endoscopically from the  left leg.  The left internal mammary artery was harvested as a pedicle graft from its origin at the subclavian vessels.  It was a 1.5-mm vessel with adequate flow.  The sternal retractor was placed using the deep blades due to the patient's obese body habitus.  The pericardium was opened and suspended.  Pursestrings were placed in the ascending aorta and right atrium and after heparin had been administered and the ACT documented as being therapeutic, the patient was cannulated and placed on cardiopulmonary bypass.  The coronaries were identified for grafting. They were doubled up upon a good place to graft the LAD and the posterior descending.  The heart was significantly enlarged and exposure was difficult due to the deep chest from obesity.  Cardioplegia cannulas were placed in both antegrade and retrograde cold blood cardioplegia and the patient was cooled to 32 degrees.  The crossclamp was applied and 1 L of cold blood cardioplegia was delivered in split doses between the antegrade aortic and retrograde coronary sinus catheters.  There was good cardioplegic arrest and septal temperature dropped less than 12 degrees.  Cardioplegia was delivered every 12 minutes or less while the crossclamp was in place.  The distal coronary anastomoses were performed.  First distal anastomosis was to the posterior descending branch of the right coronary.  This had a high-grade proximal stenosis as well as a 90%-95% stenosis just before the bifurcation.  There was also sub total stenosis of the proximal posterior descending.  The anastomosis was placed distal to the diseased segment of the posterior descending with a reverse saphenous vein was sewn end-to-side with running 7-0 Prolene. There was good flow through the graft.  The second distal anastomosis was the OM branch of the circumflex.  This had a patent but diseased proximal stent.  A reverse saphenous vein was sewn end-to-side to this 1.5-mm  vessel with excellent flow through the graft.  Cardioplegia was redosed.  The third distal anastomosis was to the distal third of the LAD.  The LAD was heavily calcified, but when we replaced anastomosis, it was widely patent proximally and distally.  The left IMA pedicle was brought through an opening in the left lateral pericardium and was brought down onto the LAD and sewn end-to-side with running 8-0 Prolene.  There was good flow through the anastomosis with briefly releasing the pedicle bulldog on the mammary artery.  The bulldog was reapplied and pedicle secured at the epicardium with 6-0 Prolene.  Cardioplegia was redosed.  While the crossclamp was in place, 2 proximal vein anastomoses were performed on the ascending aorta using a 4.5-mm punch and running 6-0 Prolene.  Prior to releasing the crossclamp, air was vented from the coronaries with a dose of retrograde warm blood cardioplegia.  The crossclamp was then removed.  The heart resumed a spontaneous rhythm without the need for cardioversion.  The vein grafts were de-aired and then opened and each had good flow and hemostasis was documented at the proximal distal anastomoses.  The patient was rewarmed to 37 degrees.  Temporary  pacing wires were applied.  The lungs re-expanded and the ventilator was resumed.  When the patient was adequately rewarmed and reperfused, he was weaned from bypass on low-dose dopamine and milrinone.  The patient had notably significant pulmonary hypertension after going to sleep and his global LV dysfunction was noted on his preoperative transesophageal echo.  After coming off bypass, the LV function was maintained and slightly improved.  The cannulas were removed and protamine was administered without adverse reaction.  The mediastinum was irrigated.  The platelet count returned low and the patient was given unit of platelets for adequate hemostasis and previous Plavix use.  The leg incision was  irrigated and closed in a standard fashion.  The chest was closed with interrupted steel wire.  The pectoralis fascia was closed with a running #1 Vicryl.  The subcutaneous and skin layers were closed in running Vicryl and sterile dressings were applied.  Total cardiopulmonary bypass time was 120 minutes.     Kerin Perna, M.D.     PV/MEDQ  D:  01/30/2013  T:  01/31/2013  Job:  [id number]  cc:   Landry Corporal, MD

## 2013-01-31 NOTE — Procedures (Signed)
Extubation Procedure Note  Patient Details:   Name: Earl Williamson DOB: 1940-12-02 MRN: [id number]   Airway Documentation:  Airway 8 mm (Active)  Secured at (cm) 23 cm 01/30/2013 11:16 PM  Measured From Lips 01/30/2013 11:16 PM  Secured Location Right 01/30/2013 11:16 PM  Secured By Caron Presume Tape 01/30/2013 11:16 PM  Cuff Pressure (cm H2O) 26 cm H2O 01/30/2013  1:45 PM  Site Condition Dry 01/30/2013 11:16 PM    Evaluation  O2 sats: stable throughout Complications: No apparent complications Patient did tolerate procedure well. Bilateral Breath Sounds: Diminished Suctioning: Airway Yes  Pt was extubated at 00:10 to a 4L [place]. Pt was stable throughout procedure with no apparent complications. NIF -35 cmH2O and VC 1.2L with a positive cuff leak.   Gaetano Hawthorne 01/31/2013, 12:15 AM

## 2013-01-31 NOTE — Progress Notes (Signed)
Patient ID: Earl Williamson, male   DOB: 08/21/1940, 72 y.o.   MRN: [id number] TCTS DAILY ICU PROGRESS NOTE                   [address]            [place] [address]          [phone]   1 Day Post-Op Procedure(s) (LRB): CORONARY ARTERY BYPASS GRAFTING (CABG) (N/A) INTRAOPERATIVE TRANSESOPHAGEAL ECHOCARDIOGRAM (N/A)  Total Length of Stay:  LOS: 6 days   Subjective: Awake and alert, neuro intact, extubated last night  Objective: Vital signs in last 24 hours: Temp:  [96.8 F (36 C)-99.1 F (37.3 C)] 97.7 F (36.5 C) (11/22 0700) Pulse Rate:  [75-103] 87 (11/22 0700) Cardiac Rhythm:  [-] Normal sinus rhythm (11/22 0400) Resp:  [12-36] 23 (11/22 0700) BP: (62-146)/(37-81) 115/70 mmHg (11/22 0700) SpO2:  [91 %-100 %] 96 % (11/22 0700) Arterial Line BP: (72-169)/(41-79) 154/58 mmHg (11/22 0700) FiO2 (%):  [40 %-50 %] 40 % (11/21 2338) Weight:  [251 lb 12.3 oz (114.2 kg)] 251 lb 12.3 oz (114.2 kg) (11/22 0500)  Filed Weights   01/29/13 0500 01/30/13 0500 01/31/13 0500  Weight: 239 lb 10.2 oz (108.7 kg) 240 lb 15.4 oz (109.3 kg) 251 lb 12.3 oz (114.2 kg)    Weight change: 10 lb 12.9 oz (4.9 kg)   Hemodynamic parameters for last 24 hours: PAP: (30-76)/(12-31) 46/17 mmHg CO:  [3.4 L/min-7.5 L/min] 5.6 L/min CI:  [1.6 L/min/m2-3.5 L/min/m2] 2.6 L/min/m2  Intake/Output from previous day: 11/21 0701 - 11/22 0700 In: 6255.7 [P.O.:320; I.V.:3758.7; Blood:827; NG/GT:90; IV Piggyback:1260] Out: 4365 [Urine:3625; Blood:400; Chest Tube:340]  Intake/Output this shift:    Current Meds: Scheduled Meds: . acetaminophen  1,000 mg Oral Q6H   Or  . acetaminophen (TYLENOL) oral liquid 160 mg/5 mL  1,000 mg Per Tube Q6H  . aspirin EC  325 mg Oral Daily   Or  . aspirin  324 mg Per Tube Daily  . bisacodyl  10 mg Oral Daily   Or  . bisacodyl  10 mg Rectal Daily  . budesonide-formoterol  2 puff Inhalation BID  . cefUROXime (ZINACEF)  IV  1.5 g Intravenous Q12H  .  docusate sodium  200 mg Oral Daily  . famotidine (PEPCID) IV  20 mg Intravenous Q12H  . insulin regular  0-10 Units Intravenous TID WC  . levalbuterol  2 puff Inhalation Q6H  . metoprolol tartrate  12.5 mg Oral BID   Or  . metoprolol tartrate  12.5 mg Per Tube BID  . [START ON 02/01/2013] pantoprazole  40 mg Oral Daily  . simvastatin  40 mg Oral Daily  . sodium chloride  3 mL Intravenous Q12H   Continuous Infusions: . sodium chloride 20 mL/hr (01/30/13 1400)  . sodium chloride 20 mL/hr at 01/30/13 1400  . sodium chloride    . dexmedetomidine Stopped (01/30/13 1630)  . DOPamine Stopped (01/31/13 0000)  . insulin (NOVOLIN-R) infusion 2.1 mL/hr at 01/31/13 0600  . lactated ringers 20 mL/hr at 01/30/13 1900  . milrinone 0.2 mcg/kg/min (01/30/13 1915)  . nitroGLYCERIN 75 mcg/min (01/31/13 0445)  . phenylephrine (NEO-SYNEPHRINE) Adult infusion Stopped (01/31/13 0000)   PRN Meds:.albumin human, metoprolol, midazolam, morphine injection, ondansetron (ZOFRAN) IV, oxyCODONE, sodium chloride  General appearance: alert and cooperative Neurologic: intact Heart: regular rate and rhythm, S1, S2 normal, no murmur, click, rub or gallop Lungs: clear to auscultation bilaterally Abdomen: soft, non-tender; bowel sounds normal;  no masses,  no organomegaly Extremities: extremities normal, atraumatic, no cyanosis or edema and Homans sign is negative, no sign of DVT  Lab Results: CBC: Recent Labs  01/30/13 2000 01/31/13 0400  WBC 7.9 8.1  HGB 10.5* 10.3*  HCT 30.4* 30.3*  PLT 169 160   BMET:  Recent Labs  01/29/13 0946  01/30/13 1957 01/30/13 2000 01/31/13 0400  NA 137  < > 142  --  140  K 3.6  < > 4.0  --  4.0  CL 101  --  108  --  107  CO2 24  --   --   --  22  GLUCOSE 238*  < > 138*  --  107*  BUN 14  --  9  --  11  CREATININE 0.96  --  0.80 0.91 1.01  CALCIUM 8.7  --   --   --  7.8*  < > = values in this interval not displayed.  PT/INR:  Recent Labs  01/30/13 1400  LABPROT  16.3*  INR 1.34   Radiology: Dg Chest Portable 1 View  01/30/2013   CLINICAL DATA:  Post CABG  EXAM: PORTABLE CHEST - 1 VIEW  COMPARISON:  Portable exam 1409 hr compared to 01/25/2013  FINDINGS: Tip of endotracheal tube projects approximately 2.8 cm above carina.  Nasogastric tube extends into stomach.  Right jugular Swan-Ganz catheter tip projects over distal main pulmonary artery near bifurcation.  Mediastinal drain and left thoracostomy tube present.  Enlargement of cardiac silhouette post CABG.  Bibasilar atelectasis and question small left pleural effusion.  Remaining lungs clear.  No pneumothorax.  IMPRESSION: Line and tube positions as above.  Enlargement of cardiac silhouette post interval CABG.  Bibasilar atelectasis and question small left pleural effusion.   Electronically Signed   By: Ulyses Southward M.D.   On: 01/30/2013 14:24     Assessment/Plan: S/P Procedure(s) (LRB): CORONARY ARTERY BYPASS GRAFTING (CABG) (N/A) INTRAOPERATIVE TRANSESOPHAGEAL ECHOCARDIOGRAM (N/A) Mobilize Diuresis Diabetes control- continue insulin Continue foley due to strict I&O and patient in ICU See progression orders     Stina Gane B 01/31/2013 7:42 AM

## 2013-01-31 NOTE — Op Note (Signed)
NAMEDAYVEON, HALLEY NO.:  [id number]  MEDICAL RECORD NO.:  [id number]  LOCATION:  2S01C                        FACILITY:  [facility]  PHYSICIAN:  Bedelia Person, M.D.        DATE OF BIRTH:  05-23-1940  DATE OF PROCEDURE:  01/30/2013 DATE OF DISCHARGE:                              OPERATIVE REPORT   Mr. Geier is a 72 year old male with significant coronary artery disease and recent myocardial infarction.  TEE will be used intraoperatively to assess left ventricular function as well as valvular function, both pre and post bypass examinations.  The patient has no history of esophageal or gastric pathology.  After induction, the airway was secured with an oral endotracheal tube and an orogastric tube was used to remove gastric contents.  The transesophageal transducer was heavily lubricated and placed blindly down the oropharynx with no significant resistance.  The prebypass examination revealed the left ventricle to be dilated. There was mild hypertrophy concentrically.  Segmental defects included severe hypokinesis of the septal wall and akinesis of the inferior wall. Overall, ejection fraction was reduced.  Left atrium was slightly enlarged.  The appendage was clean.  Interatrial septum was intact by color Doppler.  Mitral valve leaflets opened and closed appropriately. No masses or vegetations were detected.  Color Doppler revealed a trace central mitral regurgitant flow pattern.  The aortic valve had 3 leaflets.  There were no calcifications noted.  Color Doppler revealed no aortic insufficiency.  Right ventricle showed good contractility. Tricuspid valve, and the Swan-Ganz catheter present and showed mild tricuspid regurgitation on color Doppler.  The aorta was normal size and appearance.  The patient underwent coronary artery bypass grafting after the bypass.  The post examination was on inotropic dopamine and milrinone.  The left ventricle now showed dynamic  contractility of the anterior and lateral walls.  There was continued hypokinesis of the septum and akinesis of the inferior walls.  No new segmental defects were detected.  The mitral valve continued to show only a trace central mitral regurgitant flow, and the aortic valve and right heart examination were essentially unchanged from the prebypass exam.  There were no other new significant findings in the post bypass examination.          ______________________________ Bedelia Person, M.D.     LK/MEDQ  D:  01/30/2013  T:  01/31/2013  Job:  [id number]

## 2013-01-31 NOTE — Progress Notes (Signed)
Patient ID: Earl Williamson, male   DOB: 02-07-1941, 72 y.o.   MRN: [id number] EVENING ROUNDS NOTE :     [address]       [place] [address]             [phone]                 1 Day Post-Op Procedure(s) (LRB): CORONARY ARTERY BYPASS GRAFTING (CABG) (N/A) INTRAOPERATIVE TRANSESOPHAGEAL ECHOCARDIOGRAM (N/A)  Total Length of Stay:  LOS: 6 days  BP 118/72  Pulse 83  Temp(Src) 98.1 F (36.7 C) (Oral)  Resp 25  Ht 5\' 6"  (1.676 m)  Wt 251 lb 12.3 oz (114.2 kg)  BMI 40.66 kg/m2  SpO2 96%  .Intake/Output     11/21 0701 - 11/22 0700 11/22 0701 - 11/23 0700   P.O. 320 50   I.V. (mL/kg) 3758.7 (32.9) 433.4 (3.8)   Blood 827    NG/GT 90    IV Piggyback 1260 50   Total Intake(mL/kg) 6255.7 (54.8) 533.4 (4.7)   Urine (mL/kg/hr) 3625 (1.3) 950 (0.7)   Blood 400 (0.1)    Chest Tube 340 (0.1) 110 (0.1)   Total Output 4365 1060   Net +1890.7 -526.6          . sodium chloride 20 mL/hr (01/30/13 1400)  . sodium chloride 20 mL/hr at 01/30/13 1400  . sodium chloride    . DOPamine Stopped (01/31/13 0000)  . insulin (NOVOLIN-R) infusion 2.1 mL/hr at 01/31/13 0600  . lactated ringers 20 mL/hr at 01/30/13 1900  . nitroGLYCERIN Stopped (01/31/13 1635)  . phenylephrine (NEO-SYNEPHRINE) Adult infusion Stopped (01/31/13 0000)     Lab Results  Component Value Date   WBC 8.5 01/31/2013   HGB 10.7* 01/31/2013   HCT 31.5* 01/31/2013   PLT 156 01/31/2013   GLUCOSE 141* 01/31/2013   CHOL 150 01/26/2013   TRIG 127 01/26/2013   HDL 39* 01/26/2013   LDLCALC 86 01/26/2013   ALT 27 01/27/2013   AST 58* 01/27/2013   NA 138 01/31/2013   K 4.0 01/31/2013   CL 102 01/31/2013   CREATININE 0.93 01/31/2013   BUN 9 01/31/2013   CO2 22 01/31/2013   TSH 0.545 01/25/2013   INR 1.34 01/30/2013   HGBA1C 10.5* 01/29/2013   Stable, up to chair   Delight Ovens MD  Beeper [phone] Office [phone] 01/31/2013 6:56 PM

## 2013-02-01 ENCOUNTER — Inpatient Hospital Stay (HOSPITAL_COMMUNITY): Payer: 59

## 2013-02-01 LAB — GLUCOSE, CAPILLARY
Glucose-Capillary: 127 mg/dL — ABNORMAL HIGH (ref 70–99)
Glucose-Capillary: 119 mg/dL — ABNORMAL HIGH (ref 70–99)
Glucose-Capillary: 149 mg/dL — ABNORMAL HIGH (ref 70–99)
Glucose-Capillary: 137 mg/dL — ABNORMAL HIGH (ref 70–99)
Glucose-Capillary: 188 mg/dL — ABNORMAL HIGH (ref 70–99)
Glucose-Capillary: 114 mg/dL — ABNORMAL HIGH (ref 70–99)
Glucose-Capillary: 154 mg/dL — ABNORMAL HIGH (ref 70–99)

## 2013-02-01 LAB — CBC
HCT: 28.6 % — ABNORMAL LOW (ref 39.0–52.0)
Hemoglobin: 10 g/dL — ABNORMAL LOW (ref 13.0–17.0)
MCH: 29.5 pg (ref 26.0–34.0)
MCHC: 35 g/dL (ref 30.0–36.0)
MCV: 84.4 fL (ref 78.0–100.0)
Platelets: 135 10*3/uL — ABNORMAL LOW (ref 150–400)
RBC: 3.39 MIL/uL — ABNORMAL LOW (ref 4.22–5.81)
RDW: 14.6 % (ref 11.5–15.5)
WBC: 8.2 10*3/uL (ref 4.0–10.5)

## 2013-02-01 LAB — BASIC METABOLIC PANEL
BUN: 11 mg/dL (ref 6–23)
CO2: 25 mEq/L (ref 19–32)
Calcium: 8.1 mg/dL — ABNORMAL LOW (ref 8.4–10.5)
Chloride: 101 mEq/L (ref 96–112)
Creatinine, Ser: 0.98 mg/dL (ref 0.50–1.35)
GFR calc Af Amer: >90 mL/min (ref >90)
GFR calc non Af Amer: 81 mL/min — ABNORMAL LOW (ref >90)
Glucose, Bld: 147 mg/dL — ABNORMAL HIGH (ref 70–99)
Potassium: 4.2 mEq/L (ref 3.5–5.1)
Sodium: 137 mEq/L (ref 135–145)

## 2013-02-01 MED ORDER — FUROSEMIDE 10 MG/ML IJ SOLN: 40.0000 mg | Freq: Once | INTRAMUSCULAR | Status: DC

## 2013-02-01 MED ORDER — FUROSEMIDE 10 MG/ML IJ SOLN
40.0000 mg | Freq: Once | INTRAMUSCULAR | Status: AC
  Administered 2013-02-01: 40 mg via INTRAVENOUS
  Filled 2013-02-01: qty 4

## 2013-02-01 NOTE — Progress Notes (Signed)
Patient refuses CPAP tonight, stating he feels too sweaty and uncomfortable to tolerate it.

## 2013-02-01 NOTE — Progress Notes (Signed)
Patient ID: Earl Williamson, male   DOB: [birth year], 72 y.o.   MRN: [id number] TCTS DAILY ICU PROGRESS NOTE                   [address]            [place] [address]          [phone]   2 Days Post-Op Procedure(s) (LRB): CORONARY ARTERY BYPASS GRAFTING (CABG) (N/A) INTRAOPERATIVE TRANSESOPHAGEAL ECHOCARDIOGRAM (N/A)  Total Length of Stay:  LOS: 7 days   Subjective: Walked some, no complaints, but mild distention of abdomen   Objective: Vital signs in last 24 hours: Temp:  [97.8 F (36.6 C)-98.7 F (37.1 C)] 98.5 F (36.9 C) (11/23 0737) Pulse Rate:  [73-92] 90 (11/23 0900) Cardiac Rhythm:  [-] Normal sinus rhythm (11/23 0800) Resp:  [18-38] 28 (11/23 0900) BP: (105-171)/(52-86) 144/65 mmHg (11/23 0900) SpO2:  [90 %-100 %] 94 % (11/23 0909) Arterial Line BP: (148-215)/(63-85) 180/69 mmHg (11/22 1445) Weight:  [249 lb 1.9 oz (113 kg)] 249 lb 1.9 oz (113 kg) (11/23 0500)  Filed Weights   01/30/13 0500 01/31/13 0500 02/01/13 0500  Weight: 240 lb 15.4 oz (109.3 kg) 251 lb 12.3 oz (114.2 kg) 249 lb 1.9 oz (113 kg)    Weight change: -2 lb 10.3 oz (-1.2 kg)   Hemodynamic parameters for last 24 hours: PAP: (50-65)/(22-31) 63/30 mmHg  Intake/Output from previous day: 11/22 0701 - 11/23 0700 In: 864.4 [P.O.:50; I.V.:714.4; IV Piggyback:100] Out: 2125 [Urine:2015; Chest Tube:110]  Intake/Output this shift: Total I/O In: 1607.5 [P.O.:360; I.V.:1197.5; IV Piggyback:50] Out: 50 [Urine:50]  Current Meds: Scheduled Meds: . acetaminophen  1,000 mg Oral Q6H   Or  . acetaminophen (TYLENOL) oral liquid 160 mg/5 mL  1,000 mg Per Tube Q6H  . aspirin EC  325 mg Oral Daily   Or  . aspirin  324 mg Per Tube Daily  . bisacodyl  10 mg Oral Daily   Or  . bisacodyl  10 mg Rectal Daily  . budesonide-formoterol  2 puff Inhalation BID  . docusate sodium  200 mg Oral Daily  . enoxaparin (LOVENOX) injection  30 mg Subcutaneous Q24H  . furosemide  40 mg Intravenous  Once  . insulin aspart  0-24 Units Subcutaneous Q4H  . insulin detemir  18 Units Subcutaneous Daily  . levalbuterol  2 puff Inhalation Q6H  . metoprolol tartrate  12.5 mg Oral BID   Or  . metoprolol tartrate  12.5 mg Per Tube BID  . pantoprazole  40 mg Oral Daily  . simvastatin  40 mg Oral Daily  . sodium chloride  3 mL Intravenous Q12H   Continuous Infusions: . sodium chloride Stopped (02/01/13 0900)  . sodium chloride Stopped (02/01/13 0900)  . sodium chloride    . lactated ringers 20 mL/hr at 02/01/13 0900  . nitroGLYCERIN Stopped (02/01/13 0800)  . phenylephrine (NEO-SYNEPHRINE) Adult infusion Stopped (01/31/13 0000)   PRN Meds:.metoprolol, morphine injection, ondansetron (ZOFRAN) IV, oxyCODONE, sodium chloride  General appearance: alert, cooperative and no distress Neurologic: intact Heart: regular rate and rhythm, S1, S2 normal, no murmur, click, rub or gallop Lungs: diminished breath sounds bibasilar Abdomen: hypoactive, mild distention Extremities: extremities normal, atraumatic, no cyanosis or edema and Homans sign is negative, no sign of DVT Wound: sternum stable  Lab Results: CBC: Recent Labs  01/31/13 1650 02/01/13 0410  WBC 8.5 8.2  HGB 10.7* 10.0*  HCT 31.5* 28.6*  PLT 156 135*   BMET:  Recent Labs  01/31/13 0400 01/31/13 1648 01/31/13 1650 02/01/13 0410  NA 140 138  --  137  K 4.0 4.0  --  4.2  CL 107 102  --  101  CO2 22  --   --  25  GLUCOSE 107* 141*  --  147*  BUN 11 9  --  11  CREATININE 1.01 1.10 0.93 0.98  CALCIUM 7.8*  --   --  8.1*    PT/INR:  Recent Labs  01/30/13 1400  LABPROT 16.3*  INR 1.34   Radiology: Dg Chest Port 1 View  02/01/2013   CLINICAL DATA:  Status post coronary artery bypass graft.  EXAM: PORTABLE CHEST - 1 VIEW  COMPARISON:  January 31, 2013.  FINDINGS: Stable cardiomegaly. Sternotomy wires are noted. Right lung is clear. Right internal jugular Swan-Ganz catheter has been removed and sheath remains in place.  Left-sided chest tube has been removed without evidence of pneumothorax. Contusion remains and left midlung region most likely related to previous chest tube. Left basilar opacity is again noted and unchanged.  IMPRESSION: No pneumothorax noted status post left-sided chest tube removal. Left basilar opacity remains concerning for atelectasis and possible associated pleural effusion.   Electronically Signed   By: Roque Lias M.D.   On: 02/01/2013 07:20   Dg Chest Portable 1 View In Am  01/31/2013   CLINICAL DATA:  Status post coronary artery bypass graft.  EXAM: PORTABLE CHEST - 1 VIEW  COMPARISON:  January 30, 2013.  FINDINGS: Stable cardiomegaly. Sternotomy wires are noted. The endotracheal and nasogastric tubes have been removed. Right internal jugular Swan-Ganz catheter is unchanged with tip in expected position of main pulmonary artery. Left-sided chest tube is noted without pneumothorax. Left basilar opacity is noted and unchanged. Minimal right basilar opacity is also unchanged.  IMPRESSION: Left-sided chest tube remains without evidence of pneumothorax. Stable bilateral basilar opacities with left greater than right.   Electronically Signed   By: Roque Lias M.D.   On: 01/31/2013 07:49   Dg Chest Portable 1 View  01/30/2013   CLINICAL DATA:  Post CABG  EXAM: PORTABLE CHEST - 1 VIEW  COMPARISON:  Portable exam 1409 hr compared to 01/25/2013  FINDINGS: Tip of endotracheal tube projects approximately 2.8 cm above carina.  Nasogastric tube extends into stomach.  Right jugular Swan-Ganz catheter tip projects over distal main pulmonary artery near bifurcation.  Mediastinal drain and left thoracostomy tube present.  Enlargement of cardiac silhouette post CABG.  Bibasilar atelectasis and question small left pleural effusion.  Remaining lungs clear.  No pneumothorax.  IMPRESSION: Line and tube positions as above.  Enlargement of cardiac silhouette post interval CABG.  Bibasilar atelectasis and question  small left pleural effusion.   Electronically Signed   By: Ulyses Southward M.D.   On: 01/30/2013 14:24     Assessment/Plan: S/P Procedure(s) (LRB): CORONARY ARTERY BYPASS GRAFTING (CABG) (N/A) INTRAOPERATIVE TRANSESOPHAGEAL ECHOCARDIOGRAM (N/A) Mobilize Diuresis Diabetes control mild ileus likly, get kub D/c foley    Sanita Estrada B 02/01/2013 9:50 AM

## 2013-02-02 ENCOUNTER — Inpatient Hospital Stay (HOSPITAL_COMMUNITY): Payer: 59

## 2013-02-02 LAB — GLUCOSE, CAPILLARY
Glucose-Capillary: 127 mg/dL — ABNORMAL HIGH (ref 70–99)
Glucose-Capillary: 129 mg/dL — ABNORMAL HIGH (ref 70–99)
Glucose-Capillary: 158 mg/dL — ABNORMAL HIGH (ref 70–99)
Glucose-Capillary: 168 mg/dL — ABNORMAL HIGH (ref 70–99)
Glucose-Capillary: 94 mg/dL (ref 70–99)

## 2013-02-02 LAB — TYPE AND SCREEN
ABO/RH(D): O POS
Antibody Screen: NEGATIVE
Unit division: 0
Unit division: 0

## 2013-02-02 LAB — BASIC METABOLIC PANEL
BUN: 13 mg/dL (ref 6–23)
CO2: 26 mEq/L (ref 19–32)
Calcium: 8.1 mg/dL — ABNORMAL LOW (ref 8.4–10.5)
Chloride: 100 mEq/L (ref 96–112)
Creatinine, Ser: 0.88 mg/dL (ref 0.50–1.35)
GFR calc Af Amer: >90 mL/min (ref >90)
GFR calc non Af Amer: 84 mL/min — ABNORMAL LOW (ref >90)
Glucose, Bld: 127 mg/dL — ABNORMAL HIGH (ref 70–99)
Potassium: 3.5 mEq/L (ref 3.5–5.1)
Sodium: 137 mEq/L (ref 135–145)

## 2013-02-02 LAB — CBC
HCT: 31.5 % — ABNORMAL LOW (ref 39.0–52.0)
Hemoglobin: 10.4 g/dL — ABNORMAL LOW (ref 13.0–17.0)
MCH: 28.7 pg (ref 26.0–34.0)
MCHC: 33 g/dL (ref 30.0–36.0)
MCV: 87 fL (ref 78.0–100.0)
Platelets: 158 10*3/uL (ref 150–400)
RBC: 3.62 MIL/uL — ABNORMAL LOW (ref 4.22–5.81)
RDW: 14.6 % (ref 11.5–15.5)
WBC: 7.7 10*3/uL (ref 4.0–10.5)

## 2013-02-02 MED ORDER — SODIUM CHLORIDE 0.9 % IJ SOLN: 3.0000 mL | INTRAMUSCULAR | Status: DC | PRN

## 2013-02-02 MED ORDER — RAMIPRIL 2.5 MG PO CAPS
2.5000 mg | ORAL_CAPSULE | Freq: Every day | ORAL | Status: DC
  Administered 2013-02-02 – 2013-02-03 (×2): 2.5 mg via ORAL
  Filled 2013-02-02 (×3): qty 1

## 2013-02-02 MED ORDER — MOVING RIGHT ALONG BOOK
Freq: Once | Status: AC
  Administered 2013-02-02: 20:00:00
  Filled 2013-02-02: qty 1

## 2013-02-02 MED ORDER — ASPIRIN EC 81 MG PO TBEC
81.0000 mg | DELAYED_RELEASE_TABLET | Freq: Every day | ORAL | Status: DC
  Administered 2013-02-03 – 2013-02-06 (×4): 81 mg via ORAL
  Filled 2013-02-02 (×4): qty 1

## 2013-02-02 MED ORDER — METFORMIN HCL 500 MG PO TABS
500.0000 mg | ORAL_TABLET | Freq: Two times a day (BID) | ORAL | Status: DC
  Administered 2013-02-02 – 2013-02-03 (×3): 500 mg via ORAL
  Filled 2013-02-02 (×5): qty 1

## 2013-02-02 MED ORDER — SODIUM CHLORIDE 0.9 % IJ SOLN
3.0000 mL | Freq: Two times a day (BID) | INTRAMUSCULAR | Status: DC
  Administered 2013-02-02 – 2013-02-06 (×8): 3 mL via INTRAVENOUS

## 2013-02-02 MED ORDER — ACETAMINOPHEN 325 MG PO TABS
650.0000 mg | ORAL_TABLET | Freq: Four times a day (QID) | ORAL | Status: DC | PRN
  Administered 2013-02-03 (×2): 650 mg via ORAL
  Filled 2013-02-02 (×2): qty 2

## 2013-02-02 MED ORDER — TRAMADOL HCL 50 MG PO TABS
50.0000 mg | ORAL_TABLET | ORAL | Status: DC | PRN
  Administered 2013-02-04: 100 mg via ORAL
  Filled 2013-02-02: qty 2

## 2013-02-02 MED ORDER — CLOPIDOGREL BISULFATE 75 MG PO TABS
75.0000 mg | ORAL_TABLET | Freq: Every day | ORAL | Status: DC
  Administered 2013-02-03 – 2013-02-06 (×4): 75 mg via ORAL
  Filled 2013-02-02 (×6): qty 1

## 2013-02-02 MED ORDER — LEVALBUTEROL TARTRATE 45 MCG/ACT IN AERO
2.0000 | INHALATION_SPRAY | Freq: Four times a day (QID) | RESPIRATORY_TRACT | Status: DC | PRN
  Filled 2013-02-02: qty 15

## 2013-02-02 MED ORDER — FUROSEMIDE 40 MG PO TABS
40.0000 mg | ORAL_TABLET | Freq: Every day | ORAL | Status: DC
  Administered 2013-02-02 – 2013-02-03 (×2): 40 mg via ORAL
  Filled 2013-02-02 (×2): qty 1

## 2013-02-02 MED ORDER — SODIUM CHLORIDE 0.9 % IV SOLN: 250.0000 mL | INTRAVENOUS | Status: DC | PRN

## 2013-02-02 MED ORDER — GLIMEPIRIDE 1 MG PO TABS
1.0000 mg | ORAL_TABLET | Freq: Every day | ORAL | Status: DC
  Administered 2013-02-02 – 2013-02-03 (×2): 1 mg via ORAL
  Filled 2013-02-02 (×3): qty 1

## 2013-02-02 MED ORDER — INSULIN ASPART 100 UNIT/ML ~~LOC~~ SOLN
0.0000 [IU] | Freq: Three times a day (TID) | SUBCUTANEOUS | Status: DC
  Administered 2013-02-03: 2 [IU] via SUBCUTANEOUS
  Administered 2013-02-04: 4 [IU] via SUBCUTANEOUS
  Administered 2013-02-04 – 2013-02-05 (×3): 2 [IU] via SUBCUTANEOUS
  Administered 2013-02-05: 4 [IU] via SUBCUTANEOUS

## 2013-02-02 MED ORDER — POTASSIUM CHLORIDE 10 MEQ/50ML IV SOLN
10.0000 meq | INTRAVENOUS | Status: AC | PRN
  Administered 2013-02-02 (×3): 10 meq via INTRAVENOUS
  Filled 2013-02-02: qty 50

## 2013-02-02 MED ORDER — MAGNESIUM HYDROXIDE 400 MG/5ML PO SUSP: 30.0000 mL | Freq: Every day | ORAL | Status: DC | PRN

## 2013-02-02 MED ORDER — POTASSIUM CHLORIDE CRYS ER 20 MEQ PO TBCR
20.0000 meq | EXTENDED_RELEASE_TABLET | Freq: Every day | ORAL | Status: DC
  Administered 2013-02-02 – 2013-02-06 (×5): 20 meq via ORAL
  Filled 2013-02-02 (×6): qty 1

## 2013-02-02 MED FILL — Sodium Bicarbonate IV Soln 8.4%: INTRAVENOUS | Qty: 50 | Status: AC

## 2013-02-02 MED FILL — Sodium Chloride Irrigation Soln 0.9%: Qty: 3000 | Status: AC

## 2013-02-02 MED FILL — Heparin Sodium (Porcine) Inj 1000 Unit/ML: INTRAMUSCULAR | Qty: 30 | Status: AC

## 2013-02-02 MED FILL — Heparin Sodium (Porcine) Inj 1000 Unit/ML: INTRAMUSCULAR | Qty: 10 | Status: AC

## 2013-02-02 MED FILL — Sodium Chloride IV Soln 0.9%: INTRAVENOUS | Qty: 1000 | Status: AC

## 2013-02-02 MED FILL — Mannitol IV Soln 20%: INTRAVENOUS | Qty: 500 | Status: AC

## 2013-02-02 MED FILL — Sodium Chloride IV Soln 0.9%: INTRAVENOUS | Qty: 2000 | Status: AC

## 2013-02-02 MED FILL — Lidocaine HCl IV Inj 20 MG/ML: INTRAVENOUS | Qty: 5 | Status: AC

## 2013-02-02 MED FILL — Electrolyte-R (PH 7.4) Solution: INTRAVENOUS | Qty: 4000 | Status: AC

## 2013-02-02 MED FILL — Potassium Chloride Inj 2 mEq/ML: INTRAVENOUS | Qty: 40 | Status: AC

## 2013-02-02 MED FILL — Magnesium Sulfate Inj 50%: INTRAMUSCULAR | Qty: 10 | Status: AC

## 2013-02-02 MED FILL — Dexmedetomidine HCl IV Soln 200 MCG/2ML: INTRAVENOUS | Qty: 2 | Status: AC

## 2013-02-02 NOTE — Progress Notes (Addendum)
TCTS DAILY ICU PROGRESS NOTE                   [address]            [place] [address]          [phone]   3 Days Post-Op Procedure(s) (LRB): CORONARY ARTERY BYPASS GRAFTING (CABG) (N/A) INTRAOPERATIVE TRANSESOPHAGEAL ECHOCARDIOGRAM (N/A)  Total Length of Stay:  LOS: 8 days   Subjective: Just walked around unit. Felt a little weak at first, but overall did well. +productive cough, no other complaints. Passing flatus, but no BM yet.   Objective: Vital signs in last 24 hours: Temp:  [97.7 F (36.5 C)-99.1 F (37.3 C)] 98.3 F (36.8 C) (11/24 0712) Pulse Rate:  [76-94] 85 (11/24 0700) Cardiac Rhythm:  [-] Normal sinus rhythm (11/24 0700) Resp:  [15-31] 21 (11/24 0700) BP: (102-168)/(54-81) 104/74 mmHg (11/24 0700) SpO2:  [93 %-97 %] 96 % (11/24 0700) Weight:  [245 lb 4.8 oz (111.267 kg)] 245 lb 4.8 oz (111.267 kg) (11/24 0400)  Filed Weights   01/31/13 0500 02/01/13 0500 02/02/13 0400  Weight: 251 lb 12.3 oz (114.2 kg) 249 lb 1.9 oz (113 kg) 245 lb 4.8 oz (111.267 kg)  PRE-OPERATIVE WEIGHT: 109 kg   Weight change: -3 lb 13.1 oz (-1.733 kg)      Intake/Output from previous day: 11/23 0701 - 11/24 0700 In: 2107.5 [P.O.:360; I.V.:1597.5; IV Piggyback:150] Out: 2385 [Urine:2385]  CBGs 114-154-127-137-129    Current Meds: Scheduled Meds: . acetaminophen  1,000 mg Oral Q6H   Or  . acetaminophen (TYLENOL) oral liquid 160 mg/5 mL  1,000 mg Per Tube Q6H  . aspirin EC  325 mg Oral Daily   Or  . aspirin  324 mg Per Tube Daily  . bisacodyl  10 mg Oral Daily   Or  . bisacodyl  10 mg Rectal Daily  . budesonide-formoterol  2 puff Inhalation BID  . docusate sodium  200 mg Oral Daily  . enoxaparin (LOVENOX) injection  30 mg Subcutaneous Q24H  . insulin aspart  0-24 Units Subcutaneous Q4H  . insulin detemir  18 Units Subcutaneous Daily  . levalbuterol  2 puff Inhalation Q6H  . metoprolol tartrate  12.5 mg Oral BID   Or  . metoprolol tartrate  12.5  mg Per Tube BID  . pantoprazole  40 mg Oral Daily  . simvastatin  40 mg Oral Daily  . sodium chloride  3 mL Intravenous Q12H   Continuous Infusions: . sodium chloride Stopped (02/01/13 0900)  . sodium chloride Stopped (02/01/13 0900)  . sodium chloride    . lactated ringers 20 mL/hr at 02/01/13 1600  . nitroGLYCERIN Stopped (02/01/13 0800)  . phenylephrine (NEO-SYNEPHRINE) Adult infusion Stopped (01/31/13 0000)   PRN Meds:.metoprolol, morphine injection, ondansetron (ZOFRAN) IV, oxyCODONE, potassium chloride, sodium chloride   Physical Exam: General appearance: alert, cooperative and no distress Heart: regular rate and rhythm Lungs: clear to auscultation bilaterally Abdomen: Soft, mildly distended, +BS, nontender Extremities: no edema Wound: Clean and dry    Lab Results: CBC: Recent Labs  02/01/13 0410 02/02/13 0400  WBC 8.2 7.7  HGB 10.0* 10.4*  HCT 28.6* 31.5*  PLT 135* 158   BMET:  Recent Labs  02/01/13 0410 02/02/13 0400  NA 137 137  K 4.2 3.5  CL 101 100  CO2 25 26  GLUCOSE 147* 127*  BUN 11 13  CREATININE 0.98 0.88  CALCIUM 8.1* 8.1*    PT/INR:  Recent  Labs  01/30/13 1400  LABPROT 16.3*  INR 1.34   Radiology: Dg Chest Port 1 View  02/02/2013   CLINICAL DATA:  Status post coronary artery bypass graft.  EXAM: PORTABLE CHEST - 1 VIEW  COMPARISON:  Multiple recent previous exams.  FINDINGS: 0613 hr. The cardio pericardial silhouette is enlarged. Persistent left base atelectasis with left pleural effusion. Subsegmental atelectasis again noted at the right base. No overt airspace pulmonary edema. Right IJ sheath remains in place. Telemetry leads overlie the chest.  IMPRESSION: No substantial change. Cardiomegaly with left base atelectasis and effusion.   Electronically Signed   By: Kennith Center M.D.   On: 02/02/2013 07:46   Dg Chest Port 1 View  02/01/2013   CLINICAL DATA:  Status post coronary artery bypass graft.  EXAM: PORTABLE CHEST - 1 VIEW   COMPARISON:  January 31, 2013.  FINDINGS: Stable cardiomegaly. Sternotomy wires are noted. Right lung is clear. Right internal jugular Swan-Ganz catheter has been removed and sheath remains in place. Left-sided chest tube has been removed without evidence of pneumothorax. Contusion remains and left midlung region most likely related to previous chest tube. Left basilar opacity is again noted and unchanged.  IMPRESSION: No pneumothorax noted status post left-sided chest tube removal. Left basilar opacity remains concerning for atelectasis and possible associated pleural effusion.   Electronically Signed   By: Roque Lias M.D.   On: 02/01/2013 07:20   Dg Abd Portable 1v  02/01/2013   CLINICAL DATA:  Possible bowel obstruction.  EXAM: PORTABLE ABDOMEN - 1 VIEW  COMPARISON:  None.  FINDINGS: Stool is noted in the colon. No significant bowel dilatation is noted. Severe degenerative change of left hip joint is noted. No abnormal calcifications are noted.  IMPRESSION: No evidence of bowel obstruction or ileus.   Electronically Signed   By: Roque Lias M.D.   On: 02/01/2013 13:00     Assessment/Plan: S/P Procedure(s) (LRB): CORONARY ARTERY BYPASS GRAFTING (CABG) (N/A) INTRAOPERATIVE TRANSESOPHAGEAL ECHOCARDIOGRAM (N/A)  CV- Maintaining SR.  BPs starting to trend up.  Continue Lopressor.  May need to increase dose if BPs continue to increase.  Add ACE-I when able.  Vol overload- diurese.  DM- CBGs generally controlled on Levemir.  Will start to resume po meds and wean Levemir, but ultimately may need to insulin at home (A1C=10.7).  Will monitor closely.  Mild ileus- passing flatus, KUB stable.  Clinically resolving.    Expected postop blood loss anemia- H/H stable.  Continue CRPI, pulm toilet. Hopefully can tx to stepdown today.  COLLINS,GINA H 02/02/2013 7:59 AM \ Will place patient back on Plavix for the stent he has in the proximal circumflex  Otherwise patient is progressing. Follow left  pleural effusion on chest x-rays.

## 2013-02-02 NOTE — Progress Notes (Signed)
Placed patient on CPAP via auto-mode with minimum pressure set at 6cm and maximum pressure set at 20cm. Oxygen set at 2lpm with Sp02=95%

## 2013-02-02 NOTE — Progress Notes (Addendum)
Subjective: No complaints. Ambulating w/ difficulty. Still hasn't had a BM.   Objective: Vital signs in last 24 hours: Temp:  [97.7 F (36.5 C)-99.1 F (37.3 C)] 98.3 F (36.8 C) (11/24 0712) Pulse Rate:  [76-97] 97 (11/24 0800) Resp:  [15-31] 26 (11/24 0800) BP: (102-168)/(54-89) 113/89 mmHg (11/24 0800) SpO2:  [93 %-97 %] 96 % (11/24 0820) Weight:  [245 lb 4.8 oz (111.267 kg)] 245 lb 4.8 oz (111.267 kg) (11/24 0400) Last BM Date: 01/25/13  Intake/Output from previous day: 11/23 0701 - 11/24 0700 In: 2107.5 [P.O.:360; I.V.:1597.5; IV Piggyback:150] Out: 2385 [Urine:2385] Intake/Output this shift: Total I/O In: 150 [P.O.:100; IV Piggyback:50] Out: -   Medications Current Facility-Administered Medications  Medication Dose Route Frequency Provider Last Rate Last Dose  . 0.45 % sodium chloride infusion   Intravenous Continuous Wilmon Pali, PA-C   20 mL/hr at 01/30/13 1400  . 0.9 %  sodium chloride infusion   Intravenous Continuous Gina L Collins, PA-C      . 0.9 %  sodium chloride infusion  250 mL Intravenous Continuous Gina L Collins, PA-C      . acetaminophen (TYLENOL) tablet 1,000 mg  1,000 mg Oral Q6H Wilmon Pali, PA-C   1,000 mg at 02/02/13 4540   Or  . acetaminophen (TYLENOL) solution 1,000 mg  1,000 mg Per Tube Q6H Wilmon Pali, PA-C   1,000 mg at 01/31/13 0006  . aspirin EC tablet 325 mg  325 mg Oral Daily Wilmon Pali, PA-C   325 mg at 02/02/13 9811   Or  . aspirin chewable tablet 324 mg  324 mg Per Tube Daily Gina L Collins, PA-C      . bisacodyl (DULCOLAX) EC tablet 10 mg  10 mg Oral Daily Wilmon Pali, PA-C   10 mg at 02/02/13 9147   Or  . bisacodyl (DULCOLAX) suppository 10 mg  10 mg Rectal Daily Wilmon Pali, PA-C      . budesonide-formoterol (SYMBICORT) 160-4.5 MCG/ACT inhaler 2 puff  2 puff Inhalation BID Kerin Perna, MD   2 puff at 02/02/13 0818  . docusate sodium (COLACE) capsule 200 mg  200 mg Oral Daily Wilmon Pali, PA-C   200 mg at  02/02/13 8295  . enoxaparin (LOVENOX) injection 30 mg  30 mg Subcutaneous Q24H Delight Ovens, MD   30 mg at 02/02/13 0936  . glimepiride (AMARYL) tablet 1 mg  1 mg Oral Q breakfast Gina L Collins, PA-C      . insulin aspart (novoLOG) injection 0-24 Units  0-24 Units Subcutaneous Q4H Delight Ovens, MD   2 Units at 02/02/13 [phone]  . insulin detemir (LEVEMIR) injection 18 Units  18 Units Subcutaneous Daily Delight Ovens, MD   18 Units at 02/02/13 [phone]  . lactated ringers infusion   Intravenous Continuous Wilmon Pali, PA-C 20 mL/hr at 02/01/13 1600    . levalbuterol [facility] HFA) inhaler 2 puff  2 puff Inhalation Q6H PRN Kerin Perna, MD      . metFORMIN (GLUCOPHAGE) tablet 500 mg  500 mg Oral BID WC Gina L Collins, PA-C      . metoprolol (LOPRESSOR) injection 2.5-5 mg  2.5-5 mg Intravenous Q2H PRN Wilmon Pali, PA-C   2.5 mg at 01/31/13 0510  . metoprolol tartrate (LOPRESSOR) tablet 12.5 mg  12.5 mg Oral BID Wilmon Pali, PA-C   12.5 mg at 02/02/13 7846   Or  . metoprolol tartrate (LOPRESSOR)  25 mg/10 mL oral suspension 12.5 mg  12.5 mg Per Tube BID Wilmon Pali, PA-C      . morphine 2 MG/ML injection 2-5 mg  2-5 mg Intravenous Q1H PRN Wilmon Pali, PA-C   2 mg at 02/01/13 0630  . nitroGLYCERIN 0.2 mg/mL in dextrose 5 % infusion  0-100 mcg/min Intravenous Continuous Wilmon Pali, PA-C   16.667 mcg/min at 02/01/13 0730  . ondansetron (ZOFRAN) injection 4 mg  4 mg Intravenous Q6H PRN Wilmon Pali, PA-C   4 mg at 02/02/13 0426  . oxyCODONE (Oxy IR/ROXICODONE) immediate release tablet 5-10 mg  5-10 mg Oral Q3H PRN Wilmon Pali, PA-C   5 mg at 02/02/13 0552  . pantoprazole (PROTONIX) EC tablet 40 mg  40 mg Oral Daily Wilmon Pali, PA-C   40 mg at 02/02/13 0936  . phenylephrine (NEO-SYNEPHRINE) 20 mg in dextrose 5 % 250 mL infusion  0-100 mcg/min Intravenous Continuous Wilmon Pali, PA-C   5 mcg/min at 01/30/13 2230  . simvastatin (ZOCOR) tablet 40 mg  40 mg Oral Daily  Wilburt Finlay, PA-C   40 mg at 02/02/13 0936  . sodium chloride 0.9 % injection 3 mL  3 mL Intravenous Q12H Wilmon Pali, PA-C   3 mL at 02/01/13 0956  . sodium chloride 0.9 % injection 3 mL  3 mL Intravenous PRN Wilmon Pali, PA-C        PE: General appearance: alert, cooperative and no distress Neck: no JVD Heart: regular rate and rhythm, S1, S2 normal, no murmur, click, rub or gallop Abdomen: distended, + BS Extremities: no LEE Pulses: 2+ and symmetric Skin: warm and dry Neurologic: Grossly normal  Lab Results:   Recent Labs  01/31/13 1650 02/01/13 0410 02/02/13 0400  WBC 8.5 8.2 7.7  HGB 10.7* 10.0* 10.4*  HCT 31.5* 28.6* 31.5*  PLT 156 135* 158   BMET  Recent Labs  01/31/13 0400 01/31/13 1648 01/31/13 1650 02/01/13 0410 02/02/13 0400  NA 140 138  --  137 137  K 4.0 4.0  --  4.2 3.5  CL 107 102  --  101 100  CO2 22  --   --  25 26  GLUCOSE 107* 141*  --  147* 127*  BUN 11 9  --  11 13  CREATININE 1.01 1.10 0.93 0.98 0.88  CALCIUM 7.8*  --   --  8.1* 8.1*   PT/INR  Recent Labs  01/30/13 1400  LABPROT 16.3*  INR 1.34     Assessment/Plan  Principal Problem:   NSTEMI (non-ST elevated myocardial infarction) Active Problems:   CAD S/P percutaneous coronary angioplasty - PCI  x 3 stents @ [facility]; CABG x 3 01/30/13    DM type 2 (diabetes mellitus, type 2)   HTN (hypertension), benign   HLD (hyperlipidemia)  Plan: Day 3 s/p CABG x 3. No CP. HR and BP both stable. NSR on telemetry. No post-op arrhthymias. Continue on BB. He will need a low dose ACE. EF 30-35%. Renal function stable. ? Starting ACE-I today vs tomorrow. SBP in the 110s. Continue ASA and statin.     LOS: 8 days    Brittainy M. Sharol Harness, PA-C 02/02/2013 10:26 AM  I have seen and examined the patient along with Brittainy M. Sharol Harness, PA-C.  I have reviewed the chart, notes and new data.  I agree with PA's note.  Key new complaints: still sore Key examination changes: mild left calf  edema, I/O fairly  matched Key new findings / data: creat 0.88, Hgb 10.4  PLAN: Start ramipril 2.5 mg today. Plan to titrate up quickly if BP allows  Thurmon Fair, MD, [facility] [phone] 02/02/2013, 3:24 PM

## 2013-02-02 NOTE — Plan of Care (Signed)
Problem: Food- and Nutrition-Related Knowledge Deficit (NB-1.1) Goal: Nutrition education Formal process to instruct or train a patient/client in a skill or to impart knowledge to help patients/clients voluntarily manage or modify food choices and eating behavior to maintain or improve health. Outcome: Completed/Met Date Met:  02/02/13  RD consulted for nutrition education regarding diabetes & hypertension.     Lab Results  Component Value Date    HGBA1C 10.5* 01/29/2013    RD provided "Carbohydrate Counting for People with Diabetes" and "Hypertension Nutrition Therapy" handouts from the Academy of [employer] and Dietetics. Discussed different food groups and their effects on blood sugar, emphasizing carbohydrate-containing foods. Provided list of carbohydrates and recommended serving sizes of common foods.  Discussed importance of controlled and consistent carbohydrate intake throughout the day. Provided examples of ways to balance meals/snacks and encouraged intake of high-fiber, whole grain complex carbohydrates.  Encouraged fresh fruits and vegetables and limitation of processed foods.   Expect fair compliance.  Body mass index is 39.61 kg/(m^2). Pt meets criteria for Obesity Class II based on current BMI.  Current diet order is Clear Liquids. Labs and medications reviewed.   No further nutrition interventions warranted at this time.  If additional nutrition issues arise, please re-consult RD.  Maureen Chatters, RD, LDN Pager #: [phone] After-Hours Pager #: [phone]

## 2013-02-02 NOTE — Progress Notes (Signed)
Inpatient Diabetes Program Recommendations  AACE/ADA: New Consensus Statement on Inpatient Glycemic Control (2013)  Target Ranges:  Prepandial:   less than 140 mg/dL      Peak postprandial:   less than 180 mg/dL (1-2 hours)      Critically ill patients:  140 - 180 mg/dL   Results for MARKCUS, LAZENBY (MRN [id number]) as of 02/02/2013 13:04  Ref. Range 02/01/2013 07:34 02/01/2013 11:36 02/01/2013 15:40 02/01/2013 19:36 02/01/2013 23:51 02/02/2013 04:10 02/02/2013 07:09 02/02/2013 11:19  Glucose-Capillary Latest Range: 70-99 mg/dL 413 (H) 244 (H) 010 (H) 154 (H) 127 (H) 137 (H) 129 (H) 168 (H)    Inpatient Diabetes Program Recommendations Insulin : MD - Please inform staff of discharge plan for diabetes management.  If patient is to be discharged on insulin he will need to be educated prior to discharge. Outpatient Referral: Patient would benefit from outpatient diabetes education.  Note:  Spoke with patient about diabetes and home regimen for diabetes control.   Patient reports that he was taking "4 pills a day for diabetes" but not sure of the names of the medications.  According to the medication list, patient was prescribed Metformin 500 mg BID and Amaryl 1 mg QAM as an outpatient prior to hospitalization.  Patient reports that he checks his blood sugar 2-3 times a day.  According to the patient his blood sugar generally runs 130-200's mg/dl.  Patient states that he takes his medications as prescribed but he admits to not eating and drinking the right things.  Patient states that he has never received any education from a dietitian about what foods he should and should not be eating.  Discussed A1C results (10.5%) on 01/29/13) and explained what an A1C is as patient reports that he has never been told anything about an A1C.  Discussed importance of checking CBGs and maintaining good CBG control to prevent long-term and short-term complications.   Discussed insulin and explained current inpatient  regimen for glycemic control.  Patient reports that he has never been on insulin in the past but he has a sister that takes insulin.  Discussed the potential that insulin may be prescribed at time of discharge to improve diabetes control especially given his recent surgery.  Patient states that he is willing to take insulin if he needs to.  Reviewed signs and symptoms of hyperglycemia and hypoglycemia along with treatment for both and discussed the increased risk of hypoglycemia with insulin use.  Discussed impact of nutrition, stress, and exercise on diabetes control.  As noted above, patient has not been following a diabetic diet and drinks regular sodas.  Discussed carbohydrates and how they are broken down and impact blood sugar.  Provided patient with handouts on Carb Counting and reviewed how to read food labels.  Have ordered RD consult for diet education.  Patient would also benefit from outpatient diabetes education as well.    RNs to provide ongoing basic DM education at bedside with this patient and engage patient to actively check blood glucose and administer insulin injections. Request MD to provide guidance on discharge plan for diabetes management to ensure patient is properly educated prior to discharge.  Provided a list of diabetes education videos and asked that he watch 502 (Preventing Long Term complications of Diabetes), 504 (Everyone Can Carbohydrate Count), and if he will discharge on insulin he will need to watch 506 (Diabetes: Preparing and Giving an Insulin injection), 508 (Diabetes: Reducing Fears about injections), and 511(How to use an insulin pen).  Reviewed all steps of insulin pen including attachment of needle, 2-unit air shot, dialing up dose, giving injection, removing needle, disposal of sharps, storage of unused insulin, disposal of insulin etc.  Patient verbalized understanding of information discussed and states that he has no questions related to diabetes at this  time.  Given the A1C, recommend that patient be discharge on insulin in addition to oral medications for diabetes management.  Currently, patient is ordered Levemir 18 units daily, Novolog 0-20 units Q4H, Metformin 500 mg BID, and Amaryl 1 mg QAM for inpatient glycemic control.  Noted Metformin and Amaryl were ordered to start today.  Blood glucose ranged from 114-188 mg/dl on 16/10 and has ranged from 127-169 mg/dl thus far today. Will continue to follow.        Thanks, Orlando Penner, RN, MSN, CCRN Diabetes Coordinator Inpatient Diabetes Program [phone] (Team Pager) [phone] ([facility] office) [phone] [facility] office)

## 2013-02-03 ENCOUNTER — Encounter (HOSPITAL_COMMUNITY): Payer: Self-pay | Admitting: Cardiothoracic Surgery

## 2013-02-03 ENCOUNTER — Inpatient Hospital Stay (HOSPITAL_COMMUNITY): Payer: 59

## 2013-02-03 LAB — GLUCOSE, CAPILLARY
Glucose-Capillary: 163 mg/dL — ABNORMAL HIGH (ref 70–99)
Glucose-Capillary: 119 mg/dL — ABNORMAL HIGH (ref 70–99)
Glucose-Capillary: 150 mg/dL — ABNORMAL HIGH (ref 70–99)

## 2013-02-03 LAB — CBC
HCT: 31.7 % — ABNORMAL LOW (ref 39.0–52.0)
Hemoglobin: 10.5 g/dL — ABNORMAL LOW (ref 13.0–17.0)
MCH: 28.8 pg (ref 26.0–34.0)
MCV: 86.8 fL (ref 78.0–100.0)
RBC: 3.65 MIL/uL — ABNORMAL LOW (ref 4.22–5.81)
WBC: 6.1 10*3/uL (ref 4.0–10.5)

## 2013-02-03 LAB — BASIC METABOLIC PANEL
BUN: 13 mg/dL (ref 6–23)
CO2: 24 mEq/L (ref 19–32)
Chloride: 99 mEq/L (ref 96–112)
Creatinine, Ser: 0.89 mg/dL (ref 0.50–1.35)
Glucose, Bld: 133 mg/dL — ABNORMAL HIGH (ref 70–99)
Potassium: 4.2 mEq/L (ref 3.5–5.1)

## 2013-02-03 MED ORDER — FUROSEMIDE 10 MG/ML IJ SOLN
40.0000 mg | Freq: Every day | INTRAMUSCULAR | Status: DC
  Administered 2013-02-04 – 2013-02-06 (×3): 40 mg via INTRAVENOUS
  Filled 2013-02-03 (×3): qty 4

## 2013-02-03 MED ORDER — METFORMIN HCL 500 MG PO TABS
1000.0000 mg | ORAL_TABLET | Freq: Two times a day (BID) | ORAL | Status: DC
  Administered 2013-02-03 – 2013-02-06 (×6): 1000 mg via ORAL
  Filled 2013-02-03 (×8): qty 2

## 2013-02-03 MED ORDER — METOPROLOL TARTRATE 25 MG PO TABS
25.0000 mg | ORAL_TABLET | Freq: Two times a day (BID) | ORAL | Status: DC
  Administered 2013-02-03 – 2013-02-04 (×4): 25 mg via ORAL
  Filled 2013-02-03 (×6): qty 1

## 2013-02-03 MED ORDER — GLIMEPIRIDE 2 MG PO TABS
2.0000 mg | ORAL_TABLET | Freq: Every day | ORAL | Status: DC
  Administered 2013-02-04 – 2013-02-06 (×3): 2 mg via ORAL
  Filled 2013-02-03 (×4): qty 1

## 2013-02-03 MED ORDER — INSULIN DETEMIR 100 UNIT/ML ~~LOC~~ SOLN
15.0000 [IU] | Freq: Every day | SUBCUTANEOUS | Status: DC
  Administered 2013-02-03: 15 [IU] via SUBCUTANEOUS
  Filled 2013-02-03 (×2): qty 0.15

## 2013-02-03 MED ORDER — MENTHOL 3 MG MT LOZG
1.0000 | LOZENGE | OROMUCOSAL | Status: DC | PRN
  Filled 2013-02-03: qty 9

## 2013-02-03 MED ORDER — FUROSEMIDE 10 MG/ML IJ SOLN
40.0000 mg | Freq: Once | INTRAMUSCULAR | Status: AC
  Administered 2013-02-03: 40 mg via INTRAVENOUS
  Filled 2013-02-03: qty 4

## 2013-02-03 MED ORDER — LACTULOSE 10 GM/15ML PO SOLN
30.0000 g | Freq: Every day | ORAL | Status: AC
  Administered 2013-02-03: 30 g via ORAL
  Filled 2013-02-03: qty 45

## 2013-02-03 MED ORDER — GUAIFENESIN ER 600 MG PO TB12
600.0000 mg | ORAL_TABLET | Freq: Two times a day (BID) | ORAL | Status: DC
  Administered 2013-02-03 – 2013-02-06 (×7): 600 mg via ORAL
  Filled 2013-02-03 (×8): qty 1

## 2013-02-03 NOTE — Progress Notes (Signed)
CARDIAC REHAB PHASE I   PRE:  Rate/Rhythm: 91 SR  BP:  Supine:   Sitting: 142/80  Standing:    SaO2: 93 RA  MODE:  Ambulation: 350 ft   POST:  Rate/Rhythm: 93 SR  BP:  Supine:   Sitting: 154/74  Standing:    SaO2: 94 RA 1120-1200 On arrival pt in recliner room air sat 93%. Assisted X 1 and use walker to ambulate. Gait steady with walker. Pt tires easily and is DOE. Room air sat in hall on walk 95%. Pt back to recliner after walk with call light in reach. Encouraged use of IS and two more walks today.  Melina Copa RN 02/03/2013 11:57 AM

## 2013-02-03 NOTE — Progress Notes (Addendum)
[address]       [place] [address]             [phone]          4 Days Post-Op Procedure(s) (LRB): CORONARY ARTERY BYPASS GRAFTING (CABG) (N/A) INTRAOPERATIVE TRANSESOPHAGEAL ECHOCARDIOGRAM (N/A)  Subjective: Doing well.  Still with productive cough, thick clear sputum. Eating better, but no BM yet.  Passing flatus.    Objective: Vital signs in last 24 hours: Patient Vitals for the past 24 hrs:  BP Temp Temp src Pulse Resp SpO2 Weight  02/03/13 0806 - - - - - 97 % -  02/03/13 0534 135/71 mmHg 97.8 F (36.6 C) Oral 78 18 98 % 243 lb 6.2 oz (110.4 kg)  02/02/13 2253 - - - 91 18 94 % -  02/02/13 2040 - - - - - 96 % -  02/02/13 1902 156/75 mmHg 97.6 F (36.4 C) Oral 88 16 95 % -  02/02/13 1800 129/96 mmHg - - 82 17 96 % -  02/02/13 1728 129/96 mmHg - - - - - -  02/02/13 1700 109/56 mmHg - - 82 21 93 % -  02/02/13 1600 126/78 mmHg - - 85 23 96 % -  02/02/13 1509 - 98.4 F (36.9 C) Oral - - - -  02/02/13 1500 133/84 mmHg - - 92 25 95 % -  02/02/13 1400 145/96 mmHg - - 81 18 96 % -  02/02/13 1300 140/69 mmHg - - - 24 97 % -  02/02/13 1200 125/76 mmHg - - - 26 95 % -  02/02/13 1121 - 97.7 F (36.5 C) Oral - - - -  02/02/13 1100 139/84 mmHg - - - - - -   Current Weight  02/03/13 243 lb 6.2 oz (110.4 kg)  PRE-OPERATIVE WEIGHT: 109 kg    Intake/Output from previous day: 11/24 0701 - 11/25 0700 In: 506 [P.O.:450; I.V.:6; IV Piggyback:50] Out: 1050 [Urine:1050]  CBGs [phone]    PHYSICAL EXAM:  Heart: RRR Lungs: Clear, slightly diminished BS in bases Wound: Clean and dry Abdomen: soft, nontender, less distended, +BS Extremities: Mild LE edema    Lab Results: CBC: Recent Labs  02/02/13 0400 02/03/13 0603  WBC 7.7 6.1  HGB 10.4* 10.5*  HCT 31.5* 31.7*  PLT 158 188   BMET:  Recent Labs  02/02/13 0400 02/03/13 0603  NA 137 138  K 3.5 4.2  CL 100 99  CO2 26 24  GLUCOSE 127* 133*  BUN 13 13  CREATININE  0.88 0.89  CALCIUM 8.1* 8.2*    PT/INR: No results found for this basename: LABPROT, INR,  in the last 72 hours  CXR: FINDINGS:  The right jugular sheath is been removed in the interval. The  cardiac shadow remains enlarged. Postsurgical changes are again  seen. Small amount of pleural fluid is again noted on the right with  thickening of the minor fissure and blunting of the costophrenic  angle. Persistent left basilar changes are noted. No pneumothorax is  seen.  IMPRESSION:  Slight increase in the degree of right-sided pleural effusion.  The remainder of the exam is stable from the prior study.   Assessment/Plan: S/P Procedure(s) (LRB): CORONARY ARTERY BYPASS GRAFTING (CABG) (N/A) INTRAOPERATIVE TRANSESOPHAGEAL ECHOCARDIOGRAM (N/A) CV- Maintaining SR. BPs trending up.  Will increase Lopressor to 25 mg bid and watch. Continue ACE-I, titrate as needed. Plavix/ASA for prior stent. Vol overload- Continue diuresis. Follow right effusion. Pulm - will  add Mucinex for sputum. Continue CPAP at hs. DM- CBGs generally controlled.  A1C=10.7.  He is currently on low dose po meds, as well as Levemir. Will increase doses of Metformin and Amaryl and try to decrease Levemir.  The patient is willing to go home on insulin if needed, but since he has not been on maximum po therapy, it seems reasonable to try this first.  GI- passing flatus, still no BM.  Abdomen less distended and tolerating diet. Will give LOC today and watch. Expected postop blood loss anemia- H/H stable.  Continue CRPI, pulm toilet, wean O2.   LOS: 9 days    COLLINS,GINA H 02/03/2013  patient examined and medical record reviewed,agree with above note Wean O2 and follow effusion/ CXR- cont lasix. VAN TRIGT III,Luella Gardenhire 02/03/2013

## 2013-02-03 NOTE — Progress Notes (Signed)
Subjective: Feeling pretty good. Only complaint is chest wall soreness when he coughs. Little difficulty with cardiac rehab.   Objective: Vital signs in last 24 hours: Temp:  [97.6 F (36.4 C)-98.4 F (36.9 C)] 97.8 F (36.6 C) (11/25 0534) Pulse Rate:  [78-92] 78 (11/25 0534) Resp:  [16-26] 18 (11/25 0534) BP: (109-156)/(56-96) 135/71 mmHg (11/25 0534) SpO2:  [93 %-98 %] 97 % (11/25 0806) Weight:  [243 lb 6.2 oz (110.4 kg)] 243 lb 6.2 oz (110.4 kg) (11/25 0534) Last BM Date:  (pre-op)  Intake/Output from previous day: 11/24 0701 - 11/25 0700 In: 506 [P.O.:450; I.V.:6; IV Piggyback:50] Out: 1050 [Urine:1050] Intake/Output this shift: Total I/O In: 240 [P.O.:240] Out: -   Medications Current Facility-Administered Medications  Medication Dose Route Frequency Provider Last Rate Last Dose  . 0.9 %  sodium chloride infusion  250 mL Intravenous PRN Wilmon Pali, PA-C      . acetaminophen (TYLENOL) tablet 650 mg  650 mg Oral Q6H PRN Wilmon Pali, PA-C   650 mg at 02/03/13 0536  . aspirin EC tablet 81 mg  81 mg Oral Daily Kerin Perna, MD   81 mg at 02/03/13 1059  . bisacodyl (DULCOLAX) EC tablet 10 mg  10 mg Oral Daily Wilmon Pali, PA-C   10 mg at 02/02/13 1610   Or  . bisacodyl (DULCOLAX) suppository 10 mg  10 mg Rectal Daily Wilmon Pali, PA-C      . budesonide-formoterol (SYMBICORT) 160-4.5 MCG/ACT inhaler 2 puff  2 puff Inhalation BID Kerin Perna, MD   2 puff at 02/03/13 0805  . clopidogrel (PLAVIX) tablet 75 mg  75 mg Oral Q breakfast Kerin Perna, MD   75 mg at 02/03/13 0830  . docusate sodium (COLACE) capsule 200 mg  200 mg Oral Daily Wilmon Pali, PA-C   200 mg at 02/03/13 1059  . enoxaparin (LOVENOX) injection 30 mg  30 mg Subcutaneous Q24H Delight Ovens, MD   30 mg at 02/03/13 1000  . furosemide (LASIX) tablet 40 mg  40 mg Oral Daily Wilmon Pali, PA-C   40 mg at 02/03/13 1059  . [START ON 02/04/2013] glimepiride (AMARYL) tablet 2 mg  2 mg Oral  Q breakfast Gina L Collins, PA-C      . guaiFENesin (MUCINEX) 12 hr tablet 600 mg  600 mg Oral BID Wilmon Pali, PA-C   600 mg at 02/03/13 1059  . insulin aspart (novoLOG) injection 0-24 Units  0-24 Units Subcutaneous TID AC & HS Wilmon Pali, PA-C   2 Units at 02/03/13 0701  . insulin detemir (LEVEMIR) injection 15 Units  15 Units Subcutaneous Daily Wilmon Pali, PA-C   15 Units at 02/03/13 1059  . levalbuterol [facility] HFA) inhaler 2 puff  2 puff Inhalation Q6H PRN Kerin Perna, MD      . magnesium hydroxide (MILK OF MAGNESIA) suspension 30 mL  30 mL Oral Daily PRN Wilmon Pali, PA-C      . menthol-cetylpyridinium (CEPACOL) lozenge 3 mg  1 lozenge Oral PRN Wilmon Pali, PA-C      . metFORMIN (GLUCOPHAGE) tablet 1,000 mg  1,000 mg Oral BID WC Gina L Collins, PA-C      . metoprolol (LOPRESSOR) injection 2.5-5 mg  2.5-5 mg Intravenous Q2H PRN Wilmon Pali, PA-C   2.5 mg at 01/31/13 0510  . metoprolol tartrate (LOPRESSOR) tablet 25 mg  25 mg Oral BID Wilmon Pali, PA-C  25 mg at 02/03/13 1059  . ondansetron (ZOFRAN) injection 4 mg  4 mg Intravenous Q6H PRN Wilmon Pali, PA-C   4 mg at 02/02/13 0426  . oxyCODONE (Oxy IR/ROXICODONE) immediate release tablet 5-10 mg  5-10 mg Oral Q3H PRN Wilmon Pali, PA-C   5 mg at 02/02/13 0552  . pantoprazole (PROTONIX) EC tablet 40 mg  40 mg Oral Daily Wilmon Pali, PA-C   40 mg at 02/03/13 1059  . potassium chloride SA (K-DUR,KLOR-CON) CR tablet 20 mEq  20 mEq Oral Daily Wilmon Pali, PA-C   20 mEq at 02/03/13 1059  . ramipril (ALTACE) capsule 2.5 mg  2.5 mg Oral Daily Mihai Croitoru, MD   2.5 mg at 02/03/13 1059  . simvastatin (ZOCOR) tablet 40 mg  40 mg Oral Daily Wilburt Finlay, PA-C   40 mg at 02/03/13 1059  . sodium chloride 0.9 % injection 3 mL  3 mL Intravenous Q12H Gina L Collins, PA-C   3 mL at 02/03/13 1000  . sodium chloride 0.9 % injection 3 mL  3 mL Intravenous PRN Wilmon Pali, PA-C      . traMADol (ULTRAM) tablet 50-100 mg   50-100 mg Oral Q4H PRN Wilmon Pali, PA-C        PE: General appearance: alert, cooperative and no distress Lungs: clear to auscultation bilaterally Heart: regular rate and rhythm Extremities: no LEE Pulses: 2+ and symmetric Skin: warm and dry Neurologic: Grossly normal  Lab Results:   Recent Labs  02/01/13 0410 02/02/13 0400 02/03/13 0603  WBC 8.2 7.7 6.1  HGB 10.0* 10.4* 10.5*  HCT 28.6* 31.5* 31.7*  PLT 135* 158 188   BMET  Recent Labs  02/01/13 0410 02/02/13 0400 02/03/13 0603  NA 137 137 138  K 4.2 3.5 4.2  CL 101 100 99  CO2 25 26 24   GLUCOSE 147* 127* 133*  BUN 11 13 13   CREATININE 0.98 0.88 0.89  CALCIUM 8.1* 8.1* 8.2*     Assessment/Plan    Principal Problem:   NSTEMI (non-ST elevated myocardial infarction) Active Problems:   CAD S/P percutaneous coronary angioplasty - PCI  x 3 stents @ [facility]; CABG x 3 01/30/13    DM type 2 (diabetes mellitus, type 2)   HTN (hypertension), benign   HLD (hyperlipidemia)  Plan: day 4 s/p CABG. Now on telemetry floor. Vitals stable. SBP in the low 140s. HR in the 80s. NSR on telemetry w/o arrhthymias. Low dose ramipril was started yesterday at 2.5 mg. Can possibly increase dose today. Renal function is stable. Will also continue on BB, Lasix, ASA and statin. Will continue to follow along.    LOS: 9 days   Brittainy M. Sharol Harness, PA-C 02/03/2013 11:31 AM  I have seen & examined the patient this AM along with Ms. Simmons.  I agree with findings, exam & recommendations.  Looks well POD#4 s/p CABG.  Off O2 today with CRH.   On BB - dose increased, along with low dose ACE-I.  ON PO diuretic for post-op volume overload.    No arrhythmias.    Anticipate d/c in ~1-2 days.  Marykay Lex, MD

## 2013-02-04 ENCOUNTER — Inpatient Hospital Stay (HOSPITAL_COMMUNITY): Payer: 59

## 2013-02-04 DIAGNOSIS — Z951 Presence of aortocoronary bypass graft: Secondary | ICD-10-CM

## 2013-02-04 LAB — CBC
HCT: 32.3 % — ABNORMAL LOW (ref 39.0–52.0)
Hemoglobin: 10.6 g/dL — ABNORMAL LOW (ref 13.0–17.0)
MCH: 28.5 pg (ref 26.0–34.0)
MCHC: 32.8 g/dL (ref 30.0–36.0)
MCV: 86.8 fL (ref 78.0–100.0)
Platelets: 209 10*3/uL (ref 150–400)
RBC: 3.72 MIL/uL — ABNORMAL LOW (ref 4.22–5.81)
RDW: 14.5 % (ref 11.5–15.5)
WBC: 6.7 10*3/uL (ref 4.0–10.5)

## 2013-02-04 LAB — COMPREHENSIVE METABOLIC PANEL
ALT: 25 U/L (ref 0–53)
AST: 23 U/L (ref 0–37)
Albumin: 2.7 g/dL — ABNORMAL LOW (ref 3.5–5.2)
Alkaline Phosphatase: 96 U/L (ref 39–117)
BUN: 15 mg/dL (ref 6–23)
CO2: 27 mEq/L (ref 19–32)
Calcium: 8.3 mg/dL — ABNORMAL LOW (ref 8.4–10.5)
Chloride: 98 mEq/L (ref 96–112)
Creatinine, Ser: 1.02 mg/dL (ref 0.50–1.35)
GFR calc Af Amer: 83 mL/min — ABNORMAL LOW (ref >90)
GFR calc non Af Amer: 72 mL/min — ABNORMAL LOW (ref >90)
Glucose, Bld: 153 mg/dL — ABNORMAL HIGH (ref 70–99)
Potassium: 4.4 mEq/L (ref 3.5–5.1)
Sodium: 137 mEq/L (ref 135–145)
Total Bilirubin: 0.5 mg/dL (ref 0.3–1.2)
Total Protein: 6.8 g/dL (ref 6.0–8.3)

## 2013-02-04 LAB — GLUCOSE, CAPILLARY
Glucose-Capillary: 129 mg/dL — ABNORMAL HIGH (ref 70–99)
Glucose-Capillary: 168 mg/dL — ABNORMAL HIGH (ref 70–99)
Glucose-Capillary: 84 mg/dL (ref 70–99)
Glucose-Capillary: 134 mg/dL — ABNORMAL HIGH (ref 70–99)

## 2013-02-04 MED ORDER — INSULIN DETEMIR 100 UNIT/ML ~~LOC~~ SOLN
10.0000 [IU] | Freq: Every day | SUBCUTANEOUS | Status: DC
  Administered 2013-02-04: 10 [IU] via SUBCUTANEOUS
  Filled 2013-02-04 (×2): qty 0.1

## 2013-02-04 MED ORDER — LACTULOSE 10 GM/15ML PO SOLN
30.0000 g | Freq: Every day | ORAL | Status: DC | PRN
  Administered 2013-02-05: 30 g via ORAL
  Filled 2013-02-04 (×2): qty 45

## 2013-02-04 MED ORDER — RAMIPRIL 5 MG PO CAPS
5.0000 mg | ORAL_CAPSULE | Freq: Every day | ORAL | Status: DC
  Administered 2013-02-04 – 2013-02-06 (×3): 5 mg via ORAL
  Filled 2013-02-04 (×3): qty 1

## 2013-02-04 MED ORDER — SORBITOL 70 % SOLN
60.0000 mL | Freq: Every day | Status: DC | PRN
  Filled 2013-02-04: qty 60

## 2013-02-04 NOTE — Progress Notes (Signed)
Assessment unchanged. EPW and CT sutures removed steri strips in place. No bleeding and EPW tips intact. Pt on bedrest for 1 hour. Pt tolerated well. Vital signs stable. Will continue to monitor. Earl Williamson

## 2013-02-04 NOTE — Progress Notes (Addendum)
      [address]       [place] [address]             [phone]      5 Days Post-Op Procedure(s) (LRB): CORONARY ARTERY BYPASS GRAFTING (CABG) (N/A) INTRAOPERATIVE TRANSESOPHAGEAL ECHOCARDIOGRAM (N/A)  Subjective:  [name] has no complaints this morning.  He is ambulating without difficulty.  He is tolerating a regular diet.   Objective: Vital signs in last 24 hours: Temp:  [98.1 F (36.7 C)-98.5 F (36.9 C)] 98.5 F (36.9 C) (11/26 0514) Pulse Rate:  [71-82] 71 (11/26 0514) Cardiac Rhythm:  [-] Normal sinus rhythm (11/25 2000) Resp:  [18-20] 20 (11/26 0514) BP: (116-137)/(63-80) 137/78 mmHg (11/26 0514) SpO2:  [97 %-98 %] 97 % (11/26 0514) Weight:  [241 lb 4.8 oz (109.453 kg)] 241 lb 4.8 oz (109.453 kg) (11/26 0514)  Intake/Output from previous day: 11/25 0701 - 11/26 0700 In: 600 [P.O.:600] Out: 1500 [Urine:1500]  General appearance: alert, cooperative and no distress Heart: regular rate and rhythm Lungs: diminished breath sounds bibasilar Abdomen: soft, non-tender; bowel sounds normal; no masses,  no organomegaly Extremities: edema trace Wound: clean and dry  Lab Results:  Recent Labs  02/03/13 0603 02/04/13 0402  WBC 6.1 6.7  HGB 10.5* 10.6*  HCT 31.7* 32.3*  PLT 188 209   BMET:  Recent Labs  02/03/13 0603 02/04/13 0402  NA 138 137  K 4.2 4.4  CL 99 98  CO2 24 27  GLUCOSE 133* 153*  BUN 13 15  CREATININE 0.89 1.02  CALCIUM 8.2* 8.3*    PT/INR: No results found for this basename: LABPROT, INR,  in the last 72 hours ABG    Component Value Date/Time   PHART 7.419 01/31/2013 0247   HCO3 21.5 01/31/2013 0247   TCO2 23 01/31/2013 1648   ACIDBASEDEF 2.0 01/31/2013 0247   O2SAT 97.0 01/31/2013 0247   CBG (last 3)   Recent Labs  02/03/13 1616 02/03/13 2114 02/04/13 0619  GLUCAP 119* 150* 129*    Assessment/Plan: S/P Procedure(s) (LRB): CORONARY ARTERY BYPASS GRAFTING (CABG) (N/A) INTRAOPERATIVE TRANSESOPHAGEAL  ECHOCARDIOGRAM (N/A)  1. CV- NSR, HTN mild- will continue Lopressor and Altace to 5 mg daily 2. Pulm- off oxygen, small bilateral effusions/atelectasis stable, mucinex for cough 3. Renal- creatinine mildly elevated at 1.02, remains volume overloaded at 11 lbs above admission weight- continue diuresis 4. GI- Constipation, + flatus, will order Lactulose 5. DM- Uncontrolled DM, oral medications increased sugars well controlled in hospital, will decrease Levemir  6. Dispo- patient stable, will d/c EPW today, possibly home in AM   LOS: 10 days    Raford Pitcher, ERIN 02/04/2013

## 2013-02-04 NOTE — Progress Notes (Signed)
Patient ambulated approximately 150 ft with RW and RN and tolerated well.  Steady on his feet and HR stable.  Checked O2 sats as patient stated he did feel a bit winded.  He was 100% on RA.  Lungs clear to auscultation.  Will continue to monitor.  Earl Williamson

## 2013-02-04 NOTE — Progress Notes (Signed)
CARDIAC REHAB PHASE I   PRE:  Rate/Rhythm: 81 SR    BP: sitting 127/68    SaO2: 93 RA  MODE:  Ambulation: 350 ft   POST:  Rate/Rhythm: 103 ST    BP: sitting 157/96     SaO2: 96 RA  Fairly steady with RW. C/o hip pain with rest x1. BP elevated after walk. Ed completed. Interested in diet information and has plans to meet with RD postop. Also very interested in [facility] and will send referral to [facility]. [phone]   Harriet Masson CES, ACSM 02/04/2013 10:48 AM

## 2013-02-04 NOTE — Progress Notes (Addendum)
Subjective: No complaints  Objective: Vital signs in last 24 hours: Temp:  [98.1 F (36.7 C)-98.5 F (36.9 C)] 98.5 F (36.9 C) (11/26 0514) Pulse Rate:  [71-87] 87 (11/26 1056) Resp:  [18-20] 20 (11/26 0514) BP: (116-159)/(63-82) 159/82 mmHg (11/26 1056) SpO2:  [95 %-98 %] 95 % (11/26 0856) Weight:  [241 lb 4.8 oz (109.453 kg)] 241 lb 4.8 oz (109.453 kg) (11/26 0514) Last BM Date:  (pre-op)  Intake/Output from previous day: 11/25 0701 - 11/26 0700 In: 600 [P.O.:600] Out: 1500 [Urine:1500] Intake/Output this shift:    Medications Current Facility-Administered Medications  Medication Dose Route Frequency Provider Last Rate Last Dose  . 0.9 %  sodium chloride infusion  250 mL Intravenous PRN Wilmon Pali, PA-C      . acetaminophen (TYLENOL) tablet 650 mg  650 mg Oral Q6H PRN Wilmon Pali, PA-C   650 mg at 02/03/13 0536  . aspirin EC tablet 81 mg  81 mg Oral Daily Kerin Perna, MD   81 mg at 02/04/13 1101  . bisacodyl (DULCOLAX) EC tablet 10 mg  10 mg Oral Daily Wilmon Pali, PA-C   10 mg at 02/04/13 1058   Or  . bisacodyl (DULCOLAX) suppository 10 mg  10 mg Rectal Daily Wilmon Pali, PA-C      . budesonide-formoterol (SYMBICORT) 160-4.5 MCG/ACT inhaler 2 puff  2 puff Inhalation BID Kerin Perna, MD   2 puff at 02/04/13 0856  . clopidogrel (PLAVIX) tablet 75 mg  75 mg Oral Q breakfast Kerin Perna, MD   75 mg at 02/04/13 0809  . docusate sodium (COLACE) capsule 200 mg  200 mg Oral Daily Wilmon Pali, PA-C   200 mg at 02/04/13 1058  . enoxaparin (LOVENOX) injection 30 mg  30 mg Subcutaneous Q24H Delight Ovens, MD   30 mg at 02/04/13 1101  . furosemide (LASIX) injection 40 mg  40 mg Intravenous Daily Kerin Perna, MD   40 mg at 02/04/13 1100  . glimepiride (AMARYL) tablet 2 mg  2 mg Oral Q breakfast Wilmon Pali, PA-C   2 mg at 02/04/13 0810  . guaiFENesin (MUCINEX) 12 hr tablet 600 mg  600 mg Oral BID Wilmon Pali, PA-C   600 mg at 02/04/13 1101    . insulin aspart (novoLOG) injection 0-24 Units  0-24 Units Subcutaneous TID AC & HS Wilmon Pali, PA-C   2 Units at 02/04/13 [phone]  . insulin detemir (LEVEMIR) injection 10 Units  10 Units Subcutaneous Daily Erin Barrett, PA-C   10 Units at 02/04/13 1101  . lactulose (CHRONULAC) 10 GM/15ML solution 30 g  30 g Oral Daily PRN Erin Barrett, PA-C      . levalbuterol (XOPENEX HFA) inhaler 2 puff  2 puff Inhalation Q6H PRN Kerin Perna, MD      . magnesium hydroxide (MILK OF MAGNESIA) suspension 30 mL  30 mL Oral Daily PRN Wilmon Pali, PA-C      . menthol-cetylpyridinium (CEPACOL) lozenge 3 mg  1 lozenge Oral PRN Wilmon Pali, PA-C      . metFORMIN (GLUCOPHAGE) tablet 1,000 mg  1,000 mg Oral BID WC Wilmon Pali, PA-C   1,000 mg at 02/04/13 0809  . metoprolol (LOPRESSOR) injection 2.5-5 mg  2.5-5 mg Intravenous Q2H PRN Wilmon Pali, PA-C   2.5 mg at 01/31/13 0510  . metoprolol tartrate (LOPRESSOR) tablet 25 mg  25 mg Oral BID Wilmon Pali,  PA-C   25 mg at 02/04/13 1058  . ondansetron (ZOFRAN) injection 4 mg  4 mg Intravenous Q6H PRN Wilmon Pali, PA-C   4 mg at 02/02/13 0426  . oxyCODONE (Oxy IR/ROXICODONE) immediate release tablet 5-10 mg  5-10 mg Oral Q3H PRN Wilmon Pali, PA-C   5 mg at 02/03/13 2313  . pantoprazole (PROTONIX) EC tablet 40 mg  40 mg Oral Daily Wilmon Pali, PA-C   40 mg at 02/04/13 0528  . potassium chloride SA (K-DUR,KLOR-CON) CR tablet 20 mEq  20 mEq Oral Daily Wilmon Pali, PA-C   20 mEq at 02/04/13 1100  . ramipril (ALTACE) capsule 5 mg  5 mg Oral Daily Erin Barrett, PA-C   5 mg at 02/04/13 1058  . simvastatin (ZOCOR) tablet 40 mg  40 mg Oral Daily Wilburt Finlay, PA-C   40 mg at 02/04/13 1058  . sodium chloride 0.9 % injection 3 mL  3 mL Intravenous Q12H Gina L Collins, PA-C   3 mL at 02/04/13 1102  . sodium chloride 0.9 % injection 3 mL  3 mL Intravenous PRN Wilmon Pali, PA-C      . traMADol Janean Sark) tablet 50-100 mg  50-100 mg Oral Q4H PRN Wilmon Pali, PA-C   100 mg at 02/04/13 0981    PE: General appearance: alert, cooperative and no distress Lungs: clear to auscultation bilaterally Heart: regular rate and rhythm, S1, S2 normal, no murmur, click, rub or gallop Extremities: 2+ Left LEE Pulses: 2+ and symmetric Skin: Warm and dry Neurologic: Grossly normal  Lab Results:   Recent Labs  02/02/13 0400 02/03/13 0603 02/04/13 0402  WBC 7.7 6.1 6.7  HGB 10.4* 10.5* 10.6*  HCT 31.5* 31.7* 32.3*  PLT 158 188 209   BMET  Recent Labs  02/02/13 0400 02/03/13 0603 02/04/13 0402  NA 137 138 137  K 3.5 4.2 4.4  CL 100 99 98  CO2 26 24 27   GLUCOSE 127* 133* 153*  BUN 13 13 15   CREATININE 0.88 0.89 1.02  CALCIUM 8.1* 8.2* 8.3*    Assessment/Plan   Principal Problem:   NSTEMI (non-ST elevated myocardial infarction) Active Problems:   CAD S/P percutaneous coronary angioplasty - PCI  x 3 stents @ [facility]; CABG x 3 01/30/13    DM type 2 (diabetes mellitus, type 2)   HTN (hypertension), benign   HLD (hyperlipidemia)   S/P CABG x 3  Plan:  Doing well.  Needs further diuresis. Net fluids: -0.9L/-5.4L.  Lopressor was increased yesterday and ramipril increased today.  May need further increase.  Reassess tomorrow.    LOS: 10 days    HAGER, BRYAN 02/04/2013 11:22 AM  He looks good this AM.  Just back from walk.  BP trending up - agree with titrating ACE-I up. Otherwise stable, no arrythmia, angina or SOB.   May have issues with potential Thurs D/c -- noone will be @ home.  He is nearing d/c.  I will be happy to see him in f/u -- we will help arrange.  His primary team will include Wilburt Finlay, PA who is arranging nutrition consultation.  Will need aggressive PCP care for DM management. On statin + ASA.  Based upon CURE trial, would benefit from Plavix (would use in place of ASA).  Marykay Lex, MD

## 2013-02-04 NOTE — Progress Notes (Signed)
Pt had a 10 beat run of Vtach with a rate of 200. Pt. Vital signs stable.Pt. stated he felt "a little flitter". Pt currently asymptomatic and in NSR. RN paged MD. Orders given for a lidocain drip at 1 mg per minute. RN notified oncoming RN of pt episode and orders.  Genevive Bi, RN

## 2013-02-04 NOTE — Discharge Summary (Signed)
[address]       [place] [address]             [phone]              Discharge Summary  Name: Earl Williamson DOB: 05/09/1940 72 y.o. MRN: [id number]   Admission Date: 01/25/2013 Discharge Date:     Admitting Diagnosis: Chest pain   Discharge Diagnosis:  Severe 3 vessel coronary artery disease Non-ST elevation myocardial infarction Expected postoperative blood loss anemia  Past Medical History  Diagnosis Date  . Coronary artery disease   . Diabetes mellitus without complication   . Hypertension   . Sleep apnea t  . Prostate cancer t      Procedures: CORONARY ARTERY BYPASS GRAFTING x 3  (Left internal mammary artery to left anterior descending, saphenous vein graft to posterior descending, saphenous vein great to obtuse marginal 1) ENDOSCOPIC VEIN HARVEST LEFT LEG - 01/30/2013    HPI:  The patient is a 72 y.o. male with a known history of CAD, status post placement of 3 stents at [facility] in the past.  He does not regularly see a cardiologist.  He complains chest pain which had been going on intermittently for approximately 2 weeks. The pain was midsternal and sharp, associated with shortness of breath, and worse with eating and exertion.  He had actually seen his PCP earlier in the week, as he thought it was acid reflux, and was scheduled for an outpatient echocardiogram.  On the date of admission, pain was 7-9/10, so he was brought by EMS to the ER at [facility] for evaluation.  Troponin was elevated, and he was admitted by cardiology with a NSTEMI.    Hospital Course:  The patient was admitted to [facility] on 01/25/2013. Pain resolved on heparin and nitroglycerin, and the patient underwent cardiac catheterization on 01/26/2013.  This revealed severe 3 vessel CAD, including high grade stenosis of the LAD and RCA, with EF 20%.  2D echocardiogram was performed and showed EF 30-35%.  He was felt to be a poor candidate for further  percutaneous intervention, therefore, a cardiac surgery consult was obtained.  Dr. Donata Clay saw the patient and reviewed his films, and he agreed with the need for surgical revascularization.   All risks, benefits and alternatives of surgery were explained in detail, and the patient agreed to proceed.   The patient was taken to the operating room and underwent the above procedure.    The postoperative course has generally been uneventful.  He initially had some abdominal distention, although KUB was negative for ileus.  This was treated conservatively, and his diet was advanced as tolerated.  He was restarted on Plavix for his prior stents to the circumflex.  He was also started on aspirin, statin, beta blocker, and ACE-I, and doses have been titrated for hypertension.  He was noted on admission to have a hemoglobin A1C=10.7, and initially required Levemir for glycemic control.  Presently, he has been restarted on his home oral  medications and the doses are being uptitrated as needed as his insulin is weaned.    Overall, the patient is progressing well.  He is ambulating with cardiac rehab and is doing well with mobility.  He is tolerating a regular diet.  Cardiac status is stable, and he is maintaining sinus rhythm.  He has been volume overloaded, and was started on Lasix, to which he is responding well.  Blood sugars have been well  controlled, and we hope to discontinue insulin altogether prior to discharge.  Incisions are healing well.  We anticipate discharge home in the next 24-48 hours provided no acute changes occur.     Recent vital signs:  Filed Vitals:   02/04/13 0514  BP: 137/78  Pulse: 71  Temp: 98.5 F (36.9 C)  Resp: 20    Recent laboratory studies:  CBC: Recent Labs  02/03/13 0603 02/04/13 0402  WBC 6.1 6.7  HGB 10.5* 10.6*  HCT 31.7* 32.3*  PLT 188 209   BMET:  Recent Labs  02/03/13 0603 02/04/13 0402  NA 138 137  K 4.2 4.4  CL 99 98  CO2 24 27  GLUCOSE 133*  153*  BUN 13 15  CREATININE 0.89 1.02  CALCIUM 8.2* 8.3*    PT/INR: No results found for this basename: LABPROT, INR,  in the last 72 hours   Discharge Medications:      Medication List    STOP taking these medications       metoprolol 200 MG 24 hr tablet  Commonly known as:  TOPROL-XL      TAKE these medications       aspirin 81 MG EC tablet  Take 1 tablet (81 mg total) by mouth daily.     budesonide-formoterol 160-4.5 MCG/ACT inhaler  Commonly known as:  SYMBICORT  Inhale 2 puffs into the lungs 2 (two) times daily.     clopidogrel 75 MG tablet  Commonly known as:  PLAVIX  Take 75 mg by mouth daily with breakfast.     furosemide 40 MG tablet  Commonly known as:  LASIX  Take 1 tablet (40 mg total) by mouth daily. For 7 days     GAS-X EXTRA STRENGTH 125 MG chewable tablet  Generic drug:  simethicone  Chew 125 mg by mouth every 6 (six) hours as needed for flatulence.     glimepiride 2 MG tablet  Commonly known as:  AMARYL  Take 1 tablet (2 mg total) by mouth daily with breakfast.     guaiFENesin 600 MG 12 hr tablet  Commonly known as:  MUCINEX  Take 1 tablet (600 mg total) by mouth 2 (two) times daily.     metFORMIN 1000 MG tablet  Commonly known as:  GLUCOPHAGE  Take 1 tablet (1,000 mg total) by mouth 2 (two) times daily with a meal.     metoprolol tartrate 25 MG tablet  Commonly known as:  LOPRESSOR  Take 1.5 tablets (37.5 mg total) by mouth 2 (two) times daily.     oxyCODONE 5 MG immediate release tablet  Commonly known as:  Oxy IR/ROXICODONE  Take 1-2 tablets (5-10 mg total) by mouth every 3 (three) hours as needed for moderate pain.     pantoprazole 40 MG tablet  Commonly known as:  PROTONIX  Take 40 mg by mouth 2 (two) times daily.     potassium chloride SA 20 MEQ tablet  Commonly known as:  K-DUR,KLOR-CON  Take 1 tablet (20 mEq total) by mouth daily. For 7 days     ramipril 5 MG capsule  Commonly known as:  ALTACE  Take 1 capsule (5 mg total)  by mouth daily.     simvastatin 40 MG tablet  Commonly known as:  ZOCOR  Take 40 mg by mouth daily.     TUMS ULTRA 1000 400 MG tablet  Generic drug:  calcium elemental as carbonate  Chew 1,000 mg by mouth as needed for heartburn.  Discharge Instructions:  The patient is to refrain from driving, heavy lifting or strenuous activity.  May shower daily and clean incisions with soap and water.  May resume regular diet.   Follow Up:  Follow-up Information   Follow up with HARDING,DAVID W, MD. Schedule an appointment as soon as possible for a visit in 2 weeks.   Specialty:  Cardiology   Contact information:   [address] [place] [address] [phone]       Follow up with Mikey Bussing, MD On 02/25/2013. (Have a chest x-ray at [facility] at 1:00, then see MD at 2:00)    Specialty:  Cardiothoracic Surgery   Contact information:   [address] [place] [address] [phone]       Please follow up. (Make an appointment with your primary MD as soon as possible to follow up diabetes (hemoglobin A1C=10.7))       The patient has been discharged on:  1.Beta Blocker: Yes [ x ]  No [ ]   If No, reason:    2.Ace Inhibitor/ARB: Yes [x ]  No [  ]  If No, reason:    3.Statin: Yes [ x ]  No [ ]   If No, reason:    4.Ecasa: Yes [ x ]  No [ ]   If No, reason:     COLLINS,GINA H 02/04/2013, 9:33 AM

## 2013-02-05 DIAGNOSIS — E119 Type 2 diabetes mellitus without complications: Secondary | ICD-10-CM

## 2013-02-05 DIAGNOSIS — E785 Hyperlipidemia, unspecified: Secondary | ICD-10-CM

## 2013-02-05 LAB — GLUCOSE, CAPILLARY
Glucose-Capillary: 105 mg/dL — ABNORMAL HIGH (ref 70–99)
Glucose-Capillary: 172 mg/dL — ABNORMAL HIGH (ref 70–99)
Glucose-Capillary: 108 mg/dL — ABNORMAL HIGH (ref 70–99)

## 2013-02-05 LAB — BASIC METABOLIC PANEL
BUN: 16 mg/dL (ref 6–23)
CO2: 26 mEq/L (ref 19–32)
Calcium: 8.4 mg/dL (ref 8.4–10.5)
Chloride: 99 mEq/L (ref 96–112)
Creatinine, Ser: 0.92 mg/dL (ref 0.50–1.35)
GFR calc Af Amer: >90 mL/min (ref >90)
GFR calc non Af Amer: 83 mL/min — ABNORMAL LOW (ref >90)
Glucose, Bld: 120 mg/dL — ABNORMAL HIGH (ref 70–99)
Potassium: 4.6 mEq/L (ref 3.5–5.1)
Sodium: 138 mEq/L (ref 135–145)

## 2013-02-05 LAB — MAGNESIUM: Magnesium: 2.1 mg/dL (ref 1.5–2.5)

## 2013-02-05 MED ORDER — METOPROLOL TARTRATE 25 MG PO TABS
37.5000 mg | ORAL_TABLET | Freq: Two times a day (BID) | ORAL | Status: DC
  Administered 2013-02-05 – 2013-02-06 (×3): 37.5 mg via ORAL
  Filled 2013-02-05 (×4): qty 1

## 2013-02-05 MED ORDER — MAGNESIUM CITRATE PO SOLN
296.0000 mL | Freq: Once | ORAL | Status: AC
  Administered 2013-02-05: 296 mL via ORAL
  Filled 2013-02-05: qty 296

## 2013-02-05 NOTE — Progress Notes (Signed)
Patient ambulated about 200 feet using rolling walker tolerated well.

## 2013-02-05 NOTE — Progress Notes (Addendum)
[address]       [place] [address]             [phone]      6 Days Post-Op  Procedure(s) (LRB): CORONARY ARTERY BYPASS GRAFTING (CABG) (N/A) INTRAOPERATIVE TRANSESOPHAGEAL ECHOCARDIOGRAM (N/A) Subjective: Feels ok, some constipation  Objective  Telemetry sinus with PVC's  Temp:  [98.2 F (36.8 C)-98.4 F (36.9 C)] 98.4 F (36.9 C) (11/27 0650) Pulse Rate:  [67-87] 70 (11/27 0650) Resp:  [19-20] 19 (11/27 0650) BP: (106-159)/(56-82) 115/69 mmHg (11/27 0650) SpO2:  [95 %-98 %] 98 % (11/27 0650) Weight:  [239 lb 6.7 oz (108.6 kg)] 239 lb 6.7 oz (108.6 kg) (11/27 0650)   Intake/Output Summary (Last 24 hours) at 02/05/13 0749 Last data filed at 02/04/13 2200  Gross per 24 hour  Intake    840 ml  Output   2050 ml  Net  -1210 ml       General appearance: alert, cooperative and no distress Heart: regular rate and rhythm Lungs: mildly dim in bases Abdomen: soft, nontender, + BS Extremities: + Edema LE L>R Wound: incisions healing well  Lab Results:  Recent Labs  02/04/13 0402 02/05/13 0500  NA 137 138  K 4.4 4.6  CL 98 99  CO2 27 26  GLUCOSE 153* 120*  BUN 15 16  CREATININE 1.02 0.92  CALCIUM 8.3* 8.4  MG  --  2.1    Recent Labs  02/04/13 0402  AST 23  ALT 25  ALKPHOS 96  BILITOT 0.5  PROT 6.8  ALBUMIN 2.7*   No results found for this basename: LIPASE, AMYLASE,  in the last 72 hours  Recent Labs  02/03/13 0603 02/04/13 0402  WBC 6.1 6.7  HGB 10.5* 10.6*  HCT 31.7* 32.3*  MCV 86.8 86.8  PLT 188 209   No results found for this basename: CKTOTAL, CKMB, TROPONINI,  in the last 72 hours No components found with this basename: POCBNP,  No results found for this basename: DDIMER,  in the last 72 hours No results found for this basename: HGBA1C,  in the last 72 hours No results found for this basename: CHOL, HDL, LDLCALC, TRIG, CHOLHDL,  in the last 72 hours No results found for this basename: TSH, T4TOTAL,  FREET3, T3FREE, THYROIDAB,  in the last 72 hours No results found for this basename: VITAMINB12, FOLATE, FERRITIN, TIBC, IRON, RETICCTPCT,  in the last 72 hours  Medications: Scheduled . aspirin EC  81 mg Oral Daily  . bisacodyl  10 mg Oral Daily   Or  . bisacodyl  10 mg Rectal Daily  . budesonide-formoterol  2 puff Inhalation BID  . clopidogrel  75 mg Oral Q breakfast  . docusate sodium  200 mg Oral Daily  . enoxaparin (LOVENOX) injection  30 mg Subcutaneous Q24H  . furosemide  40 mg Intravenous Daily  . glimepiride  2 mg Oral Q breakfast  . guaiFENesin  600 mg Oral BID  . insulin aspart  0-24 Units Subcutaneous TID AC & HS  . insulin detemir  10 Units Subcutaneous Daily  . metFORMIN  1,000 mg Oral BID WC  . metoprolol tartrate  25 mg Oral BID  . pantoprazole  40 mg Oral Daily  . potassium chloride  20 mEq Oral Daily  . ramipril  5 mg Oral Daily  . simvastatin  40 mg Oral Daily  . sodium chloride  3 mL Intravenous Q12H     Radiology/Studies:  Dg Chest 2 View  02/04/2013   CLINICAL DATA:  CABG.  EXAM: CHEST  2 VIEW  COMPARISON:  02/03/2013.  FINDINGS: CABG. Cardiomegaly. Pulmonary vascularity normal. Stable left pleural effusion. No pneumothorax. Stable mild bilateral subsegmental atelectasis. Stable apical pleural thickening.  IMPRESSION: 1. Prior CABG.  Stable cardiomegaly and left pleural effusion. 2. Stable bilateral subsegmental atelectasis.   Electronically Signed   By: Maisie Fus  Register   On: 02/04/2013 08:26    INR: Will add last result for INR, ABG once components are confirmed Will add last 4 CBG results once components are confirmed  Assessment/Plan: S/P Procedure(s) (LRB): CORONARY ARTERY BYPASS GRAFTING (CABG) (N/A) INTRAOPERATIVE TRANSESOPHAGEAL ECHOCARDIOGRAM (N/A)  1 doing well overall 2 vent ectopy. Potassium and magnesium ok. Efx 30 % preop. Will increase beta blocker dose. May also be some room to increase ace inhib 3 sugars with pretty good control ,  will see how they look off insulin 4 lactulose ordered    LOS: 11 days    GOLD,WAYNE E 11/27/20147:49 AM  patient examined and medical record reviewed,agree with above note. Possible DC tomorrow- off O2 doing well VAN TRIGT III,Kioni Stahl 02/05/2013

## 2013-02-05 NOTE — Progress Notes (Signed)
Pt exhausted from constantly going back and forth to bathroom this afternoon. Pt feels he has walked enough for now and does not want to walk in halls. Educated pt on importance of walking. Pt is agreeable to try to walk later.

## 2013-02-05 NOTE — Progress Notes (Signed)
Patient refused CPAP tonight. There Isn't a machine in the room at this time. RN aware. Explained to Patient that if they changed their mind, to just have the RN call Respiratory and we would come set them up. 

## 2013-02-05 NOTE — Progress Notes (Signed)
Offered patient to ambulate, patient declined. Says he will try in the am.

## 2013-02-05 NOTE — Progress Notes (Signed)
Attempted first walk today. Pt complaining of feeling dizzy and seeing a black spot in front of right eye. RN assisted pt back in chair and checked vitals. Vitals WDL (BP 124/60).  Will continue to monitor and attempt to walk again later.   Earl Williamson R

## 2013-02-06 MED ORDER — METOPROLOL TARTRATE 25 MG PO TABS: 37.5000 mg | ORAL_TABLET | Freq: Two times a day (BID) | ORAL | Status: DC

## 2013-02-06 MED ORDER — RAMIPRIL 5 MG PO CAPS: 5.0000 mg | ORAL_CAPSULE | Freq: Every day | ORAL | Status: DC

## 2013-02-06 MED ORDER — METFORMIN HCL 1000 MG PO TABS: 1000.0000 mg | ORAL_TABLET | Freq: Two times a day (BID) | ORAL | Status: DC

## 2013-02-06 MED ORDER — GLIMEPIRIDE 2 MG PO TABS: 2.0000 mg | ORAL_TABLET | Freq: Every day | ORAL | Status: AC

## 2013-02-06 MED ORDER — ASPIRIN 81 MG PO TBEC: 81.0000 mg | DELAYED_RELEASE_TABLET | Freq: Every day | ORAL | Status: AC

## 2013-02-06 MED ORDER — FUROSEMIDE 40 MG PO TABS: 40.0000 mg | ORAL_TABLET | Freq: Every day | ORAL | Status: DC

## 2013-02-06 MED ORDER — OXYCODONE HCL 5 MG PO TABS: 5.0000 mg | ORAL_TABLET | ORAL | Status: DC | PRN

## 2013-02-06 MED ORDER — GUAIFENESIN ER 600 MG PO TB12: 600.0000 mg | ORAL_TABLET | Freq: Two times a day (BID) | ORAL | Status: DC

## 2013-02-06 MED ORDER — POTASSIUM CHLORIDE CRYS ER 20 MEQ PO TBCR: 20.0000 meq | EXTENDED_RELEASE_TABLET | Freq: Every day | ORAL | Status: DC

## 2013-02-06 MED ORDER — BUDESONIDE-FORMOTEROL FUMARATE 160-4.5 MCG/ACT IN AERO: 2.0000 | INHALATION_SPRAY | Freq: Two times a day (BID) | RESPIRATORY_TRACT | Status: DC

## 2013-02-06 NOTE — Progress Notes (Signed)
CARDIAC REHAB PHASE I   PRE:  Rate/Rhythm: 68 SR  BP:  Sitting: 133/68     SaO2: 95% ra  MODE:  Ambulation: 350 ft   POST:  Rate/Rhythm: 85 SR  BP:  Sitting: 156/74     SaO2: 99% ra 11:30AM-11:47AM Patient walked with a slow and stable pace with walker. Patient had to stop x2 due to being fatigued.  Patient stated that he was not short of breath.  Patient was left at bedside with wife eating lunch with call bell in reach.    Theresa Duty, [place] 02/06/2013 11:44 AM

## 2013-02-06 NOTE — Progress Notes (Signed)
Discharge teaching done with pt and wife.  Questions answered. Pt and wife verb understanding.  Prescriptions given.

## 2013-02-06 NOTE — Progress Notes (Signed)
      [address]       [place] [address]             [phone]      7 Days Post-Op Procedure(s) (LRB): CORONARY ARTERY BYPASS GRAFTING (CABG) (N/A) INTRAOPERATIVE TRANSESOPHAGEAL ECHOCARDIOGRAM (N/A)  Subjective:  Mr. Blanford has no complaints this morning.  He states he is ready to go home.  + BM  Objective: Vital signs in last 24 hours: Temp:  [97.7 F (36.5 C)-98.4 F (36.9 C)] 98.4 F (36.9 C) (11/28 0453) Pulse Rate:  [72-81] 74 (11/28 0453) Cardiac Rhythm:  [-] Normal sinus rhythm (11/27 1940) Resp:  [18] 18 (11/28 0453) BP: (128-149)/(74-94) 128/75 mmHg (11/28 0453) SpO2:  [96 %-99 %] 96 % (11/28 0453) Weight:  [234 lb 12.6 oz (106.5 kg)] 234 lb 12.6 oz (106.5 kg) (11/28 0453)   Intake/Output from previous day: 11/27 0701 - 11/28 0700 In: 480 [P.O.:480] Out: 1651 [Urine:1650; Stool:1]  General appearance: alert, cooperative and no distress Heart: regular rate and rhythm Lungs: clear to auscultation bilaterally Abdomen: soft, non-tender; bowel sounds normal; no masses,  no organomegaly Extremities: edema trace Wound: clean and dry  Lab Results:  Recent Labs  02/04/13 0402  WBC 6.7  HGB 10.6*  HCT 32.3*  PLT 209   BMET:  Recent Labs  02/04/13 0402 02/05/13 0500  NA 137 138  K 4.4 4.6  CL 98 99  CO2 27 26  GLUCOSE 153* 120*  BUN 15 16  CREATININE 1.02 0.92  CALCIUM 8.3* 8.4    PT/INR: No results found for this basename: LABPROT, INR,  in the last 72 hours ABG    Component Value Date/Time   PHART 7.419 01/31/2013 0247   HCO3 21.5 01/31/2013 0247   TCO2 23 01/31/2013 1648   ACIDBASEDEF 2.0 01/31/2013 0247   O2SAT 97.0 01/31/2013 0247   CBG (last 3)   Recent Labs  02/05/13 1611 02/05/13 2101 02/06/13 0609  GLUCAP 172* 108* 107*    Assessment/Plan: S/P Procedure(s) (LRB): CORONARY ARTERY BYPASS GRAFTING (CABG) (N/A) INTRAOPERATIVE TRANSESOPHAGEAL ECHOCARDIOGRAM (N/A)  1. CV- NSR continue Lopressor and  Altace 2. Pulm- off oxygen, continue IS, Mucinex for cough 3. Renal- weight is almost at baseline good diuresis with IV Lasix, will give PO at discharge 4. DM- preop A1c elevated, patient sugars have been well controlled here, instructed patient to keep sugars at 150 or less, instructed to keep log of sugars and follow up with PCP for further management, PO medications have been increased 5. Dispo- patient is medically stable, good control of sugars, will d/c home today    LOS: 12 days    Jodeci Roarty 02/06/2013

## 2013-02-20 ENCOUNTER — Other Ambulatory Visit: Payer: Self-pay | Admitting: *Deleted

## 2013-02-20 DIAGNOSIS — I2510 Atherosclerotic heart disease of native coronary artery without angina pectoris: Secondary | ICD-10-CM

## 2013-02-22 NOTE — Discharge Summary (Signed)
patient examined and medical record reviewed,agree with above note. VAN TRIGT III,PETER 02/22/2013   

## 2013-02-24 ENCOUNTER — Telehealth: Payer: Self-pay | Admitting: *Deleted

## 2013-02-24 ENCOUNTER — Ambulatory Visit: Payer: Medicare Other | Admitting: Physician Assistant

## 2013-02-24 NOTE — Telephone Encounter (Signed)
Pt was on his way up here for his appointment but turned around and went home because he was having CP. He stated that when he sits in his recliner he is ok but when he lays in bed his chest hurts. He stated that he just took a pain pill. He needed to cx his appointment.

## 2013-02-24 NOTE — Telephone Encounter (Signed)
Returned call and pt verified x 2.  Pt stated this was the first time he has been in a car since the surgery and he had a sharp pain behind the incision.  Stated the pain has been coming and going since the surgery and is worse when he is lying down.  Stated the pain is sharp and radiates to both shoulders, down to the palms of his hands.  Stated when he sits up it goes away. Pt informed provider will be notified for further instructions.  Pt has appt tomorrow w/ Dr. Donata Clay.    Wilburt Finlay, PA-C notified and advised pt keep appt tomorrow and reschedule today's appt to Thursday w/ him.    Pt informed and stated he will have to check w/ his wife b/c she has to take off work to take him to appts.  Pt also advised to use heart pillow for splinting chest while in car and advised to sit on pillow or blanket that may help decrease pain from bumpy roads.  Wife on phone and scheduled appt for Thursday at 2:20pm w/ Wilburt Finlay, PA-C.  Wife stated her granddaughter will bring pt to appt on Thursday.  Informed wheelchairs should be available if needed and granddaughter can drop pt off at elevator before parking so he doesn't have to walk far.  Verbalized understanding and agreed w/ plan.

## 2013-02-25 ENCOUNTER — Encounter: Payer: Self-pay | Admitting: Cardiothoracic Surgery

## 2013-02-25 ENCOUNTER — Other Ambulatory Visit: Payer: Self-pay | Admitting: *Deleted

## 2013-02-25 ENCOUNTER — Ambulatory Visit (INDEPENDENT_AMBULATORY_CARE_PROVIDER_SITE_OTHER): Payer: Medicare Other | Admitting: Cardiothoracic Surgery

## 2013-02-25 ENCOUNTER — Ambulatory Visit
Admission: RE | Admit: 2013-02-25 | Discharge: 2013-02-25 | Disposition: A | Discharge status: Home / Self Care | Payer: 59 | Source: Ambulatory Visit | Attending: Cardiothoracic Surgery | Admitting: Cardiothoracic Surgery

## 2013-02-25 VITALS — BP 138/87 | HR 68 | Resp 16 | Ht 66.0 in | Wt 230.0 lb

## 2013-02-25 DIAGNOSIS — I2510 Atherosclerotic heart disease of native coronary artery without angina pectoris: Secondary | ICD-10-CM

## 2013-02-25 DIAGNOSIS — Z951 Presence of aortocoronary bypass graft: Secondary | ICD-10-CM

## 2013-02-26 ENCOUNTER — Encounter: Payer: Self-pay | Admitting: Physician Assistant

## 2013-02-26 ENCOUNTER — Ambulatory Visit (INDEPENDENT_AMBULATORY_CARE_PROVIDER_SITE_OTHER): Payer: 59 | Admitting: Physician Assistant

## 2013-02-26 VITALS — BP 150/80 | HR 78 | Ht 66.0 in | Wt 221.0 lb

## 2013-02-26 DIAGNOSIS — IMO0001 Reserved for inherently not codable concepts without codable children: Secondary | ICD-10-CM | POA: Insufficient documentation

## 2013-02-26 DIAGNOSIS — E785 Hyperlipidemia, unspecified: Secondary | ICD-10-CM

## 2013-02-26 DIAGNOSIS — I2510 Atherosclerotic heart disease of native coronary artery without angina pectoris: Secondary | ICD-10-CM

## 2013-02-26 DIAGNOSIS — Z951 Presence of aortocoronary bypass graft: Secondary | ICD-10-CM

## 2013-02-26 DIAGNOSIS — E119 Type 2 diabetes mellitus without complications: Secondary | ICD-10-CM

## 2013-02-26 DIAGNOSIS — I10 Essential (primary) hypertension: Secondary | ICD-10-CM

## 2013-02-26 DIAGNOSIS — I2589 Other forms of chronic ischemic heart disease: Secondary | ICD-10-CM

## 2013-02-26 DIAGNOSIS — Z9861 Coronary angioplasty status: Secondary | ICD-10-CM

## 2013-02-26 DIAGNOSIS — I255 Ischemic cardiomyopathy: Secondary | ICD-10-CM

## 2013-02-26 NOTE — Assessment & Plan Note (Signed)
He was referred for medial nutrition therapy and diabetes education

## 2013-02-26 NOTE — Assessment & Plan Note (Signed)
Well compensated.  No acute HF.  Will need follow up 2D echo in a couple months.

## 2013-02-26 NOTE — Assessment & Plan Note (Signed)
He said recent blood sugars were good.  The last was 88.

## 2013-02-26 NOTE — Assessment & Plan Note (Addendum)
BP elevated.  At home it runs lower.  No changes currently.  Follow up in month with Dr. Herbie Baltimore

## 2013-02-26 NOTE — Assessment & Plan Note (Signed)
On Zocor 

## 2013-02-26 NOTE — Patient Instructions (Signed)
1.  Follow up with Dr. Herbie Baltimore in a month.

## 2013-02-26 NOTE — Progress Notes (Signed)
Date:  02/26/2013   ID:  Earl Williamson, DOB 1941/01/19, MRN [id number]  PCP:  Corky Downs, MD  Primary Cardiologist:  Herbie Baltimore    History of Present Illness: Earl Williamson is a 72 y.o. obese male with a known history of CAD, status post placement of 3 stents at [facility] in the past. He does not regularly see a cardiologist.   In November he had an NSTEMI. LHC revealed severe three-vessel disease involving the proximal and mid LAD, proximal mid and distal right with severe LV dysfunction.  He underwent CABGx3 with LIMA to LAD, SVG to PDA and SVG to OM1 by Dr. Donata Clay.  His history also includes uncontrolled diabetes, HTN, prostate cancer.  2D echo reveled an EF of 30-35%, Severe hypokinesis of the mid-distalanteroseptal, anterior, and inferoseptal myocardium. Possible dyskinesis of the apical Myocardium, grade 2 diastolic dysfunction.  LA moderately dilated.  He presents today for evaluation.  He is experiencing some sternal pain and was prescribed percocet.  This seems to be helping. He has some mild LEE but appears to be recovering well.  The patient currently denies nausea, vomiting, fever, shortness of breath, orthopnea, dizziness, PND, cough, congestion, abdominal pain, hematochezia, melena.  Wt Readings from Last 3 Encounters:  02/26/13 221 lb (100.245 kg)  02/25/13 230 lb (104.327 kg)  02/06/13 234 lb 12.6 oz (106.5 kg)     Past Medical History  Diagnosis Date  . Coronary artery disease   . Diabetes mellitus without complication   . Hypertension   . Sleep apnea t  . Prostate cancer t    Current Outpatient Prescriptions  Medication Sig Dispense Refill  . aspirin EC 81 MG EC tablet Take 1 tablet (81 mg total) by mouth daily.      . calcium elemental as carbonate (TUMS ULTRA 1000) 400 MG tablet Chew 1,000 mg by mouth as needed for heartburn.       . clopidogrel (PLAVIX) 75 MG tablet Take 75 mg by mouth daily with breakfast.      . glimepiride (AMARYL) 2 MG tablet Take 1 tablet  (2 mg total) by mouth daily with breakfast.  30 tablet  3  . metFORMIN (GLUCOPHAGE) 1000 MG tablet Take 1 tablet (1,000 mg total) by mouth 2 (two) times daily with a meal.  60 tablet  3  . metoprolol tartrate (LOPRESSOR) 25 MG tablet Take 1.5 tablets (37.5 mg total) by mouth 2 (two) times daily.  90 tablet  3  . oxyCODONE (OXY IR/ROXICODONE) 5 MG immediate release tablet Take 1-2 tablets (5-10 mg total) by mouth every 3 (three) hours as needed for moderate pain.  30 tablet  0  . pantoprazole (PROTONIX) 40 MG tablet Take 40 mg by mouth 2 (two) times daily.       . ramipril (ALTACE) 5 MG capsule Take 1 capsule (5 mg total) by mouth daily.  30 capsule  3  . simethicone (GAS-X EXTRA STRENGTH) 125 MG chewable tablet Chew 125 mg by mouth every 6 (six) hours as needed for flatulence.       . simvastatin (ZOCOR) 40 MG tablet Take 40 mg by mouth daily.       No current facility-administered medications for this visit.    Allergies:   No Known Allergies  Social History:  The patient  reports that he has quit smoking. His smoking use included Cigarettes. He smoked 0.00 packs per day. He has never used smokeless tobacco. He reports that he does not drink alcohol or use  illicit drugs.   Family history:   Family History  Problem Relation Age of Onset  . Heart attack Mother   . Heart attack Sister   . Heart attack Brother     ROS:  Please see the history of present illness.  All other systems reviewed and negative.   PHYSICAL EXAM: VS:  BP 150/80  Pulse 78  Ht 5\' 6"  (1.676 m)  Wt 221 lb (100.245 kg)  BMI 35.69 kg/m2 Obese, well developed, in no acute distress HEENT: Pupils are equal round react to light accommodation extraocular movements are intact.  Neck: No cervical lymphadenopathy. Chest: tender to palpation either side of the sternal wound Cardiac: Regular rate and rhythm without murmurs rubs or gallops. Lungs:  clear to auscultation bilaterally, no wheezing, rhonchi or rales Abd: soft,  nontender, positive bowel sounds all quadrants Ext: 1+ lower extremity edema.  2+ radial and dorsalis pedis pulses. Skin: warm and dry Neuro:  Grossly normal  EKG:  NSR. Inferior q, lateral TWI. 78bpm    ASSESSMENT AND PLAN:  Problem List Items Addressed This Visit   CAD S/P percutaneous coronary angioplasty - PCI  x 3 stents @ [facility]; CABG x 3 01/30/13  (Chronic)   HTN (hypertension), benign (Chronic)     BP elevated.  At home it runs lower.  No changes currently.  Follow up in month with Dr. Herbie Baltimore    HLD (hyperlipidemia) (Chronic)     On Zocor    DM type 2 (diabetes mellitus, type 2)     He said recent blood sugars were good.  The last was 88.      S/P CABG x 3   Cardiomyopathy, ischemic, EF 30-35%, 01/27/13     Well compensated.  No acute HF.  Will need follow up 2D echo in a couple months.    Obesity, Class II, BMI 35-39.9, with comorbidity     He was referred for medial nutrition therapy and diabetes education     Other Visit Diagnoses   CAD (coronary artery disease)    -  Primary    Relevant Orders       EKG 12-Lead

## 2013-02-27 ENCOUNTER — Encounter: Payer: Self-pay | Admitting: Physician Assistant

## 2013-03-18 ENCOUNTER — Other Ambulatory Visit: Payer: Self-pay | Admitting: *Deleted

## 2013-03-18 ENCOUNTER — Ambulatory Visit
Admission: RE | Admit: 2013-03-18 | Discharge: 2013-03-18 | Disposition: A | Discharge status: Home / Self Care | Payer: 59 | Source: Ambulatory Visit | Attending: Cardiothoracic Surgery | Admitting: Cardiothoracic Surgery

## 2013-03-18 ENCOUNTER — Encounter: Payer: Self-pay | Admitting: *Deleted

## 2013-03-18 ENCOUNTER — Encounter: Payer: Self-pay | Admitting: Cardiothoracic Surgery

## 2013-03-18 ENCOUNTER — Ambulatory Visit (INDEPENDENT_AMBULATORY_CARE_PROVIDER_SITE_OTHER): Payer: 59 | Admitting: Cardiothoracic Surgery

## 2013-03-18 VITALS — BP 164/99 | HR 79 | Resp 20 | Ht 66.0 in | Wt 221.0 lb

## 2013-03-18 DIAGNOSIS — I2510 Atherosclerotic heart disease of native coronary artery without angina pectoris: Secondary | ICD-10-CM

## 2013-03-18 DIAGNOSIS — Z951 Presence of aortocoronary bypass graft: Secondary | ICD-10-CM

## 2013-03-18 NOTE — Progress Notes (Signed)
PCP is MASOUD,JAVED, MD Referring Provider is Runell Gess, MD  Chief Complaint  Patient presents with  . Routine Post Op    2 week f/u with CXR S/P CABG 01/30/13    HPI: One month followup after multivessel CABG in a diabetic with severe diffuse coronary disease following non-ST elevation MI and EF 30%. Patient recently reviewed by cardiology and felt to be doing well. Surgical incisions healing nicely. Chest x-ray today shows no effusions or atelectasis. The patient is not walking much and needs to start outpatient cardiac rehabilitation. He has had some persistent musculoskeletal pain of the sternal incision which responds nicely to tramadol. He states his diabetic control is good and he is following his heart healthy diet. He says he is having trouble walking because of the old left hip replacement. The patient recently undergone multiple stents and is on chronic Plavix. He has no symptoms of CHF or recurrent angina.   Past Medical History  Diagnosis Date  . Coronary artery disease   . Diabetes mellitus without complication   . Hypertension   . Sleep apnea t  . Prostate cancer t    Past Surgical History  Procedure Laterality Date  . Joint replacement    . Cardiac surgery    . Coronary artery bypass graft N/A 01/30/2013    Procedure: CORONARY ARTERY BYPASS GRAFTING (CABG);  Surgeon: Kerin Perna, MD;  Location: [facility] OR;  Service: Open Heart Surgery;  Laterality: N/A;  . Intraoperative transesophageal echocardiogram N/A 01/30/2013    Procedure: INTRAOPERATIVE TRANSESOPHAGEAL ECHOCARDIOGRAM;  Surgeon: Kerin Perna, MD;  Location: [facility] OR;  Service: Open Heart Surgery;  Laterality: N/A;    Family History  Problem Relation Age of Onset  . Heart attack Mother   . Heart attack Sister   . Heart attack Brother     Social History History  Substance Use Topics  . Smoking status: Former Smoker    Types: Cigarettes  . Smokeless tobacco: Never Used  . Alcohol Use: No     Current Outpatient Prescriptions  Medication Sig Dispense Refill  . aspirin EC 81 MG EC tablet Take 1 tablet (81 mg total) by mouth daily.      . calcium elemental as carbonate (TUMS ULTRA 1000) 400 MG tablet Chew 1,000 mg by mouth as needed for heartburn.       . clopidogrel (PLAVIX) 75 MG tablet Take 75 mg by mouth daily with breakfast.      . glimepiride (AMARYL) 2 MG tablet Take 1 tablet (2 mg total) by mouth daily with breakfast.  30 tablet  3  . metFORMIN (GLUCOPHAGE) 1000 MG tablet Take 1 tablet (1,000 mg total) by mouth 2 (two) times daily with a meal.  60 tablet  3  . metoprolol tartrate (LOPRESSOR) 25 MG tablet Take 1.5 tablets (37.5 mg total) by mouth 2 (two) times daily.  90 tablet  3  . oxyCODONE (OXY IR/ROXICODONE) 5 MG immediate release tablet Take 1-2 tablets (5-10 mg total) by mouth every 3 (three) hours as needed for moderate pain.  30 tablet  0  . pantoprazole (PROTONIX) 40 MG tablet Take 40 mg by mouth daily.       . ramipril (ALTACE) 5 MG capsule Take 1 capsule (5 mg total) by mouth daily.  30 capsule  3  . simethicone (GAS-X EXTRA STRENGTH) 125 MG chewable tablet Chew 125 mg by mouth every 6 (six) hours as needed for flatulence.       . simvastatin (  ZOCOR) 40 MG tablet Take 40 mg by mouth daily.      . traMADol (ULTRAM) 50 MG tablet Take by mouth every 12 (twelve) hours as needed.       No current facility-administered medications for this visit.    No Known Allergies  Review of Systems improving appetite and overall strength.  BP 164/99  Pulse 79  Resp 20  Ht 5\' 6"  (1.676 m)  Wt 221 lb (100.245 kg)  BMI 35.69 kg/m2  SpO2 98% Physical Exam Alert and comfortable-blood pressure is elevated Lungs clear and equal Sternal incision well-healed Heart rhythm regular without murmur or gallop Leg incision well-healed No pedal edema  Diagnostic Tests: Chest x-ray clear  Impression: Progressing slowly due to minimal angulation at home. He will be referred to  phase II cardiac rehabilitation. Will increase his all paced to 10 mg a day because of hypertension  Plan: Start outpatient rehabilitation and start driving. Lifting limit is 15 pounds until next appointment in 6 weeks. Increase altace  to 10 mg a day and a refill for tramadol is provided.

## 2013-03-31 ENCOUNTER — Telehealth: Payer: Self-pay | Admitting: Cardiology

## 2013-03-31 NOTE — Telephone Encounter (Signed)
Having chest pain off an on.Took a pain pill,he still have it. What should he do?

## 2013-03-31 NOTE — Telephone Encounter (Signed)
Returned call and pt verified x 2.  Pt stated he was having CP this morning.  Stated it started last night and he took a couple of pain pills, oxycodone.  Stated he got up this morning and ate cereal.  Stated he started having pain in his chest around the incision after lying down.  Stated he had sweating w/ the pain and denied SOB.  Pt w/o pain right now.  Stated he took tramadol about 11 pm last night and oxycodone about 3-4 am this morning.  Also took oxycodone about an hour ago and w/o pain now.  Stated he takes oxycodone for pain and doesn't take tramadol that often.  Denied having NTG.  Pt asked if pain could be gas.  Stated he has been belching a lot.  Took Gas-X about 10 pm last night and 7 am this morning. Last BM was small last night and last normal BM was about 3 days ago.  Pt informed he may be constipated from taking oxycodone as it decreases peristalsis, which is the movement of his bowels.  Pt advised to take tramadol every 12 hrs as needed for pain and take oxycodone in between for breakthrough pain (pt has immediate release tabs).  Pt also advised to increase water intake and fiber.  Also advised to lie on L side when lying down to promote bowels moving.  Pt advised to avoid eating too much cereal in AM b/c lactic acid in milk could be causing more gas and to prevent elevated BG.  Pt stated fasting BG was 97 this morning.  Pt stated he has an appt w/ a nutritionist coming up and advised he keep that appt.  Pt verbalized understanding and agreed w/ plan.  Pt has an appt on 1.22.15 w/ Dr. Herbie BaltimoreHarding.  Dr Rennis GoldenHilty notified and agreed w/ plan by RN.  Advised pt call back for sooner appt if no improvement.  Returned call and informed pt per instructions by MD.  Pt advised to call [phone] to ER if he has any CP/pressure w/ SOB, sweating, nausea, other pain and dizziness.  Pt verbalized understanding and agreed w/ plan.

## 2013-04-02 ENCOUNTER — Ambulatory Visit (INDEPENDENT_AMBULATORY_CARE_PROVIDER_SITE_OTHER): Payer: Medicare Other | Admitting: Cardiology

## 2013-04-02 ENCOUNTER — Encounter: Payer: Self-pay | Admitting: Cardiology

## 2013-04-02 VITALS — BP 140/92 | HR 83 | Ht 66.0 in | Wt 230.2 lb

## 2013-04-02 DIAGNOSIS — Z9861 Coronary angioplasty status: Secondary | ICD-10-CM

## 2013-04-02 DIAGNOSIS — I214 Non-ST elevation (NSTEMI) myocardial infarction: Secondary | ICD-10-CM

## 2013-04-02 DIAGNOSIS — I2510 Atherosclerotic heart disease of native coronary artery without angina pectoris: Secondary | ICD-10-CM

## 2013-04-02 DIAGNOSIS — IMO0001 Reserved for inherently not codable concepts without codable children: Secondary | ICD-10-CM

## 2013-04-02 DIAGNOSIS — E669 Obesity, unspecified: Secondary | ICD-10-CM

## 2013-04-02 DIAGNOSIS — I255 Ischemic cardiomyopathy: Secondary | ICD-10-CM

## 2013-04-02 DIAGNOSIS — G4733 Obstructive sleep apnea (adult) (pediatric): Secondary | ICD-10-CM

## 2013-04-02 DIAGNOSIS — E785 Hyperlipidemia, unspecified: Secondary | ICD-10-CM

## 2013-04-02 DIAGNOSIS — I10 Essential (primary) hypertension: Secondary | ICD-10-CM

## 2013-04-02 DIAGNOSIS — I2589 Other forms of chronic ischemic heart disease: Secondary | ICD-10-CM

## 2013-04-02 MED ORDER — PANTOPRAZOLE SODIUM 40 MG PO TBEC: 40.0000 mg | DELAYED_RELEASE_TABLET | Freq: Every day | ORAL | Status: DC

## 2013-04-02 MED ORDER — FUROSEMIDE 20 MG PO TABS: 20.0000 mg | ORAL_TABLET | Freq: Every day | ORAL | Status: DC

## 2013-04-02 MED ORDER — RAMIPRIL 10 MG PO CAPS: 10.0000 mg | ORAL_CAPSULE | Freq: Every day | ORAL | Status: AC

## 2013-04-02 MED ORDER — METOPROLOL TARTRATE 50 MG PO TABS: 50.0000 mg | ORAL_TABLET | Freq: Two times a day (BID) | ORAL | Status: DC

## 2013-04-02 NOTE — Patient Instructions (Signed)
Your physician has requested that you have an echocardiogram. Echocardiography is a painless test that uses sound waves to create images of your heart. It provides your doctor with information about the size and shape of your heart and how well your heart's chambers and valves are working. This procedure takes approximately one hour. There are no restrictions for this procedure.  March of 2015  Increase  Metoprolol 50 mg one tablet twice a day  Lasix (furosemide 20 mg ) once a day if needed  Your physician wants you to follow-up in March 2015 after Echo with Dr Herbie BaltimoreHarding.   You will receive a reminder letter in the mail two months in advance. If you don't receive a letter, please call our office to schedule the follow-up appointment.

## 2013-04-04 ENCOUNTER — Encounter: Payer: Self-pay | Admitting: Cardiology

## 2013-04-04 DIAGNOSIS — G4733 Obstructive sleep apnea (adult) (pediatric): Secondary | ICD-10-CM | POA: Insufficient documentation

## 2013-04-04 NOTE — Progress Notes (Signed)
PATIENT: Earl Williamson MRN: [id number]  DOB: 08-04-1940   DOV:04/04/2013 PCP: Corky Downs, MD  Clinic Note: Chief Complaint  Patient presents with  . ROV 1 month    C/o incisional chest pain on tuesday, used to happen daily but has since started sleeping in a chair and hasn't had any pressure, occas edema in feet and ankles.   HPI: Earl Williamson is a 73 y.o.  male with a PMH below who presents today for his second post CABG/Hospital followup. He presented on November 16 with a non-ST elevation MI having a history of that inferoposterior STEMI back in 2005 with PCI to the RCA, LAD and circumflex. He was cathed by Dr. Allyson Sabal as demonstrated severe multivessel disease and was referred for CABG. He underwent CABG x3 by Dr. Donata Clay. He did have lots of musculoskeletal postoperative chest discomfort, as well as GERD type symptoms. He is using less of his Percocet.  Interval History: Today he is doing relatively well. He still as a musculoskeletal chest discomfort in an, gas type feeling. But he is not noticing exertional chest discomfort. He does have some mild exertional dyspnea and mild PND and orthopnea. He denies any anginal type chest discomfort. He is not able tolerate his CPAP because the pressures are too high. He stopped using it. He has a lot of Lortab edema but nothing significant. He is scheduled to begin cardiac rehabilitation in early February. He is a little bit concerned because he had a history of hip injury that we give him a lot problems on his left side.  The remainder of Cardiovascular ROS: positive for - dyspnea on exertion, orthopnea, paroxysmal nocturnal dyspnea and Musculoskeletal chest pain negative for - irregular heartbeat, loss of consciousness, palpitations, rapid heart rate or shortness of breath: Additional cardiac review of systems: Lightheadedness - no, dizziness - no, syncope/near-syncope - no; TIA/amaurosis fugax - no Melena - no, hematochezia no; hematuria - no;  nosebleeds - no; claudication - no  Past Medical History  Diagnosis Date  . ST elevation myocardial infarction (STEMI) of inferoposterior wall, subsequent episode of care 2005    [facility]) - 100% RCA -- DES; also mCx & LAD lesions  . CAD S/P percutaneous coronary angioplasty 2005    [facility] -- DES to pRCA (culprit for STEMI) - staged DES to mCx & mLAD  . Non-ST elevation myocardial infarction (NSTEMI), subsequent episode of care 01/2013    MultiVessel CAD --> CABG  . CAD in native artery 01/2013    Cath: 50% LAD ISR with 95% post-stent; prox OM1 95%; pRCA stent ~70% ISR, mRCA 90% & 60%, rPDA 99% --> CABG  . 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 Maren Beach -- LIMA-LAD, SVG-OM 1, SVG-RPDA  . Ischemic cardiomyopathy  November 2014    EF by LV gram to 20%; by echocardiogram 30 and 35%  . Diabetes mellitus type 2 in obese     On oral medications  . Hypertension, essential, benign   . Hyperlipidemia LDL goal <70   . Obesity, Class II, BMI 35-39.9   . Obstructive sleep apnea     Not currently using CPAP due to intolerance    Prior Cardiac Evaluation and Past Surgical History: Past Surgical History  Procedure Laterality Date  . Joint replacement    . Coronary stent placement  2005    DES to proximal RCA, mid circumflex and mid LAD in the setting of STEMI  . Coronary artery bypass graft N/A 01/30/2013    Procedure:  CORONARY ARTERY BYPASS GRAFTING (CABG);  Surgeon: Kerin PernaPeter Van Trigt, MD;  [facility];  Service: Open Heart Surgery;  Laterality: N/A;  . Intraoperative transesophageal echocardiogram N/A 01/30/2013    Procedure: INTRAOPERATIVE TRANSESOPHAGEAL ECHOCARDIOGRAM;  Surgeon: Kerin PernaPeter Van Trigt, MD;  [facility];  Service: Open Heart Surgery;  Laterality: N/A;  . Transthoracic echocardiogram  01/27/2013    EF 30-35%. Mildly dilated LV with mild concentric LVH.  Apical dyskinesis with severe HK of mid-distal anterolateral/septal and inferoseptal myocardium; grade 2 diastolic function    No Known  Allergies  Current Outpatient Prescriptions  Medication Sig Dispense Refill  . aspirin EC 81 MG EC tablet Take 1 tablet (81 mg total) by mouth daily.      Marland Kitchen. CALCIUM CARBONATE ANTACID PO Take 2 capsules by mouth as needed.      . clopidogrel (PLAVIX) 75 MG tablet Take 75 mg by mouth daily with breakfast.      . docusate sodium (COLACE) 100 MG capsule Take 100 mg by mouth daily as needed for mild constipation.      Marland Kitchen. glimepiride (AMARYL) 2 MG tablet Take 1 tablet (2 mg total) by mouth daily with breakfast.  30 tablet  3  . metFORMIN (GLUCOPHAGE) 500 MG tablet Take 500 mg by mouth 2 (two) times daily with a meal.      . oxyCODONE-acetaminophen (PERCOCET/ROXICET) 5-325 MG per tablet Take 1 tablet by mouth 2 (two) times daily as needed for severe pain.      . pantoprazole (PROTONIX) 40 MG tablet Take 1 tablet (40 mg total) by mouth daily.  90 tablet  3  . ramipril (ALTACE) 10 MG capsule Take 1 capsule (10 mg total) by mouth daily.  90 capsule  3  . simethicone (GAS-X EXTRA STRENGTH) 125 MG chewable tablet Chew 125 mg by mouth every 6 (six) hours as needed for flatulence.       . simvastatin (ZOCOR) 40 MG tablet Take 40 mg by mouth daily.      . traMADol (ULTRAM) 50 MG tablet Take by mouth every 12 (twelve) hours as needed.      . furosemide (LASIX) 20 MG tablet Take 1 tablet (20 mg total) by mouth daily.  30 tablet  0  . metoprolol (LOPRESSOR) 25 MG tablet Take 1 tablet (25 mg total) by mouth 2 (two) times daily.  180 tablet  3   No current facility-administered medications for this visit.    History   Social History Narrative   He is a married, former smoker. Does not get routine exercise, but is looking forward to cardiac rehabilitation. He does not drink alcohol.    ROS: A comprehensive Review of Systems - Negative except Symptoms noted in history of present illness. He does have notable discomfort in his left hip he says it GERD type symptoms. He's also gained about 9 pounds since earlier  this month. He claims he is gaining back his hospital weight.  PHYSICAL EXAM BP 140/92  Pulse 83  Ht 5\' 6"  (1.676 m)  Wt 230 lb 3.2 oz (104.418 kg)  BMI 37.17 kg/m2 General appearance: alert, cooperative, appears stated age, no distress, moderately obese and Well-nourished and well-groomed. Pleasant mood and affect Neck: no adenopathy, no carotid bruit, supple, symmetrical, trachea midline, thyroid not enlarged, symmetric, no tenderness/mass/nodules and Unable to assess JVP secondary to body habitus  Lungs: clear to auscultation bilaterally, normal percussion bilaterally and  nonlabored with good movements. Mildly diminished in the bases, but no wheezes rales  or rhonchi. Heart: RRR, normal S1 and S2 with a soft S4 gallop. No obvious rubs or murmurs. Nondisplaced PMI Abdomen: soft, non-tender; bowel sounds normal; no masses,  no organomegaly and Moderate to severe truncal obesity. Extremities: Trace edema with no clubbing or cyanosis. Mild venous stasis changes. Pulses: 2+ and symmetric Neurologic: Grossly normal  WUJ:[id number] today: Yes Rate: 83 , Rhythm: NSR;  likely left atrial enlargement. Major septal MI age undetermined; lateral T wave inversions, cannot exclude ischemia. -- No new changes  Recent Labs: Most recent lipids were the time of his MI: Total cholesterol 150, triglycerides 127, HDL 39 LDL 86  ASSESSMENT / PLAN: Cardiomyopathy, ischemic, EF 30-35%, 01/27/13 He does not appear to be having any acute heart failure symptoms, however he does have some PND or orthopnea. Probably NYHA Class 2-3 Currently. He  Plan: Start Lasix 20 mg daily, increase metoprolol to 50 mg twice a day, followup 2-D echocardiogram in March to reassess EF prior to determining whether or not he requires evaluation for ICD placement.  NSTEMI (non-ST elevated myocardial infarction) Relatively sizable MI based on troponin levels. He's also had a history of a STEMI years ago. I don't have a recent Myoview  on him, we'll probably check one in the next year or so just to get a new baseline for him, would expect to have an inferior infarct that involves inferoapex. Hopefully with revascularization is yet will improve.  CAD S/P percutaneous coronary angioplasty - PCI  x 3 stents @ [facility]; CABG x 3 01/30/13   Thankfully no new anginal symptoms following CABG.  Severe multivessel disease noted on his calf. The right posterior lateral system is not grafted nor is the distal circumflex. Thankfully the mid circumflex stent appeared to be widely patent.  He continues to be on Plavix which is certainly reasonable based on his recent MI and existing disease. He is on a beta blocker, statin and ACE inhibitor. He needs aggressive risk factor modification including diabetic control, lipid control and treatment of hypertension. I also think he needs to get reassessed for going back on CPAP for sleep apnea.  Obesity, Class II, BMI 35-39.9, with comorbidity He was referred for diabetic and nutrition counseling. This did not happen as far as I can tell. Hopefully when he does cardiac rehabilitation he'll get some dietary education. Also the exercise will serve to help. He is actually doing the weight back. I did stress the importance of making sure that he wants his dietary intake especially with his diabetes and sleep apnea etc. His wife was present which was helpful. I recommended that she go to some other cardiac rehabilitation classes did discuss nutrition and diet. I told him that his target should be at least a 20-25 pound weight loss in the first year of as 10% of his body weight.  Obstructive sleep apnea A lot of his positional dyspnea with lying down is probably related to his OSA. He says he doesn't tolerate the higher pressures that he is on. I recommended he goes back to sleep Dr. to get this adjusted.  HTN (hypertension), benign Continues to have elevated pressures here despite noting lower recorded at home.  They were play room to increase his metoprolol 50 mg twice a day. He is on a good dose of ACE inhibitor. Her blood pressure range for him should be 120-140 mmHg systolic.  HLD (hyperlipidemia) Actually relatively well controlled at the time of his MI on Zocor. His goal LDL is less than 70. We'll  continue to monitor and consider either increasing Zocor dose versus changing to a more potent statin. Hopefully with weight loss and change in diet, this will improve as well.    Orders Placed This Encounter  Procedures  . EKG 12-Lead  . 2D Echocardiogram without contrast    Standing Status: Future     Number of Occurrences:      Standing Expiration Date: 04/02/2014    Scheduling Instructions:     Need echo in march 2015    Order Specific Question:  Type of Echo    Answer:  Complete    Order Specific Question:  Where should this test be performed    Answer:  [facility]    Order Specific Question:  Reason for exam-Echo    Answer:  Cardiomyopathy-Unspecified  425.9    Order Specific Question:  Reason for exam-Echo    Answer:  CAD Native Vessel  414.01    Order Specific Question:  Reason for exam-Echo    Answer:  Chest Pain  786.50   Meds ordered this encounter  Medications  . ramipril (ALTACE) 10 MG capsule    Sig: Take 1 capsule (10 mg total) by mouth daily.    Dispense:  90 capsule    Refill:  3  . metoprolol (LOPRESSOR) 50 MG tablet    Sig: Take 1 tablet (50 mg total) by mouth 2 (two) times daily.    Dispense:  180 tablet    Refill:  3  . furosemide (LASIX) 20 MG tablet    Sig: Take 1 tablet (20 mg total) by mouth daily.    Dispense:  30 tablet    Refill:  0    Followup: 3 months, and after echocardiogram and initiation of cardiac rehabilitation.  DAVID W. Herbie Baltimore, M.D., M.S. [facility] [address] [place], [place]  [address]  [phone] Pager # [phone]

## 2013-04-04 NOTE — Assessment & Plan Note (Signed)
He was referred for diabetic and nutrition counseling. This did not happen as far as I can tell. Hopefully when he does cardiac rehabilitation he'll get some dietary education. Also the exercise will serve to help. He is actually doing the weight back. I did stress the importance of making sure that he wants his dietary intake especially with his diabetes and sleep apnea etc. His wife was present which was helpful. I recommended that she go to some other cardiac rehabilitation classes did discuss nutrition and diet. I told him that his target should be at least a 20-25 pound weight loss in the first year of as 10% of his body weight.

## 2013-04-04 NOTE — Assessment & Plan Note (Signed)
He does not appear to be having any acute heart failure symptoms, however he does have some PND or orthopnea. Probably NYHA Class 2-3 Currently. He  Plan: Start Lasix 20 mg daily, increase metoprolol to 50 mg twice a day, followup 2-D echocardiogram in March to reassess EF prior to determining whether or not he requires evaluation for ICD placement.

## 2013-04-04 NOTE — Assessment & Plan Note (Signed)
Actually relatively well controlled at the time of his MI on Zocor. His goal LDL is less than 70. We'll continue to monitor and consider either increasing Zocor dose versus changing to a more potent statin. Hopefully with weight loss and change in diet, this will improve as well.

## 2013-04-04 NOTE — Assessment & Plan Note (Addendum)
Thankfully no new anginal symptoms following CABG.  Severe multivessel disease noted on his calf. The right posterior lateral system is not grafted nor is the distal circumflex. Thankfully the mid circumflex stent appeared to be widely patent.  He continues to be on Plavix which is certainly reasonable based on his recent MI and existing disease. He is on a beta blocker, statin and ACE inhibitor. He needs aggressive risk factor modification including diabetic control, lipid control and treatment of hypertension. I also think he needs to get reassessed for going back on CPAP for sleep apnea.

## 2013-04-04 NOTE — Assessment & Plan Note (Signed)
Continues to have elevated pressures here despite noting lower recorded at home. They were play room to increase his metoprolol 50 mg twice a day. He is on a good dose of ACE inhibitor. Her blood pressure range for him should be 120-140 mmHg systolic.

## 2013-04-04 NOTE — Assessment & Plan Note (Signed)
A lot of his positional dyspnea with lying down is probably related to his OSA. He says he doesn't tolerate the higher pressures that he is on. I recommended he goes back to sleep Dr. to get this adjusted.

## 2013-04-04 NOTE — Assessment & Plan Note (Signed)
Relatively sizable MI based on troponin levels. He's also had a history of a STEMI years ago. I don't have a recent Myoview on him, we'll probably check one in the next year or so just to get a new baseline for him, would expect to have an inferior infarct that involves inferoapex. Hopefully with revascularization is yet will improve.

## 2013-04-09 ENCOUNTER — Telehealth: Payer: Self-pay | Admitting: *Deleted

## 2013-04-09 NOTE — Telephone Encounter (Signed)
Pt's wife called stating that [name]'s feet are swollen pretty bad and he wanted to know if he can take 2 Lasix pills.  DH

## 2013-04-09 NOTE — Telephone Encounter (Signed)
Increase Lasix to 40 mg daily x 2-3 days until improves.  Marykay LexHARDING,Nicolette Gieske W, MD

## 2013-04-09 NOTE — Telephone Encounter (Signed)
Returned call and pt verified x 2.  Pt c/o bilateral foot swelling.  Pt put wife on the phone who stated pt is taking furosemide.  Pt back on the phone.  Stated he has had swelling since last Thursday and he was put on the diuretic.  Pt denied SOB.  Pt admits swelling improves after elevation and progresses during the day.  Pt also admits he has not been reading labels to watch his sodium intake, but has not been using salt on food.  RN explained that processed foods have some sodium and it is important to read labels for that reason.  Advice  Elevate LEs when sitting and above level of heart when lying down.  Monitor Na+ intake, read labels  Purchase otc support socks to help w/ circulation (pt is also diabetic)  Continue current dose of furosemide unless call back w/ further instructions  Pt informed Dr. Herbie BaltimoreHarding will be notified and if he advises increase in furosemide or other changes, then he will be notified.  Pt verbalized understanding and agreed w/ plan.  Message forwarded to Dr. Herbie BaltimoreHarding.

## 2013-04-10 ENCOUNTER — Emergency Department (HOSPITAL_COMMUNITY): Payer: 59

## 2013-04-10 ENCOUNTER — Encounter (HOSPITAL_COMMUNITY): Payer: Self-pay | Admitting: Emergency Medicine

## 2013-04-10 ENCOUNTER — Inpatient Hospital Stay (HOSPITAL_COMMUNITY)
Admission: EM | Admit: 2013-04-10 | Discharge: 2013-04-17 | DRG: 216 | Disposition: A | Discharge status: Home / Self Care | Payer: 59 | Attending: Cardiology | Admitting: Cardiology

## 2013-04-10 DIAGNOSIS — E7800 Pure hypercholesterolemia, unspecified: Secondary | ICD-10-CM | POA: Diagnosis present

## 2013-04-10 DIAGNOSIS — IMO0001 Reserved for inherently not codable concepts without codable children: Secondary | ICD-10-CM

## 2013-04-10 DIAGNOSIS — I509 Heart failure, unspecified: Secondary | ICD-10-CM

## 2013-04-10 DIAGNOSIS — I252 Old myocardial infarction: Secondary | ICD-10-CM

## 2013-04-10 DIAGNOSIS — I25810 Atherosclerosis of coronary artery bypass graft(s) without angina pectoris: Secondary | ICD-10-CM | POA: Diagnosis present

## 2013-04-10 DIAGNOSIS — R079 Chest pain, unspecified: Secondary | ICD-10-CM | POA: Diagnosis present

## 2013-04-10 DIAGNOSIS — E119 Type 2 diabetes mellitus without complications: Secondary | ICD-10-CM

## 2013-04-10 DIAGNOSIS — I4729 Other ventricular tachycardia: Secondary | ICD-10-CM | POA: Diagnosis present

## 2013-04-10 DIAGNOSIS — Z87891 Personal history of nicotine dependence: Secondary | ICD-10-CM

## 2013-04-10 DIAGNOSIS — E785 Hyperlipidemia, unspecified: Secondary | ICD-10-CM | POA: Diagnosis present

## 2013-04-10 DIAGNOSIS — E118 Type 2 diabetes mellitus with unspecified complications: Secondary | ICD-10-CM | POA: Diagnosis present

## 2013-04-10 DIAGNOSIS — Z9189 Other specified personal risk factors, not elsewhere classified: Secondary | ICD-10-CM | POA: Diagnosis present

## 2013-04-10 DIAGNOSIS — K5900 Constipation, unspecified: Secondary | ICD-10-CM | POA: Diagnosis not present

## 2013-04-10 DIAGNOSIS — I5023 Acute on chronic systolic (congestive) heart failure: Secondary | ICD-10-CM | POA: Diagnosis present

## 2013-04-10 DIAGNOSIS — I472 Ventricular tachycardia, unspecified: Secondary | ICD-10-CM | POA: Diagnosis present

## 2013-04-10 DIAGNOSIS — I639 Cerebral infarction, unspecified: Secondary | ICD-10-CM

## 2013-04-10 DIAGNOSIS — I2589 Other forms of chronic ischemic heart disease: Secondary | ICD-10-CM | POA: Diagnosis present

## 2013-04-10 DIAGNOSIS — I214 Non-ST elevation (NSTEMI) myocardial infarction: Principal | ICD-10-CM | POA: Diagnosis present

## 2013-04-10 DIAGNOSIS — I255 Ischemic cardiomyopathy: Secondary | ICD-10-CM | POA: Diagnosis present

## 2013-04-10 DIAGNOSIS — I2510 Atherosclerotic heart disease of native coronary artery without angina pectoris: Secondary | ICD-10-CM

## 2013-04-10 DIAGNOSIS — E669 Obesity, unspecified: Secondary | ICD-10-CM | POA: Diagnosis present

## 2013-04-10 DIAGNOSIS — Z6836 Body mass index (BMI) 36.0-36.9, adult: Secondary | ICD-10-CM

## 2013-04-10 DIAGNOSIS — I442 Atrioventricular block, complete: Secondary | ICD-10-CM | POA: Diagnosis present

## 2013-04-10 DIAGNOSIS — Z951 Presence of aortocoronary bypass graft: Secondary | ICD-10-CM

## 2013-04-10 DIAGNOSIS — E876 Hypokalemia: Secondary | ICD-10-CM | POA: Diagnosis not present

## 2013-04-10 DIAGNOSIS — G4733 Obstructive sleep apnea (adult) (pediatric): Secondary | ICD-10-CM | POA: Diagnosis present

## 2013-04-10 DIAGNOSIS — I10 Essential (primary) hypertension: Secondary | ICD-10-CM | POA: Diagnosis present

## 2013-04-10 DIAGNOSIS — R748 Abnormal levels of other serum enzymes: Secondary | ICD-10-CM

## 2013-04-10 DIAGNOSIS — I6340 Cerebral infarction due to embolism of unspecified cerebral artery: Secondary | ICD-10-CM | POA: Diagnosis not present

## 2013-04-10 DIAGNOSIS — G9340 Encephalopathy, unspecified: Secondary | ICD-10-CM | POA: Diagnosis not present

## 2013-04-10 DIAGNOSIS — Z9861 Coronary angioplasty status: Secondary | ICD-10-CM

## 2013-04-10 DIAGNOSIS — R4182 Altered mental status, unspecified: Secondary | ICD-10-CM | POA: Diagnosis present

## 2013-04-10 HISTORY — DX: Altered mental status, unspecified: R41.82

## 2013-04-10 HISTORY — DX: Non-ST elevation (NSTEMI) myocardial infarction: I21.4

## 2013-04-10 HISTORY — DX: Other specified personal risk factors, not elsewhere classified: Z91.89

## 2013-04-10 LAB — COMPREHENSIVE METABOLIC PANEL
ALBUMIN: 3.7 g/dL (ref 3.5–5.2)
ALT: 42 U/L (ref 0–53)
AST: 28 U/L (ref 0–37)
Alkaline Phosphatase: 55 U/L (ref 39–117)
BUN: 19 mg/dL (ref 6–23)
CO2: 21 mEq/L (ref 19–32)
Calcium: 8.9 mg/dL (ref 8.4–10.5)
Chloride: 100 mEq/L (ref 96–112)
Creatinine, Ser: 1.16 mg/dL (ref 0.50–1.35)
GFR calc non Af Amer: 62 mL/min — ABNORMAL LOW (ref >90)
GFR, EST AFRICAN AMERICAN: 71 mL/min — AB (ref >90)
Glucose, Bld: 196 mg/dL — ABNORMAL HIGH (ref 70–99)
POTASSIUM: 3.8 meq/L (ref 3.7–5.3)
Sodium: 141 mEq/L (ref 137–147)
Total Bilirubin: 0.6 mg/dL (ref 0.3–1.2)
Total Protein: 7.2 g/dL (ref 6.0–8.3)

## 2013-04-10 LAB — PRO B NATRIURETIC PEPTIDE: Pro B Natriuretic peptide (BNP): 2362 pg/mL — ABNORMAL HIGH (ref 0–125)

## 2013-04-10 LAB — CBC
HCT: 39.7 % (ref 39.0–52.0)
HEMOGLOBIN: 13.3 g/dL (ref 13.0–17.0)
MCH: 27.5 pg (ref 26.0–34.0)
MCHC: 33.5 g/dL (ref 30.0–36.0)
MCV: 82 fL (ref 78.0–100.0)
Platelets: 181 10*3/uL (ref 150–400)
RBC: 4.84 MIL/uL (ref 4.22–5.81)
RDW: 14.4 % (ref 11.5–15.5)
WBC: 4.8 10*3/uL (ref 4.0–10.5)

## 2013-04-10 LAB — POCT I-STAT TROPONIN I: Troponin i, poc: 0.09 ng/mL (ref 0.00–0.08)

## 2013-04-10 LAB — PROTIME-INR
INR: 0.99 (ref 0.00–1.49)
PROTHROMBIN TIME: 12.9 s (ref 11.6–15.2)

## 2013-04-10 LAB — APTT: APTT: 61 s — AB (ref 24–37)

## 2013-04-10 LAB — TROPONIN I: Troponin I: 0.58 ng/mL (ref <0.30)

## 2013-04-10 MED ORDER — NITROGLYCERIN 0.4 MG SL SUBL
0.4000 mg | SUBLINGUAL_TABLET | SUBLINGUAL | Status: DC | PRN
  Administered 2013-04-12 – 2013-04-14 (×4): 0.4 mg via SUBLINGUAL
  Filled 2013-04-10 (×2): qty 25

## 2013-04-10 MED ORDER — ONDANSETRON HCL 4 MG/2ML IJ SOLN
4.0000 mg | Freq: Once | INTRAMUSCULAR | Status: DC
  Filled 2013-04-10: qty 2

## 2013-04-10 MED ORDER — NITROGLYCERIN 2 % TD OINT
1.0000 [in_us] | TOPICAL_OINTMENT | Freq: Once | TRANSDERMAL | Status: AC
  Administered 2013-04-10: 1 [in_us] via TOPICAL
  Filled 2013-04-10: qty 1

## 2013-04-10 MED ORDER — MORPHINE SULFATE 4 MG/ML IJ SOLN
4.0000 mg | Freq: Once | INTRAMUSCULAR | Status: DC
  Filled 2013-04-10: qty 1

## 2013-04-10 NOTE — ED Notes (Signed)
Pt denies N/V/D, dizziness, lightheadedness, or chest pain at this time.

## 2013-04-10 NOTE — ED Notes (Signed)
Pt presents to the department with mid-sternal chest pain with radiation down the left arm. Pt has a cardiac hx, pt has hx of stents and had triple bypass surgery 8 weeks ago and reports he has had intermittent chest pain every day since he came home, however today the chest pain is worse and has not gone away on his own as it had every other day since he has been home. Pt c/o nausea upon initial assessment. Pt is A&O X4. Pt was HTN upon initial assessment 180/120. Pt was given 1 nitroglycerin, 324 aspirin, and 4mg  of zofran. Pt's BP after nitroglycerin was 120/87. Pt's pain is now a 1/10 reported by EMS after 1 nitroglycerin.

## 2013-04-10 NOTE — ED Provider Notes (Signed)
CSN: [id number]     Arrival date & time 04/10/13  2051 History   First MD Initiated Contact with Patient 04/10/13 2150     Chief Complaint  Patient presents with  . Chest Pain   (Consider location/radiation/quality/duration/timing/severity/associated sxs/prior Treatment) Patient is a 73 y.o. male presenting with chest pain. The history is provided by the patient.  Chest Pain Pain location:  Substernal area Pain quality: sharp and shooting   Pain radiates to:  L arm Pain radiates to the back: no   Pain severity:  Moderate Onset quality:  Gradual Duration:  3 hours Timing:  Constant Progression:  Partially resolved Chronicity:  Recurrent Context: breathing and raising an arm   Context: not eating, not lifting and not at rest   Relieved by:  Nothing Worsened by:  Nothing tried Ineffective treatments:  None tried Associated symptoms: no abdominal pain, no fever, no headache, no palpitations, no shortness of breath and not vomiting   Risk factors: coronary artery disease, diabetes mellitus, high cholesterol, hypertension and male sex     73 year old male with the significant past medical history of a CABG done on 11/21 of this past year. Patient with recurrent chest pain states that it came on this afternoon felt that it was sharp worse with a deep breath or reading of his incision. Patient states he's been having this pain persistently since his CABG usually has about once a day usually gets better with rubbing of the area.  Patient usually takes his narcotic medication which does relieve the pain. Today he took one tablet without relief and then had no relief with the second tablet called EMS. EMS gave him sublingual nitroglycerin which relieved completely relieved his pain.  Past Medical History  Diagnosis Date  . ST elevation myocardial infarction (STEMI) of inferoposterior wall, subsequent episode of care 2005    [facility]) - 100% RCA -- DES; also mCx & LAD lesions  . CAD S/P  percutaneous coronary angioplasty 2005    [facility] -- DES to pRCA (culprit for STEMI) - staged DES to mCx & mLAD  . Non-ST elevation myocardial infarction (NSTEMI), subsequent episode of care 01/2013    MultiVessel CAD --> CABG  . CAD in native artery 01/2013    Cath: 50% LAD ISR with 95% post-stent; prox OM1 95%; pRCA stent ~70% ISR, mRCA 90% & 60%, rPDA 99% --> CABG  . 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 Maren Beach -- LIMA-LAD, SVG-OM 1, SVG-RPDA  . Ischemic cardiomyopathy  November 2014    EF by LV gram to 20%; by echocardiogram 30 and 35%  . Diabetes mellitus type 2 in obese     On oral medications  . Hypertension, essential, benign   . Hyperlipidemia LDL goal <70   . Obesity, Class II, BMI 35-39.9   . Obstructive sleep apnea     Not currently using CPAP due to intolerance   Past Surgical History  Procedure Laterality Date  . Joint replacement    . Coronary stent placement  2005    DES to proximal RCA, mid circumflex and mid LAD in the setting of STEMI  . Coronary artery bypass graft N/A 01/30/2013    Procedure: CORONARY ARTERY BYPASS GRAFTING (CABG);  Surgeon: Kerin Perna, MD;  Location: [facility] OR;  Service: Open Heart Surgery;  Laterality: N/A;  . Intraoperative transesophageal echocardiogram N/A 01/30/2013    Procedure: INTRAOPERATIVE TRANSESOPHAGEAL ECHOCARDIOGRAM;  Surgeon: Kerin Perna, MD;  Location: [facility] OR;  Service: Open Heart Surgery;  Laterality: N/A;  . Transthoracic echocardiogram  01/27/2013    EF 30-35%. Mildly dilated LV with mild concentric LVH.  Apical dyskinesis with severe HK of mid-distal anterolateral/septal and inferoseptal myocardium; grade 2 diastolic function   Family History  Problem Relation Age of Onset  . Heart attack Mother   . Heart attack Sister   . Heart attack Brother    History  Substance Use Topics  . Smoking status: Former Smoker    Types: Cigarettes  . Smokeless tobacco: Never Used  . Alcohol Use: No    Review of Systems  Constitutional:  Negative for fever and chills.  HENT: Negative for congestion and facial swelling.   Eyes: Negative for discharge and visual disturbance.  Respiratory: Negative for shortness of breath.   Cardiovascular: Positive for chest pain. Negative for palpitations.  Gastrointestinal: Negative for vomiting, abdominal pain and diarrhea.  Musculoskeletal: Negative for arthralgias and myalgias.  Skin: Negative for color change and rash.  Neurological: Negative for tremors, syncope and headaches.  Psychiatric/Behavioral: Negative for confusion and dysphoric mood.    Allergies  Review of patient's allergies indicates no known allergies.  Home Medications   Current Outpatient Rx  Name  Route  Sig  Dispense  Refill  . aspirin EC 81 MG EC tablet   Oral   Take 1 tablet (81 mg total) by mouth daily.         Marland Kitchen. CALCIUM CARBONATE ANTACID PO   Oral   Take 2 capsules by mouth as needed.         . clopidogrel (PLAVIX) 75 MG tablet   Oral   Take 75 mg by mouth daily with breakfast.         . docusate sodium (COLACE) 100 MG capsule   Oral   Take 100 mg by mouth daily as needed for mild constipation.         . furosemide (LASIX) 20 MG tablet   Oral   Take 1 tablet (20 mg total) by mouth daily.   30 tablet   0   . glimepiride (AMARYL) 2 MG tablet   Oral   Take 1 tablet (2 mg total) by mouth daily with breakfast.   30 tablet   3   . metFORMIN (GLUCOPHAGE) 500 MG tablet   Oral   Take 500 mg by mouth 2 (two) times daily with a meal.         . metoprolol (LOPRESSOR) 50 MG tablet   Oral   Take 50 mg by mouth 2 (two) times daily.         Marland Kitchen. oxyCODONE-acetaminophen (PERCOCET/ROXICET) 5-325 MG per tablet   Oral   Take 1 tablet by mouth 2 (two) times daily as needed for severe pain.         . pantoprazole (PROTONIX) 40 MG tablet   Oral   Take 1 tablet (40 mg total) by mouth daily.   90 tablet   3   . ramipril (ALTACE) 10 MG capsule   Oral   Take 1 capsule (10 mg total) by  mouth daily.   90 capsule   3   . simethicone (GAS-X EXTRA STRENGTH) 125 MG chewable tablet   Oral   Chew 125 mg by mouth every 6 (six) hours as needed for flatulence.          . simvastatin (ZOCOR) 40 MG tablet   Oral   Take 40 mg by mouth daily.         . traMADol (  ULTRAM) 50 MG tablet   Oral   Take by mouth every 12 (twelve) hours as needed.          BP 148/82  Pulse 84  Temp(Src) 97.6 F (36.4 C) (Oral)  Resp 25  Ht 5\' 6"  (1.676 m)  Wt 225 lb (102.059 kg)  BMI 36.33 kg/m2  SpO2 100% Physical Exam  Constitutional: He is oriented to person, place, and time. He appears well-developed and well-nourished.  HENT:  Head: Normocephalic and atraumatic.  Eyes: EOM are normal. Pupils are equal, round, and reactive to light.  Neck: Normal range of motion. Neck supple. No JVD present.  Cardiovascular: Normal rate and regular rhythm.  Exam reveals no gallop and no friction rub.   No murmur heard. Pulmonary/Chest: No respiratory distress. He has no wheezes. He exhibits tenderness.  Well-healed incision pain reproducible with palpation  Abdominal: He exhibits no distension. There is no rebound and no guarding.  Musculoskeletal: Normal range of motion. He exhibits no edema and no tenderness.  Neurological: He is alert and oriented to person, place, and time.  Skin: No rash noted. No pallor.  Psychiatric: He has a normal mood and affect. His behavior is normal.    ED Course  Procedures (including critical care time) Labs Review Labs Reviewed  POCT I-STAT TROPONIN I - Abnormal; Notable for the following:    Troponin i, poc 0.09 (*)    All other components within normal limits  CBC  COMPREHENSIVE METABOLIC PANEL  APTT  PROTIME-INR   Imaging Review Dg Chest Portable 1 View  04/10/2013   CLINICAL DATA:  Sternotomy for CABG 01/30/2013. Diaphoresis. Lightheaded. Chest pain. Left upper extremity tingling.  EXAM: PORTABLE CHEST - 1 VIEW  COMPARISON:  DG CHEST 2 VIEW dated  03/18/2013; DG CHEST 2 VIEW dated 02/25/2013; DG CHEST 2 VIEW dated 02/04/2013; DG CHEST 2 VIEW dated 02/03/2013; DG CHEST 1V PORT dated 01/30/2013; DG CHEST 2 VIEW dated 01/25/2013  FINDINGS: Sternotomy for CABG. Cardiac silhouette moderately enlarged but stable. Interval development of mild diffuse interstitial pulmonary edema as evidenced by Charyl Dancer being Kerley C lines. No confluent airspace consolidation. No visible pleural effusions.  IMPRESSION: Mild CHF suspected, with stable moderate cardiomegaly and mild diffuse interstitial pulmonary edema.   Electronically Signed   By: Hulan Saas M.D.   On: 04/10/2013 21:30    EKG Interpretation   None       MDM  No diagnosis found. 73 year old male status post CABG comes in with a chief complaint of chest pain. Patient with associated diaphoresis denies any associated nausea or vomiting. Patient with positive troponin we'll check a normal troponin.  Pain not changed from prior sternotomy pain. Patient states does not feel like an MI to him.  Patient with elevated and lab data troponin patient with no residual chest pain given nitro paste. Will consult cardiology for admission.  Melene Plan, MD 04/11/13 [phone]

## 2013-04-11 DIAGNOSIS — R079 Chest pain, unspecified: Secondary | ICD-10-CM

## 2013-04-11 DIAGNOSIS — I5023 Acute on chronic systolic (congestive) heart failure: Secondary | ICD-10-CM

## 2013-04-11 LAB — CBC
HCT: 41.2 % (ref 39.0–52.0)
Hemoglobin: 13.8 g/dL (ref 13.0–17.0)
MCH: 27.4 pg (ref 26.0–34.0)
MCHC: 33.5 g/dL (ref 30.0–36.0)
MCV: 81.7 fL (ref 78.0–100.0)
Platelets: 195 10*3/uL (ref 150–400)
RBC: 5.04 MIL/uL (ref 4.22–5.81)
RDW: 14.4 % (ref 11.5–15.5)
WBC: 4.3 10*3/uL (ref 4.0–10.5)

## 2013-04-11 LAB — TSH: TSH: 0.751 u[IU]/mL (ref 0.350–4.500)

## 2013-04-11 LAB — CREATININE, SERUM
Creatinine, Ser: 0.98 mg/dL (ref 0.50–1.35)
GFR calc Af Amer: >90 mL/min (ref >90)
GFR calc non Af Amer: 81 mL/min — ABNORMAL LOW (ref >90)

## 2013-04-11 LAB — MAGNESIUM: Magnesium: 1.7 mg/dL (ref 1.5–2.5)

## 2013-04-11 MED ORDER — ONDANSETRON HCL 4 MG/2ML IJ SOLN
4.0000 mg | Freq: Four times a day (QID) | INTRAMUSCULAR | Status: DC | PRN
  Administered 2013-04-12 – 2013-04-14 (×2): 4 mg via INTRAVENOUS
  Filled 2013-04-11 (×2): qty 2

## 2013-04-11 MED ORDER — TRAMADOL HCL 50 MG PO TABS
50.0000 mg | ORAL_TABLET | Freq: Two times a day (BID) | ORAL | Status: DC | PRN
  Administered 2013-04-11 (×2): 50 mg via ORAL
  Filled 2013-04-11 (×2): qty 1

## 2013-04-11 MED ORDER — DOCUSATE SODIUM 100 MG PO CAPS
100.0000 mg | ORAL_CAPSULE | Freq: Every day | ORAL | Status: DC | PRN
  Administered 2013-04-12: 100 mg via ORAL
  Filled 2013-04-11 (×4): qty 1

## 2013-04-11 MED ORDER — OXYCODONE-ACETAMINOPHEN 5-325 MG PO TABS
1.0000 | ORAL_TABLET | Freq: Two times a day (BID) | ORAL | Status: DC | PRN
  Administered 2013-04-12 – 2013-04-15 (×4): 1 via ORAL
  Filled 2013-04-11 (×7): qty 1

## 2013-04-11 MED ORDER — SIMVASTATIN 40 MG PO TABS
40.0000 mg | ORAL_TABLET | Freq: Every day | ORAL | Status: DC
  Administered 2013-04-11 – 2013-04-17 (×7): 40 mg via ORAL
  Filled 2013-04-11 (×7): qty 1

## 2013-04-11 MED ORDER — ACETAMINOPHEN 325 MG PO TABS: 650.0000 mg | ORAL_TABLET | ORAL | Status: DC | PRN

## 2013-04-11 MED ORDER — PANTOPRAZOLE SODIUM 40 MG PO TBEC
40.0000 mg | DELAYED_RELEASE_TABLET | Freq: Every day | ORAL | Status: DC
  Administered 2013-04-11 – 2013-04-17 (×7): 40 mg via ORAL
  Filled 2013-04-11 (×6): qty 1

## 2013-04-11 MED ORDER — SODIUM CHLORIDE 0.9 % IV SOLN: 250.0000 mL | INTRAVENOUS | Status: DC | PRN

## 2013-04-11 MED ORDER — SIMETHICONE 80 MG PO CHEW
80.0000 mg | CHEWABLE_TABLET | Freq: Four times a day (QID) | ORAL | Status: DC | PRN
  Administered 2013-04-11 – 2013-04-13 (×5): 80 mg via ORAL
  Filled 2013-04-11 (×6): qty 1

## 2013-04-11 MED ORDER — CLOPIDOGREL BISULFATE 75 MG PO TABS
75.0000 mg | ORAL_TABLET | Freq: Every day | ORAL | Status: DC
  Administered 2013-04-11 – 2013-04-14 (×4): 75 mg via ORAL
  Filled 2013-04-11 (×5): qty 1

## 2013-04-11 MED ORDER — SODIUM CHLORIDE 0.9 % IJ SOLN
3.0000 mL | Freq: Two times a day (BID) | INTRAMUSCULAR | Status: DC
  Administered 2013-04-11 – 2013-04-12 (×4): 3 mL via INTRAVENOUS

## 2013-04-11 MED ORDER — ASPIRIN EC 81 MG PO TBEC
81.0000 mg | DELAYED_RELEASE_TABLET | Freq: Every day | ORAL | Status: DC
  Administered 2013-04-11 – 2013-04-17 (×7): 81 mg via ORAL
  Filled 2013-04-11 (×7): qty 1

## 2013-04-11 MED ORDER — FUROSEMIDE 10 MG/ML IJ SOLN
40.0000 mg | Freq: Two times a day (BID) | INTRAMUSCULAR | Status: DC
  Administered 2013-04-11 – 2013-04-14 (×7): 40 mg via INTRAVENOUS
  Filled 2013-04-11 (×9): qty 4

## 2013-04-11 MED ORDER — FUROSEMIDE 10 MG/ML IJ SOLN
40.0000 mg | Freq: Once | INTRAMUSCULAR | Status: AC
  Administered 2013-04-11: 40 mg via INTRAVENOUS
  Filled 2013-04-11: qty 4

## 2013-04-11 MED ORDER — RAMIPRIL 10 MG PO CAPS
10.0000 mg | ORAL_CAPSULE | Freq: Every day | ORAL | Status: DC
  Administered 2013-04-11 – 2013-04-17 (×7): 10 mg via ORAL
  Filled 2013-04-11 (×7): qty 1

## 2013-04-11 MED ORDER — SODIUM CHLORIDE 0.9 % IJ SOLN: 3.0000 mL | INTRAMUSCULAR | Status: DC | PRN

## 2013-04-11 MED ORDER — METOPROLOL TARTRATE 50 MG PO TABS
50.0000 mg | ORAL_TABLET | Freq: Two times a day (BID) | ORAL | Status: DC
  Administered 2013-04-11 – 2013-04-14 (×8): 50 mg via ORAL
  Filled 2013-04-11 (×2): qty 1
  Filled 2013-04-11: qty 2
  Filled 2013-04-11 (×8): qty 1

## 2013-04-11 MED ORDER — ENOXAPARIN SODIUM 40 MG/0.4ML ~~LOC~~ SOLN
40.0000 mg | SUBCUTANEOUS | Status: DC
  Administered 2013-04-11: 40 mg via SUBCUTANEOUS
  Filled 2013-04-11 (×2): qty 0.4

## 2013-04-11 NOTE — Progress Notes (Signed)
    Primary cardiologist: Dr. Bryan Lemmaavid Harding  Patient admitted by cardiology fellow overnight. History and physical reviewed. He comes in with fairly atypical chest discomfort, states that it feels like sternal pain following his CABG, which he has been experiencing regularly. Only difference this time was more prolonged and severe symptoms. His typical pain medication did not help. Wonders whether he got somewhat better after nitroglycerin in the ambulance. Initial troponin I 0.58, ECG with IVCD, nonspecific overall. He has also been having increased leg edema. Plan is continued observation, cycles cardiac markers to better assess trend. Pro BNP is elevated, he is being diuresed further with IV Lasix. Feels well this morning. Further plans to follow.   Jonelle SidleSamuel G. Delawrence Fridman, M.D., F.A.C.C.

## 2013-04-11 NOTE — Progress Notes (Signed)
Around 12:30 pm I received a call and text page from [employer] advising me that Mr. Despina AriasKelso had a 5 beat run of V-Tach. I checked on pt. Who didn't seem to be in distress and stated he felt a flutter in his chest around the time of the V-Tach runs but stated he thought it was just indigestion or heart burn from something he ate. I paged P.A. on call and advised him of the situation and that I printed and verified the strip. P.A. Understood and declined any further procedures or tests at this time due to the heart beat. Pt. Was not showing any signs or symptoms of distress afterwards and stated he believes the incident may've come from hearing some bad news from his sister yesterday and he started thinking about it and became anxious and upset right before he felt the chest discomfort. Pt. Declined pain medication and only wanted Simethicone for gas and wanted to rest. I educated pt. On importance of stress reduction to prevent future episodes.

## 2013-04-11 NOTE — Progress Notes (Signed)
Received from ED alert and oriented x4, no chest pains and shortness of breath, transported via stretcher, accompanied by RN.  Patient no other contraption but 2 IV access.  Oriented on unit routines, instructed to call for concerns and assistance.  Will monitor and evaluate at intervals.

## 2013-04-11 NOTE — ED Provider Notes (Signed)
I saw and evaluated the patient, reviewed the resident's note and I agree with the findings and plan.  EKG Interpretation    Date/Time:  Friday April 10 2013 21:11:51 EST Ventricular Rate:  81 PR Interval:  192 QRS Duration: 116 QT Interval:  487 QTC Calculation: 565 R Axis:   108 Text Interpretation:  Sinus rhythm Nonspecific intraventricular conduction delay Nonspecific repol abnormality, lateral leads No significant change was found Confirmed by Ismelda Weatherman  MD, Priscille Shadduck ([id number]) on 04/10/2013 9:54:03 PM           Elevated troponin chest pain.  EKG without ischemic changes.  Cardiology consult and admission   Lyanne CoKevin M Ryott Rafferty, MD 04/11/13 [phone]

## 2013-04-11 NOTE — ED Notes (Signed)
Pt given a urinal.

## 2013-04-11 NOTE — H&P (Signed)
Earl Williamson is an 73 y.o. male.     Chief Complaint: chest pain Primary Cardiologist: Dr. Ellyn Hack HPI: Earl Williamson is a 73 yo man with PMH of CAD with multiple prior stents with had 3v CABG 11/14 with last known EF 30-35% by 11/14 Echo, hypertension, dyslipidemia, T2DM who has had ongoing substernal chest pain thought related to chronic angina vs. Sternal wound healing that lasts seconds to minutes but had chest pain for upwards of an hour duration today leading to presentation. He had some SL NTG with potential improvement as he is now CP free. However, what he remembers about his chest tightness is that it is associated with SOB when taking a deep breath (and appears to be new component). No other sick contacts or fever/chills/nausea/vomiting/diarrhea. He may also have some GERD. In the ER he was noted to have a troponin of 0.58 so cardiology was consulted.   Past Medical History  Diagnosis Date  . ST elevation myocardial infarction (STEMI) of inferoposterior wall, subsequent episode of care 2005    [facility]) - 100% RCA -- DES; also mCx & LAD lesions  . CAD S/P percutaneous coronary angioplasty 2005    [facility] -- DES to pRCA (culprit for STEMI) - staged DES to mCx & mLAD  . Non-ST elevation myocardial infarction (NSTEMI), subsequent episode of care 01/2013    MultiVessel CAD --> CABG  . CAD in native artery 01/2013    Cath: 50% LAD ISR with 95% post-stent; prox OM1 95%; pRCA stent ~70% ISR, mRCA 90% & 60%, rPDA 99% --> CABG  . 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 Darcey Nora -- LIMA-LAD, SVG-OM 1, SVG-RPDA  . Ischemic cardiomyopathy  November 2014    EF by LV gram to 20%; by echocardiogram 30 and 35%  . Diabetes mellitus type 2 in obese     On oral medications  . Hypertension, essential, benign   . Hyperlipidemia LDL goal <70   . Obesity, Class II, BMI 35-39.9   . Obstructive sleep apnea     Not currently using CPAP due to intolerance    Past Surgical History  Procedure Laterality Date  . Joint  replacement    . Coronary stent placement  2005    DES to proximal RCA, mid circumflex and mid LAD in the setting of STEMI  . Coronary artery bypass graft N/A 01/30/2013    Procedure: CORONARY ARTERY BYPASS GRAFTING (CABG);  Surgeon: Ivin Poot, MD;  Location: [place];  Service: Open Heart Surgery;  Laterality: N/A;  . Intraoperative transesophageal echocardiogram N/A 01/30/2013    Procedure: INTRAOPERATIVE TRANSESOPHAGEAL ECHOCARDIOGRAM;  Surgeon: Ivin Poot, MD;  Location: [place];  Service: Open Heart Surgery;  Laterality: N/A;  . Transthoracic echocardiogram  01/27/2013    EF 30-35%. Mildly dilated LV with mild concentric LVH.  Apical dyskinesis with severe HK of mid-distal anterolateral/septal and inferoseptal myocardium; grade 2 diastolic function    Family History  Problem Relation Age of Onset  . Heart attack Mother   . Heart attack Sister   . Heart attack Brother    Social History:  reports that he has quit smoking. His smoking use included Cigarettes. He smoked 0.00 packs per day. He has never used smokeless tobacco. He reports that he does not drink alcohol or use illicit drugs.  Allergies: No Known Allergies   (Not in a hospital admission)  Results for orders placed during the hospital encounter of 04/10/13 (from the past 48 hour(s))  COMPREHENSIVE METABOLIC  PANEL     Status: Abnormal   Collection Time    04/10/13  9:12 PM      Result Value Range   Sodium 141  137 - 147 mEq/L   Potassium 3.8  3.7 - 5.3 mEq/L   Chloride 100  96 - 112 mEq/L   CO2 21  19 - 32 mEq/L   Glucose, Bld 196 (*) 70 - 99 mg/dL   BUN 19  6 - 23 mg/dL   Creatinine, Ser 1.16  0.50 - 1.35 mg/dL   Calcium 8.9  8.4 - 10.5 mg/dL   Total Protein 7.2  6.0 - 8.3 g/dL   Albumin 3.7  3.5 - 5.2 g/dL   AST 28  0 - 37 U/L   ALT 42  0 - 53 U/L   Alkaline Phosphatase 55  39 - 117 U/L   Total Bilirubin 0.6  0.3 - 1.2 mg/dL   GFR calc non Af Amer 62 (*) >90 mL/min   GFR calc Af Amer 71 (*) >90 mL/min     Comment: (NOTE)     The eGFR has been calculated using the CKD EPI equation.     This calculation has not been validated in all clinical situations.     eGFR's persistently <90 mL/min signify possible Chronic Kidney     Disease.  CBC     Status: None   Collection Time    04/10/13  9:12 PM      Result Value Range   WBC 4.8  4.0 - 10.5 K/uL   RBC 4.84  4.22 - 5.81 MIL/uL   Hemoglobin 13.3  13.0 - 17.0 g/dL   HCT 39.7  39.0 - 52.0 %   MCV 82.0  78.0 - 100.0 fL   MCH 27.5  26.0 - 34.0 pg   MCHC 33.5  30.0 - 36.0 g/dL   RDW 14.4  11.5 - 15.5 %   Platelets 181  150 - 400 K/uL  APTT     Status: Abnormal   Collection Time    04/10/13  9:12 PM      Result Value Range   aPTT 61 (*) 24 - 37 seconds   Comment:            IF BASELINE aPTT IS ELEVATED,     SUGGEST PATIENT RISK ASSESSMENT     BE USED TO DETERMINE APPROPRIATE     ANTICOAGULANT THERAPY.  PROTIME-INR     Status: None   Collection Time    04/10/13  9:12 PM      Result Value Range   Prothrombin Time 12.9  11.6 - 15.2 seconds   INR 0.99  0.00 - 1.49  POCT I-STAT TROPONIN I     Status: Abnormal   Collection Time    04/10/13  9:20 PM      Result Value Range   Troponin i, poc 0.09 (*) 0.00 - 0.08 ng/mL   Comment NOTIFIED PHYSICIAN     Comment 3            Comment: Due to the release kinetics of cTnI,     a negative result within the first hours     of the onset of symptoms does not rule out     myocardial infarction with certainty.     If myocardial infarction is still suspected,     repeat the test at appropriate intervals.  TROPONIN I     Status: Abnormal   Collection Time    04/10/13  10:17 PM      Result Value Range   Troponin I 0.58 (*) <0.30 ng/mL   Comment:            Due to the release kinetics of cTnI,     a negative result within the first hours     of the onset of symptoms does not rule out     myocardial infarction with certainty.     If myocardial infarction is still suspected,     repeat the test at  appropriate intervals.     CRITICAL RESULT CALLED TO, READ BACK BY AND VERIFIED WITH:     LAMBERT,R RN 04/10/2013 2259 JORDANS  PRO B NATRIURETIC PEPTIDE     Status: Abnormal   Collection Time    04/10/13 10:17 PM      Result Value Range   Pro B Natriuretic peptide (BNP) 2362.0 (*) 0 - 125 pg/mL   Dg Chest Portable 1 View  04/10/2013   CLINICAL DATA:  Sternotomy for CABG 01/30/2013. Diaphoresis. Lightheaded. Chest pain. Left upper extremity tingling.  EXAM: PORTABLE CHEST - 1 VIEW  COMPARISON:  DG CHEST 2 VIEW dated 03/18/2013; DG CHEST 2 VIEW dated 02/25/2013; DG CHEST 2 VIEW dated 02/04/2013; DG CHEST 2 VIEW dated 02/03/2013; DG CHEST 1V PORT dated 01/30/2013; DG CHEST 2 VIEW dated 01/25/2013  FINDINGS: Sternotomy for CABG. Cardiac silhouette moderately enlarged but stable. Interval development of mild diffuse interstitial pulmonary edema as evidenced by Awanda Mink being Kerley C lines. No confluent airspace consolidation. No visible pleural effusions.  IMPRESSION: Mild CHF suspected, with stable moderate cardiomegaly and mild diffuse interstitial pulmonary edema.   Electronically Signed   By: Evangeline Dakin M.D.   On: 04/10/2013 21:30    Review of Systems  Constitutional: Negative for fever, chills, weight loss and malaise/fatigue.       Weight gain  HENT: Negative for ear pain.   Eyes: Negative for blurred vision and pain.  Respiratory: Positive for shortness of breath. Negative for cough.   Cardiovascular: Positive for chest pain and leg swelling. Negative for palpitations and orthopnea.  Gastrointestinal: Positive for heartburn. Negative for nausea, vomiting, abdominal pain and blood in stool.  Genitourinary: Negative for dysuria and hematuria.  Musculoskeletal: Negative for myalgias and neck pain.  Skin: Negative for rash.  Neurological: Negative for dizziness, tingling, tremors and headaches.  Endo/Heme/Allergies: Negative for environmental allergies and polydipsia.    Psychiatric/Behavioral: Negative for suicidal ideas, hallucinations and substance abuse.    Blood pressure 130/83, pulse 79, temperature 97.6 F (36.4 C), temperature source Oral, resp. rate 21, height 5' 6"  (1.676 m), weight 102.059 kg (225 lb), SpO2 95.00%. Physical Exam  Nursing note and vitals reviewed. Constitutional: He is oriented to person, place, and time. He appears well-developed and well-nourished. No distress.  HENT:  Head: Normocephalic and atraumatic.  Nose: Nose normal.  Mouth/Throat: Oropharynx is clear and moist. No oropharyngeal exudate.  Eyes: Conjunctivae and EOM are normal. Pupils are equal, round, and reactive to light. No scleral icterus.  Neck: Normal range of motion. Neck supple. JVD present. No tracheal deviation present. No thyromegaly present.  JVP midneck with slight HJR  Cardiovascular: Normal rate, regular rhythm and intact distal pulses.  Exam reveals no gallop.   Murmur heard. Soft systolic murmur at LLSB  GI: Soft. Bowel sounds are normal. He exhibits no distension. There is no tenderness. There is no rebound.  Musculoskeletal: Normal range of motion. He exhibits edema. He exhibits no tenderness.  Neurological: He is  alert and oriented to person, place, and time. No cranial nerve deficit. Coordination normal.  Skin: Skin is warm and dry. No rash noted. He is not diaphoretic. No erythema.  Psychiatric: He has a normal mood and affect. His behavior is normal. Thought content normal.  labs reviewed; na 141, K 3.8, bun/cr 19/1.16, wbc 4.8, plt 181, h/h 13.3/39.7, BNP 2362, Trop 0.58 EKG NSR 11/14 Echo EF 30-35% with signifciant WMA Chest x-ray: pulmonary edema, ? Left effusion  Problem List Chest Pain Acute on chronic systolic HF Elevated BNP, mildly elevated troponin Systolic dysfunction [id number], dyslipidemia, hypertension Coronary artery disease  Assessment/Plan Earl Williamson is a 73 yo man with PMH of coronary artery disease, LV dysfunction, T2DM,  dyslipidemia, hypertension who presents with slightly increased duration of CP and found to have elevated troponin and BNP. I favor a diagnosis of acute on chronic HF given bilateral left > right edema with only marginally higher troponin. He has elevated JVP and edema along with 10 lb weight gain.  - IV Lasix 40 mg IV x1; 40 mg IV q12h written for - trend cardiac markers - admit to inpatient, update labs - education about HF diet, limited salk intake - cotinue metoprolol 50 mg bid (will need to consolidate for HF indication) - continue ace- - holding metformin/glipizide for inpatient statius  Oneda Duffett 04/11/2013, 2:18 AM

## 2013-04-12 DIAGNOSIS — Z951 Presence of aortocoronary bypass graft: Secondary | ICD-10-CM

## 2013-04-12 DIAGNOSIS — I214 Non-ST elevation (NSTEMI) myocardial infarction: Secondary | ICD-10-CM

## 2013-04-12 HISTORY — DX: Non-ST elevation (NSTEMI) myocardial infarction: I21.4

## 2013-04-12 LAB — BASIC METABOLIC PANEL
BUN: 19 mg/dL (ref 6–23)
CO2: 28 mEq/L (ref 19–32)
Calcium: 9 mg/dL (ref 8.4–10.5)
Chloride: 103 mEq/L (ref 96–112)
Creatinine, Ser: 1.07 mg/dL (ref 0.50–1.35)
GFR calc Af Amer: 79 mL/min — ABNORMAL LOW (ref >90)
GFR, EST NON AFRICAN AMERICAN: 68 mL/min — AB (ref >90)
GLUCOSE: 112 mg/dL — AB (ref 70–99)
POTASSIUM: 4 meq/L (ref 3.7–5.3)
Sodium: 145 mEq/L (ref 137–147)

## 2013-04-12 LAB — GLUCOSE, CAPILLARY
GLUCOSE-CAPILLARY: 187 mg/dL — AB (ref 70–99)
Glucose-Capillary: 131 mg/dL — ABNORMAL HIGH (ref 70–99)

## 2013-04-12 LAB — TROPONIN I
Troponin I: 2.16 ng/mL (ref <0.30)
Troponin I: 1.96 ng/mL (ref <0.30)
Troponin I: 2.05 ng/mL (ref <0.30)

## 2013-04-12 LAB — HEPARIN LEVEL (UNFRACTIONATED): Heparin Unfractionated: 0.27 IU/mL — ABNORMAL LOW (ref 0.30–0.70)

## 2013-04-12 LAB — MRSA PCR SCREENING: MRSA by PCR: NEGATIVE

## 2013-04-12 MED ORDER — NITROGLYCERIN IN D5W 200-5 MCG/ML-% IV SOLN
3.0000 ug/min | INTRAVENOUS | Status: DC
  Administered 2013-04-12: 3 ug/min via INTRAVENOUS
  Filled 2013-04-12: qty 250

## 2013-04-12 MED ORDER — HEPARIN (PORCINE) IN NACL 100-0.45 UNIT/ML-% IJ SOLN
1100.0000 [IU]/h | INTRAMUSCULAR | Status: DC
  Administered 2013-04-12: 1100 [IU]/h via INTRAVENOUS
  Filled 2013-04-12: qty 250

## 2013-04-12 MED ORDER — HEPARIN BOLUS VIA INFUSION
4000.0000 [IU] | Freq: Once | INTRAVENOUS | Status: DC
  Filled 2013-04-12: qty 4000

## 2013-04-12 MED ORDER — HEPARIN (PORCINE) IN NACL 100-0.45 UNIT/ML-% IJ SOLN
1300.0000 [IU]/h | INTRAMUSCULAR | Status: DC
  Administered 2013-04-13: 1300 [IU]/h via INTRAVENOUS
  Filled 2013-04-12: qty 250

## 2013-04-12 NOTE — Progress Notes (Signed)
Pt. Called nurses station to report that he was having active chest pain, I entered room and found pt. Sitting on side of bed and he stated chest pain began when he was sitting in the chair. I took vitals, administered Sublingual Nitro 0.4 mg, gave pt. Oxygen, did STAT EKG, notified MD on call, pt.'s chest pain subsided after 1 Sublingual Nitro, about 30 minutes later pt. Complained of chest pain again. Gave pt. Another sublingual Nitro tablet, MD ordered Nitro Drip & Heparin, which I started pt. On. New IV added in Right Lateral Wrist for Heparin. MD also ordered for pt. To be transferred to Step Down when bed available. Pt. Stable and denied any further chest pain.

## 2013-04-12 NOTE — Progress Notes (Signed)
Primary cardiologist: Dr. Bryan Lemma  Subjective:    Recurrent chest discomfort this morning, describes a cramping sensation in his epigastric/lower sternal area. States that this is what he has been experiencing, somewhat worse. Got better with nitroglycerin.  Objective:   Temp:  [97.3 F (36.3 C)-98.2 F (36.8 C)] 98.1 F (36.7 C) (02/01 0500) Pulse Rate:  [62-91] 86 (02/01 1010) Resp:  [18] 18 (02/01 0500) BP: (98-165)/(53-98) 122/53 mmHg (02/01 1010) SpO2:  [97 %-100 %] 97 % (02/01 0500) Weight:  [220 lb 8 oz (100.018 kg)] 220 lb 8 oz (100.018 kg) (02/01 0500) Last BM Date: 04/10/13  Filed Weights   04/10/13 2118 04/11/13 0328 04/12/13 0500  Weight: 225 lb (102.059 kg) 224 lb 8 oz (101.833 kg) 220 lb 8 oz (100.018 kg)    Intake/Output Summary (Last 24 hours) at 04/12/13 1056 Last data filed at 04/12/13 0500  Gross per 24 hour  Intake    720 ml  Output   1900 ml  Net  -1180 ml    Telemetry: Sinus rhythm.  Exam:  General: No distress.  Lungs: Decreased but clear breath sounds.  Cardiac: RRR, no gallop.  Abdomen: NABS.  Extremities: No pitting.   Lab Results:  Basic Metabolic Panel:  Recent Labs Lab 04/10/13 2112 04/11/13 0434 04/12/13 0415  NA 141  --  145  K 3.8  --  4.0  CL 100  --  103  CO2 21  --  28  GLUCOSE 196*  --  112*  BUN 19  --  19  CREATININE 1.16 0.98 1.07  CALCIUM 8.9  --  9.0  MG  --  1.7  --     Liver Function Tests:  Recent Labs Lab 04/10/13 2112  AST 28  ALT 42  ALKPHOS 55  BILITOT 0.6  PROT 7.2  ALBUMIN 3.7    CBC:  Recent Labs Lab 04/10/13 2112 04/11/13 0434  WBC 4.8 4.3  HGB 13.3 13.8  HCT 39.7 41.2  MCV 82.0 81.7  PLT 181 195    Cardiac Enzymes:  Recent Labs Lab 04/10/13 2217  TROPONINI 0.58*    BNP:  Recent Labs  04/10/13 2217  PROBNP 2362.0*    Coagulation:  Recent Labs Lab 04/10/13 2112  INR 0.99    ECG: Followup tracing with progressive inferior ST elevation,  lateral ST depression.   Medications:   Scheduled Medications: . aspirin EC  81 mg Oral Daily  . clopidogrel  75 mg Oral Q breakfast  . enoxaparin (LOVENOX) injection  40 mg Subcutaneous Q24H  . furosemide  40 mg Intravenous Q12H  . metoprolol  50 mg Oral BID  . ondansetron (ZOFRAN) IV  4 mg Intravenous Once  . pantoprazole  40 mg Oral Daily  . ramipril  10 mg Oral Daily  . simvastatin  40 mg Oral Daily  . sodium chloride  3 mL Intravenous Q12H      PRN Medications:  sodium chloride, acetaminophen, docusate sodium, nitroGLYCERIN, ondansetron (ZOFRAN) IV, oxyCODONE-acetaminophen, simethicone, sodium chloride, traMADol   Assessment:   1. Recurrent chest pain, initial troponin I 0.58, no further markers written by cardiology fellow. Pain again today, somewhat more tense and better with nitroglycerin. ECG shows concerning changes for inferior injury current with symptoms. Early graft failure should be considered.  2. 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  3. Hypertension, blood pressure stable.  4. Ischemic cardiomyopathy, LVEF 30-35% as of  November 2014.  5. Possible component of acute on chronic systolic heart failure.   Plan/Discussion:    Discussed with nursing and patient. Recommend transfer to step down. He will be placed on heparin infusion as well as nitroglycerin drip. Further cycling of cardiac markers being ordered, followup ECG. Although recently status post CABG in November 2014, would consider early graft failure, and possible diagnostic cardiac catheterization tomorrow, sooner if necessary.   Jonelle SidleSamuel G. Ambera Fedele, M.D., F.A.C.C.

## 2013-04-12 NOTE — Progress Notes (Signed)
ANTICOAGULATION CONSULT NOTE - Follow Up Consult    HL = 0.27 (goal 0.3 - 0.7 units/mL) Heparin dosing weight = 86 kg   Assessment: [name] on IV heparin for chest pain.  Heparin level slightly below goal.  No bleeding reported.  No complication with infusion per RN.   Plan: - Increase heparin gtt to 1300 units/hr - F/U AM labs    Earl Williamson D. Laney Potashang, PharmD, BCPS Pager:  [phone] 04/12/2013, 8:37 PM

## 2013-04-12 NOTE — Progress Notes (Signed)
    Please see full rounding note from earlier this morning. Patient now in stepdown unit, on nitroglycerin drip and heparin. He is not reporting any active chest pain at this time. I reviewed followup ECG showing improved ST segment changes inferiorly, suspect injury current earlier today. Followup troponin up to 2.16. Concern at this time would be for early graft failure as already noted. I discussed the situation with the patient and his wife, and he has been placed on the schedule for cardiac catheterization tomorrow to assess graft patency.  Jonelle SidleSamuel G. McDowell, M.D., F.A.C.C.

## 2013-04-12 NOTE — Progress Notes (Signed)
Notified by Clydie BraunKaren, CMT that patient had converted to 3rd degree heart block.  When reviewing strip, patient had not converted into heart block, but was going in and out of a junctional rhythm and had returned to a sinus rhythm.  Patient remained asymptomatic throughout.  EKG and vitals obtained, all remained WNL and MD on call notified.  Dr Verdie MosherLiu reviewed EKG and junctional rhythm strip-no new orders received at this time.  Will continue to monitor. Blood pressure 119/55, pulse 64, temperature 97.6 F (36.4 C), temperature source Oral, resp. rate 18, height 5\' 6"  (1.676 m), weight 101.833 kg (224 lb 8 oz), SpO2 100.00%. Dan Humphreys.Darral Rishel, Burnett ShengAleasha M

## 2013-04-12 NOTE — Progress Notes (Signed)
Patient had c/o epigastic/mid chest soreness/discomfort. He starts this has been chronic since his CABG in November 2014. Dr Verdie MosherLiu notified and aware.  PRN Tramadol administered as ordered. Patient currently resting comfortably, pain free. Will continue to monitor. Troy SineWalker, Kambrey Hagger M

## 2013-04-12 NOTE — Consult Note (Signed)
ANTICOAGULATION CONSULT NOTE - Initial Consult  Pharmacy Consult for heparin Indication: chest pain/ACS  No Known Allergies  Patient Measurements: Height: 5\' 6"  (167.6 cm) Weight: 220 lb 8 oz (100.018 kg) IBW/kg (Calculated) : 63.8 Heparin Dosing Weight: 85.8 kg  Vital Signs: Temp: 98.1 F (36.7 C) (02/01 0500) Temp src: Oral (02/01 0500) BP: 110/65 mmHg (02/01 1114) Pulse Rate: 91 (02/01 1114)  Labs:  Recent Labs  04/10/13 2112 04/10/13 2217 04/11/13 0434 04/12/13 0415  HGB 13.3  --  13.8  --   HCT 39.7  --  41.2  --   PLT 181  --  195  --   APTT 61*  --   --   --   LABPROT 12.9  --   --   --   INR 0.99  --   --   --   CREATININE 1.16  --  0.98 1.07  TROPONINI  --  0.58*  --   --     Estimated Creatinine Clearance: 70.1 ml/min (by C-G formula based on Cr of 1.07).   Medical History: Past Medical History  Diagnosis Date  . ST elevation myocardial infarction (STEMI) of inferoposterior wall, subsequent episode of care 2005    [facility]) - 100% RCA -- DES; also mCx & LAD lesions  . CAD S/P percutaneous coronary angioplasty 2005    [facility] -- DES to pRCA (culprit for STEMI) - staged DES to mCx & mLAD  . Non-ST elevation myocardial infarction (NSTEMI), subsequent episode of care 01/2013    MultiVessel CAD --> CABG  . CAD in native artery 01/2013    Cath: 50% LAD ISR with 95% post-stent; prox OM1 95%; pRCA stent ~70% ISR, mRCA 90% & 60%, rPDA 99% --> CABG  . 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 Maren BeachVanTrigt -- LIMA-LAD, SVG-OM 1, SVG-RPDA  . Ischemic cardiomyopathy  November 2014    EF by LV gram to 20%; by echocardiogram 30 and 35%  . Diabetes mellitus type 2 in obese     On oral medications  . Hypertension, essential, benign   . Hyperlipidemia LDL goal <70   . Obesity, Class II, BMI 35-39.9   . Obstructive sleep apnea     Not currently using CPAP due to intolerance    Medications:  Scheduled:  . aspirin EC  81 mg Oral Daily  . clopidogrel  75 mg Oral Q breakfast  .  furosemide  40 mg Intravenous Q12H  . metoprolol  50 mg Oral BID  . ondansetron (ZOFRAN) IV  4 mg Intravenous Once  . pantoprazole  40 mg Oral Daily  . ramipril  10 mg Oral Daily  . simvastatin  40 mg Oral Daily  . sodium chloride  3 mL Intravenous Q12H    Assessment: 73 yo M admitted with recurrent substernal chest pain. Troponin initially elevated. Pharmacy has been consulted to initiate a heparin drip. As patient has been inpatient and receiving prophylactic lovenox (last dose yesterday at ~1600), will not give bolus.   No bleeding issues noted. Hgb 1/31 13.8. Platelet count 195.  Heparin dosing weight: 85.8 kg  Goal of Therapy:  Heparin level 0.3-0.7 units/ml Monitor platelets by anticoagulation protocol: Yes   Plan:  -Initiate heparin 1100 units/hr (~13 units/kg/hr) -check HL in 8 hours -daily heparin level -f/u bleeding complications  Karis Emig C. Lizbet Cirrincione, PharmD Clinical Pharmacist-Resident Pager: [phone] Pharmacy: [phone] 04/12/2013 11:40 AM

## 2013-04-12 NOTE — Progress Notes (Signed)
Central Telemetry notified me that pt. Had a 4 beat run of V-Tach. I checked pt. Who was now back to Normal Sinus Rhythm at 61 BPM. Pt. Denied any chest pain or discomfort during the time of the V-Tach episode. I notified MD of episode and bed now available for pt. Pt. Transferred to Step down unit.

## 2013-04-13 ENCOUNTER — Encounter (HOSPITAL_COMMUNITY)
Admission: EM | Disposition: A | Discharge status: Home / Self Care | Payer: Self-pay | Source: Home / Self Care | Attending: Cardiology

## 2013-04-13 DIAGNOSIS — I739 Peripheral vascular disease, unspecified: Secondary | ICD-10-CM

## 2013-04-13 DIAGNOSIS — I25810 Atherosclerosis of coronary artery bypass graft(s) without angina pectoris: Secondary | ICD-10-CM

## 2013-04-13 DIAGNOSIS — I2510 Atherosclerotic heart disease of native coronary artery without angina pectoris: Secondary | ICD-10-CM

## 2013-04-13 DIAGNOSIS — I214 Non-ST elevation (NSTEMI) myocardial infarction: Secondary | ICD-10-CM

## 2013-04-13 DIAGNOSIS — I2589 Other forms of chronic ischemic heart disease: Secondary | ICD-10-CM

## 2013-04-13 DIAGNOSIS — I10 Essential (primary) hypertension: Secondary | ICD-10-CM

## 2013-04-13 HISTORY — PX: CARDIAC CATHETERIZATION: SHX172

## 2013-04-13 HISTORY — DX: Atherosclerosis of coronary artery bypass graft(s) without angina pectoris: I25.810

## 2013-04-13 HISTORY — PX: LEFT HEART CATHETERIZATION WITH CORONARY/GRAFT ANGIOGRAM: SHX5450

## 2013-04-13 LAB — CBC
HCT: 39.1 % (ref 39.0–52.0)
HEMOGLOBIN: 13.1 g/dL (ref 13.0–17.0)
MCH: 27.3 pg (ref 26.0–34.0)
MCHC: 33.5 g/dL (ref 30.0–36.0)
MCV: 81.6 fL (ref 78.0–100.0)
Platelets: 174 10*3/uL (ref 150–400)
RBC: 4.79 MIL/uL (ref 4.22–5.81)
RDW: 14.4 % (ref 11.5–15.5)
WBC: 3.7 10*3/uL — ABNORMAL LOW (ref 4.0–10.5)

## 2013-04-13 LAB — GLUCOSE, CAPILLARY
Glucose-Capillary: 133 mg/dL — ABNORMAL HIGH (ref 70–99)
Glucose-Capillary: 108 mg/dL — ABNORMAL HIGH (ref 70–99)

## 2013-04-13 LAB — BASIC METABOLIC PANEL
BUN: 19 mg/dL (ref 6–23)
CHLORIDE: 97 meq/L (ref 96–112)
CO2: 26 meq/L (ref 19–32)
Calcium: 8.8 mg/dL (ref 8.4–10.5)
Creatinine, Ser: 0.96 mg/dL (ref 0.50–1.35)
GFR calc Af Amer: >90 mL/min (ref >90)
GFR calc non Af Amer: 81 mL/min — ABNORMAL LOW (ref >90)
Glucose, Bld: 142 mg/dL — ABNORMAL HIGH (ref 70–99)
POTASSIUM: 3.8 meq/L (ref 3.7–5.3)
SODIUM: 139 meq/L (ref 137–147)

## 2013-04-13 LAB — HEPARIN LEVEL (UNFRACTIONATED): Heparin Unfractionated: 0.39 IU/mL (ref 0.30–0.70)

## 2013-04-13 LAB — TROPONIN I: Troponin I: 0.91 ng/mL (ref <0.30)

## 2013-04-13 SURGERY — LEFT HEART CATHETERIZATION WITH CORONARY/GRAFT ANGIOGRAM
Anesthesia: LOCAL

## 2013-04-13 MED ORDER — SODIUM CHLORIDE 0.9 % IV SOLN: 250.0000 mL | INTRAVENOUS | Status: DC | PRN

## 2013-04-13 MED ORDER — LIDOCAINE HCL (PF) 1 % IJ SOLN
INTRAMUSCULAR | Status: AC
  Filled 2013-04-13: qty 30

## 2013-04-13 MED ORDER — MIDAZOLAM HCL 2 MG/2ML IJ SOLN
INTRAMUSCULAR | Status: AC
Start: 2013-04-13 — End: 2013-04-13
  Filled 2013-04-13: qty 2

## 2013-04-13 MED ORDER — SODIUM CHLORIDE 0.9 % IJ SOLN
3.0000 mL | Freq: Two times a day (BID) | INTRAMUSCULAR | Status: DC
  Administered 2013-04-13: 3 mL via INTRAVENOUS

## 2013-04-13 MED ORDER — ASPIRIN 81 MG PO CHEW
81.0000 mg | CHEWABLE_TABLET | ORAL | Status: DC
  Filled 2013-04-13: qty 1

## 2013-04-13 MED ORDER — SODIUM CHLORIDE 0.9 % IJ SOLN
3.0000 mL | Freq: Two times a day (BID) | INTRAMUSCULAR | Status: DC
  Administered 2013-04-14 (×2): 3 mL via INTRAVENOUS
  Administered 2013-04-15: 11:00:00 via INTRAVENOUS

## 2013-04-13 MED ORDER — SODIUM CHLORIDE 0.9 % IV SOLN
INTRAVENOUS | Status: DC
Start: 2013-04-14 — End: 2013-04-14

## 2013-04-13 MED ORDER — SODIUM CHLORIDE 0.9 % IJ SOLN
3.0000 mL | INTRAMUSCULAR | Status: DC | PRN
Start: 2013-04-13 — End: 2013-04-13

## 2013-04-13 MED ORDER — HEPARIN (PORCINE) IN NACL 100-0.45 UNIT/ML-% IJ SOLN
1300.0000 [IU]/h | INTRAMUSCULAR | Status: DC
  Administered 2013-04-13: 1300 [IU]/h via INTRAVENOUS
  Filled 2013-04-13: qty 250

## 2013-04-13 MED ORDER — SODIUM CHLORIDE 0.9 % IV SOLN
250.0000 mL | INTRAVENOUS | Status: DC | PRN
Start: 2013-04-13 — End: 2013-04-15

## 2013-04-13 MED ORDER — SODIUM CHLORIDE 0.9 % IV SOLN
1.0000 mL/kg/h | INTRAVENOUS | Status: DC
Start: 2013-04-14 — End: 2013-04-13
  Administered 2013-04-13: 1 mL/kg/h via INTRAVENOUS

## 2013-04-13 MED ORDER — FENTANYL CITRATE 0.05 MG/ML IJ SOLN
INTRAMUSCULAR | Status: AC
  Filled 2013-04-13: qty 2

## 2013-04-13 MED ORDER — SODIUM CHLORIDE 0.9 % IV SOLN: 1.0000 mL/kg/h | INTRAVENOUS | Status: AC

## 2013-04-13 MED ORDER — SODIUM CHLORIDE 0.9 % IJ SOLN: 3.0000 mL | INTRAMUSCULAR | Status: DC | PRN

## 2013-04-13 MED ORDER — NITROGLYCERIN 0.2 MG/ML ON CALL CATH LAB
INTRAVENOUS | Status: AC
  Filled 2013-04-13: qty 1

## 2013-04-13 MED ORDER — VERAPAMIL HCL 2.5 MG/ML IV SOLN
INTRAVENOUS | Status: AC
  Filled 2013-04-13: qty 2

## 2013-04-13 MED ORDER — HEPARIN SODIUM (PORCINE) 1000 UNIT/ML IJ SOLN
INTRAMUSCULAR | Status: AC
  Filled 2013-04-13: qty 1

## 2013-04-13 MED ORDER — DIAZEPAM 5 MG PO TABS: 5.0000 mg | ORAL_TABLET | ORAL | Status: DC

## 2013-04-13 MED ORDER — SODIUM CHLORIDE 0.9 % IJ SOLN
3.0000 mL | Freq: Two times a day (BID) | INTRAMUSCULAR | Status: DC
  Administered 2013-04-13 – 2013-04-14 (×3): 3 mL via INTRAVENOUS

## 2013-04-13 MED ORDER — HEPARIN (PORCINE) IN NACL 2-0.9 UNIT/ML-% IJ SOLN
INTRAMUSCULAR | Status: AC
  Filled 2013-04-13: qty 1000

## 2013-04-13 NOTE — Interval H&P Note (Signed)
History and Physical Interval Note:  04/13/2013 1:43 PM  Earl Williamson  has presented today for surgery, with the diagnosis of cp  The various methods of treatment have been discussed with the patient and family. After consideration of risks, benefits and other options for treatment, the patient has consented to  Procedure(s): LEFT HEART CATHETERIZATION WITH CORONARY/GRAFT ANGIOGRAM (N/A) as a surgical intervention .  The patient's history has been reviewed, patient examined, no change in status, stable for surgery.  I have reviewed the patient's chart and labs.  Questions were answered to the patient's satisfaction.    Cath Lab Visit (complete for each Cath Lab visit)  Clinical Evaluation Leading to the Procedure:   ACS: yes  Non-ACS:    Anginal Classification: CCS IV  Anti-ischemic medical therapy: Minimal Therapy (1 class of medications)  Non-Invasive Test Results: No non-invasive testing performed  Prior CABG: No previous CABG       Earl Williamson

## 2013-04-13 NOTE — CV Procedure (Signed)
    Cardiac Catheterization Procedure Note  Name: Earl RocherRaymond Kage MRN: [id number] DOB: August 16, 1940  Procedure: Left Heart Cath, Selective Coronary Angiography, LV angiography, SVG angiography, LIMA angiography  Indication: NSTEMI   Procedural Details: The left wrist was prepped, draped, and anesthetized with 1% lidocaine. Using the modified Seldinger technique, a 5/6 French sheath was introduced into the left radial artery. 3 mg of verapamil was administered through the sheath, weight-based unfractionated heparin was administered intravenously. Standard Judkins catheters were used for selective coronary angiography, SVG angiography, LIMA angiography, and left ventriculography. A JR-4 was used for the LIMA graft and an AL-1 was used for SVG angiography and native RCA angiography. Catheter exchanges were performed over an exchange length guidewire. There were no immediate procedural complications. A TR band was used for radial hemostasis at the completion of the procedure.  The patient was transferred to the post catheterization recovery area for further monitoring.  Procedural Findings: Hemodynamics: AO 109/65 LV 107/16  Coronary angiography: Coronary dominance: right  Left mainstem: Patent without obstructive disease.  Left anterior descending (LAD): 100% occlusion at the ostium. There is a stent in the mid-LAD proximal to the LIMA insertion site  Left circumflex (LCx): Mild nonobstructive proximal stenosis. The first OM is small with 80% stenosis. The second OM is grafted with 95% where the vessel is 'tacked up' at the graft insertion site. The AV circumflex in the mid-portion is patent with a previously implanted stent.  Right coronary artery (RCA): Dominant vessel, mild diffuse in-stent restenosis. At the distal stent edge there is 80% calcific stenosis. The mid-vessel has diffuse stenosis approximately 60-70%. The distal RCA has 95% stenosis and the PDA has 95% tandem lesions.  SVG - OM:  total occlusion at the aortic anastomosis  SVG - PDA: total occlusion at the aortic anastomosis  LIMA-LAD: patent with 90% stenosis at the LAD insertion site. The LAD has diffuse moderate stenosis until it occludes at the apical portion.  Left ventriculography: Severe LV dysfunction. Akinesis of the entire inferior wall and hypokinesis of the anterior and anterolateral walls. The LVEF is estimated at 25%  Abdominal aortogram: Patent aorta and iliacs/femorals bilaterally  Final Conclusions:   1. Severe native 3 vessel CAD  2. S/p CABG with total occlusion of the SVG-OM and SVG-PDA and severe stenosis of the LIMA insertion site 3. Severe LV dysfunction 4. Patent abdominal aorta and iliac arteries  Recommendations: Tough situation. Not a candidate for redo CABG (see Dr Zenaida NieceVan Trigt's op note). PCI will be high-risk in setting of severe multivessel disease and severe LV dysfunction, especially with heavy calcification and need for rotational atherectomy. Best option is probably rotational atherectomy and PCI of the RCA, PCI of the LIMA-LAD anastomosis, with hemodynamic support (Impella). Will discuss with patient and family and tentatively plan on doing tomorrow.  Tonny BollmanMichael Baljit Liebert 04/13/2013, 3:05 PM

## 2013-04-13 NOTE — Progress Notes (Signed)
Post-Cath note:  I reviewed films with Dr Herbie BaltimoreHarding and Dr Clifton JamesMcAlhany. Discussed options with patient and his family. Plan to proceed with Impella-supported rotational atherectomy of the RCA and PTCA/stenting of the LIMA-LAD anastomosis. Reviewed risks, indications, and alternatives with them. Estimate a 10% risk of periprocedural MI and up to 5% mortality risk considering his severe LV dysfunction. They understand and agree to proceed.  Tonny BollmanMichael Mlissa Tamayo 04/13/2013 4:55 PM

## 2013-04-13 NOTE — Progress Notes (Signed)
ANTICOAGULATION CONSULT NOTE - Follow Up Consult  Pharmacy Consult for heparin Indication: chest pain/ACS  Labs:  Recent Labs  04/10/13 2112  04/11/13 0434 04/12/13 0415 04/12/13 1225 04/12/13 1645 04/12/13 1955 04/12/13 2250 04/13/13 0414  HGB 13.3  --  13.8  --   --   --   --   --  13.1  HCT 39.7  --  41.2  --   --   --   --   --  39.1  PLT 181  --  195  --   --   --   --   --  174  APTT 61*  --   --   --   --   --   --   --   --   LABPROT 12.9  --   --   --   --   --   --   --   --   INR 0.99  --   --   --   --   --   --   --   --   HEPARINUNFRC  --   --   --   --   --   --  0.27*  --  0.39  CREATININE 1.16  --  0.98 1.07  --   --   --   --   --   TROPONINI  --   < >  --   --  2.16* 1.96*  --  2.05*  --   < > = values in this interval not displayed.   Assessment/Plan:  73yo male now therapeutic on heparin after rate increases.  Will continue gtt at current rate and confirm stable with additional level; cath scheduled for this afternoon.  Vernard GamblesVeronda Sadako Cegielski, PharmD, BCPS  04/13/2013,5:17 AM

## 2013-04-13 NOTE — Care Management Note (Addendum)
    Page 1 of 1   04/16/2013     2:37:04 PM   CARE MANAGEMENT NOTE 04/16/2013  Patient:  Earl Williamson,Earl Williamson   Account Number:  [id number]  Date Initiated:  04/13/2013  Documentation initiated by:  Junius CreamerWELL,DEBBIE  Subjective/Objective Assessment:   adm w mi     Action/Plan:   lives w wife, pcp dr Hal Neerjafed masond   Anticipated DC Date:     Anticipated DC Plan:        DC Planning Services  CM consult  Medication Assistance      Choice offered to / List presented to:             Status of service:   Medicare Important Message given?   (If response is "NO", the following Medicare IM given date fields will be blank) Date Medicare IM given:   Date Additional Medicare IM given:    Discharge Disposition:  HOME/SELF CARE  Per UR Regulation:  Reviewed for med. necessity/level of care/duration of stay  If discussed at Long Length of Stay Meetings, dates discussed:   04/16/2013    Comments:  04-16-13 [address] Graves-Bigelow, RN,BSN [phone]  MRI confirmed new acute infarcts . CM was called by Dennis BastKelly Lanier with [employer] given decreased EF and pt has been approved.  Unsure when pt will be ready for d/c. Please have CM to call Tresa EndoKelly @ [phone] to fit pt when medically stable. CM to f/u.    2/4 0954 debbie dowell rn,bsn pt has brilinta 30day free and copay assist card. has 40.00 per month copay for brilinta.

## 2013-04-13 NOTE — Progress Notes (Signed)
ANTICOAGULATION CONSULT NOTE - Follow Up Consult  Pharmacy Consult for heparin Indication: chest pain/ACS  Labs:  Recent Labs  04/10/13 2112  04/11/13 0434 04/12/13 0415  04/12/13 1645 04/12/13 1955 04/12/13 2250 04/13/13 0414 04/13/13 0741  HGB 13.3  --  13.8  --   --   --   --   --  13.1  --   HCT 39.7  --  41.2  --   --   --   --   --  39.1  --   PLT 181  --  195  --   --   --   --   --  174  --   APTT 61*  --   --   --   --   --   --   --   --   --   LABPROT 12.9  --   --   --   --   --   --   --   --   --   INR 0.99  --   --   --   --   --   --   --   --   --   HEPARINUNFRC  --   --   --   --   --   --  0.27*  --  0.39  --   CREATININE 1.16  --  0.98 1.07  --   --   --   --  0.96  --   TROPONINI  --   < >  --   --   < > 1.96*  --  2.05*  --  0.91*  < > = values in this interval not displayed.   Assessment/Plan:  73yo male admitted 04/10/2013 with CP.  Pharmacy consulted to dose heparin and now to resume it pending staged PCI intervention.    Heparin previously therapeutic at 1300 units/hr.  H/H stable no bleeding noted.  TR band applied ~1430.    1. Resume heparin at 2230 at 1300 units/hr 2. Daily heparin level and CBC  Thank you for allowing pharmacy to be a part of this patients care team.  Lovenia KimJulie Janari Yamada Pharm.D., BCPS Clinical Pharmacist 04/13/2013 7:31 PM Pager: [phone] Phone: [phone]

## 2013-04-13 NOTE — Progress Notes (Signed)
SUBJECTIVE:  Had recurrent chest pain yesterday evening and was transferred to stepdown.  No further CP since last PM  OBJECTIVE:   Vitals:   Filed Vitals:   04/12/13 1548 04/12/13 2012 04/13/13 0014 04/13/13 0443  BP: 118/81 121/72 124/72 112/84  Pulse: 63 63 62 65  Temp: 97.9 F (36.6 C) 97.8 F (36.6 C) 98 F (36.7 C) 98.1 F (36.7 C)  TempSrc: Oral Oral Axillary Oral  Resp: 18 19 15 18   Height:      Weight:    223 lb 8.7 oz (101.4 kg)  SpO2: 97% 98%  100%   I&O's:   Intake/Output Summary (Last 24 hours) at 04/13/13 0732 Last data filed at 04/13/13 0500  Gross per 24 hour  Intake 763.26 ml  Output   1525 ml  Net -761.74 ml   TELEMETRY: Reviewed telemetry pt in NSR:     PHYSICAL EXAM General: Well developed, well nourished, in no acute distress Head: Eyes PERRLA, No xanthomas.   Normal cephalic and atramatic  Lungs:   Clear bilaterally to auscultation and percussion. Heart:   HRRR S1 S2 Pulses are 2+ & equal. Abdomen: Bowel sounds are positive, abdomen soft and non-tender without masses  Extremities:   No clubbing, cyanosis or edema.  DP +1 Neuro: Alert and oriented X 3. Psych:  Good affect, responds appropriately   LABS: Basic Metabolic Panel:  Recent Labs  16/10/96 0434 04/12/13 0415 04/13/13 0414  NA  --  145 139  K  --  4.0 3.8  CL  --  103 97  CO2  --  28 26  GLUCOSE  --  112* 142*  BUN  --  19 19  CREATININE 0.98 1.07 0.96  CALCIUM  --  9.0 8.8  MG 1.7  --   --    Liver Function Tests:  Recent Labs  04/10/13 2112  AST 28  ALT 42  ALKPHOS 55  BILITOT 0.6  PROT 7.2  ALBUMIN 3.7   No results found for this basename: LIPASE, AMYLASE,  in the last 72 hours CBC:  Recent Labs  04/11/13 0434 04/13/13 0414  WBC 4.3 3.7*  HGB 13.8 13.1  HCT 41.2 39.1  MCV 81.7 81.6  PLT 195 174   Cardiac Enzymes:  Recent Labs  04/12/13 1225 04/12/13 1645 04/12/13 2250  TROPONINI 2.16* 1.96* 2.05*   BNP: No components found with this  basename: POCBNP,  D-Dimer: No results found for this basename: DDIMER,  in the last 72 hours Hemoglobin A1C: No results found for this basename: HGBA1C,  in the last 72 hours Fasting Lipid Panel: No results found for this basename: CHOL, HDL, LDLCALC, TRIG, CHOLHDL, LDLDIRECT,  in the last 72 hours Thyroid Function Tests:  Recent Labs  04/11/13 0434  TSH 0.751   Anemia Panel: No results found for this basename: VITAMINB12, FOLATE, FERRITIN, TIBC, IRON, RETICCTPCT,  in the last 72 hours Coag Panel:   Lab Results  Component Value Date   INR 0.99 04/10/2013   INR 1.34 01/30/2013   INR 1.09 01/26/2013    RADIOLOGY: Dg Chest 2 View  03/18/2013   CLINICAL DATA:  Chest pain and shortness of breath. Previous myocardial infarct and coronary bypass grafting.  EXAM: CHEST  2 VIEW  COMPARISON:  02/25/2013  FINDINGS: Moderate cardiomegaly is stable. Both lungs are clear. No evidence of pneumothorax or pleural effusion. Prior CABG again noted.  IMPRESSION: Moderate cardiomegaly.  No active lung disease.   Electronically Signed   By:  Myles RosenthalJohn  Stahl M.D.   On: 03/18/2013 16:40   Dg Chest Portable 1 View  04/10/2013   CLINICAL DATA:  Sternotomy for CABG 01/30/2013. Diaphoresis. Lightheaded. Chest pain. Left upper extremity tingling.  EXAM: PORTABLE CHEST - 1 VIEW  COMPARISON:  DG CHEST 2 VIEW dated 03/18/2013; DG CHEST 2 VIEW dated 02/25/2013; DG CHEST 2 VIEW dated 02/04/2013; DG CHEST 2 VIEW dated 02/03/2013; DG CHEST 1V PORT dated 01/30/2013; DG CHEST 2 VIEW dated 01/25/2013  FINDINGS: Sternotomy for CABG. Cardiac silhouette moderately enlarged but stable. Interval development of mild diffuse interstitial pulmonary edema as evidenced by Charyl DancerKerley being Kerley C lines. No confluent airspace consolidation. No visible pleural effusions.  IMPRESSION: Mild CHF suspected, with stable moderate cardiomegaly and mild diffuse interstitial pulmonary edema.   Electronically Signed   By: Hulan Saashomas  Lawrence M.D.   On:  04/10/2013 21:30    Assessment:   1. Recurrent chest pain, initial troponin I 0.58 and now up to 2.05 last PM.  Transferred to stepdown after developing recurrent pain yesterday, somewhat more tense and better with nitroglycerin. ECG showed concerning changes for inferior injury current with symptoms. Early graft failure should be considered.  2. 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  3. Hypertension, blood pressure stable.  4. Ischemic cardiomyopathy, LVEF 30-35% as of November 2014.  5. Possible component of acute on chronic systolic heart failure.  Plan/Discussion:   Continue heparin infusion as well as nitroglycerin drip, ASA,Plavix, beta blocker, ACE I and statin Further cycling of cardiac markers until they peak Diagnostic cardiac catheterization today to assess for early graft failure      Quintella ReichertURNER,Vennie Waymire R, MD  04/13/2013  7:32 AM

## 2013-04-13 NOTE — H&P (View-Only) (Signed)
SUBJECTIVE:  Had recurrent chest pain yesterday evening and was transferred to stepdown.  No further CP since last PM  OBJECTIVE:   Vitals:   Filed Vitals:   04/12/13 1548 04/12/13 2012 04/13/13 0014 04/13/13 0443  BP: 118/81 121/72 124/72 112/84  Pulse: 63 63 62 65  Temp: 97.9 F (36.6 C) 97.8 F (36.6 C) 98 F (36.7 C) 98.1 F (36.7 C)  TempSrc: Oral Oral Axillary Oral  Resp: 18 19 15 18   Height:      Weight:    223 lb 8.7 oz (101.4 kg)  SpO2: 97% 98%  100%   I&O's:   Intake/Output Summary (Last 24 hours) at 04/13/13 0732 Last data filed at 04/13/13 0500  Gross per 24 hour  Intake 763.26 ml  Output   1525 ml  Net -761.74 ml   TELEMETRY: Reviewed telemetry pt in NSR:     PHYSICAL EXAM General: Well developed, well nourished, in no acute distress Head: Eyes PERRLA, No xanthomas.   Normal cephalic and atramatic  Lungs:   Clear bilaterally to auscultation and percussion. Heart:   HRRR S1 S2 Pulses are 2+ & equal. Abdomen: Bowel sounds are positive, abdomen soft and non-tender without masses  Extremities:   No clubbing, cyanosis or edema.  DP +1 Neuro: Alert and oriented X 3. Psych:  Good affect, responds appropriately   LABS: Basic Metabolic Panel:  Recent Labs  16/10/96 0434 04/12/13 0415 04/13/13 0414  NA  --  145 139  K  --  4.0 3.8  CL  --  103 97  CO2  --  28 26  GLUCOSE  --  112* 142*  BUN  --  19 19  CREATININE 0.98 1.07 0.96  CALCIUM  --  9.0 8.8  MG 1.7  --   --    Liver Function Tests:  Recent Labs  04/10/13 2112  AST 28  ALT 42  ALKPHOS 55  BILITOT 0.6  PROT 7.2  ALBUMIN 3.7   No results found for this basename: LIPASE, AMYLASE,  in the last 72 hours CBC:  Recent Labs  04/11/13 0434 04/13/13 0414  WBC 4.3 3.7*  HGB 13.8 13.1  HCT 41.2 39.1  MCV 81.7 81.6  PLT 195 174   Cardiac Enzymes:  Recent Labs  04/12/13 1225 04/12/13 1645 04/12/13 2250  TROPONINI 2.16* 1.96* 2.05*   BNP: No components found with this  basename: POCBNP,  D-Dimer: No results found for this basename: DDIMER,  in the last 72 hours Hemoglobin A1C: No results found for this basename: HGBA1C,  in the last 72 hours Fasting Lipid Panel: No results found for this basename: CHOL, HDL, LDLCALC, TRIG, CHOLHDL, LDLDIRECT,  in the last 72 hours Thyroid Function Tests:  Recent Labs  04/11/13 0434  TSH 0.751   Anemia Panel: No results found for this basename: VITAMINB12, FOLATE, FERRITIN, TIBC, IRON, RETICCTPCT,  in the last 72 hours Coag Panel:   Lab Results  Component Value Date   INR 0.99 04/10/2013   INR 1.34 01/30/2013   INR 1.09 01/26/2013    RADIOLOGY: Dg Chest 2 View  03/18/2013   CLINICAL DATA:  Chest pain and shortness of breath. Previous myocardial infarct and coronary bypass grafting.  EXAM: CHEST  2 VIEW  COMPARISON:  02/25/2013  FINDINGS: Moderate cardiomegaly is stable. Both lungs are clear. No evidence of pneumothorax or pleural effusion. Prior CABG again noted.  IMPRESSION: Moderate cardiomegaly.  No active lung disease.   Electronically Signed   By:  Myles RosenthalJohn  Stahl M.D.   On: 03/18/2013 16:40   Dg Chest Portable 1 View  04/10/2013   CLINICAL DATA:  Sternotomy for CABG 01/30/2013. Diaphoresis. Lightheaded. Chest pain. Left upper extremity tingling.  EXAM: PORTABLE CHEST - 1 VIEW  COMPARISON:  DG CHEST 2 VIEW dated 03/18/2013; DG CHEST 2 VIEW dated 02/25/2013; DG CHEST 2 VIEW dated 02/04/2013; DG CHEST 2 VIEW dated 02/03/2013; DG CHEST 1V PORT dated 01/30/2013; DG CHEST 2 VIEW dated 01/25/2013  FINDINGS: Sternotomy for CABG. Cardiac silhouette moderately enlarged but stable. Interval development of mild diffuse interstitial pulmonary edema as evidenced by Charyl DancerKerley being Kerley C lines. No confluent airspace consolidation. No visible pleural effusions.  IMPRESSION: Mild CHF suspected, with stable moderate cardiomegaly and mild diffuse interstitial pulmonary edema.   Electronically Signed   By: Hulan Saashomas  Lawrence M.D.   On:  04/10/2013 21:30    Assessment:   1. Recurrent chest pain, initial troponin I 0.58 and now up to 2.05 last PM.  Transferred to stepdown after developing recurrent pain yesterday, somewhat more tense and better with nitroglycerin. ECG showed concerning changes for inferior injury current with symptoms. Early graft failure should be considered.  2. 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  3. Hypertension, blood pressure stable.  4. Ischemic cardiomyopathy, LVEF 30-35% as of November 2014.  5. Possible component of acute on chronic systolic heart failure.  Plan/Discussion:   Continue heparin infusion as well as nitroglycerin drip, ASA,Plavix, beta blocker, ACE I and statin Further cycling of cardiac markers until they peak Diagnostic cardiac catheterization today to assess for early graft failure      Quintella ReichertURNER,Shayden Gingrich R, MD  04/13/2013  7:32 AM

## 2013-04-14 ENCOUNTER — Encounter (HOSPITAL_COMMUNITY)
Admission: EM | Disposition: A | Discharge status: Home / Self Care | Payer: Medicare Other | Source: Home / Self Care | Attending: Cardiology

## 2013-04-14 DIAGNOSIS — G4733 Obstructive sleep apnea (adult) (pediatric): Secondary | ICD-10-CM

## 2013-04-14 DIAGNOSIS — I25810 Atherosclerosis of coronary artery bypass graft(s) without angina pectoris: Secondary | ICD-10-CM

## 2013-04-14 DIAGNOSIS — I214 Non-ST elevation (NSTEMI) myocardial infarction: Secondary | ICD-10-CM

## 2013-04-14 DIAGNOSIS — I2510 Atherosclerotic heart disease of native coronary artery without angina pectoris: Secondary | ICD-10-CM

## 2013-04-14 DIAGNOSIS — E785 Hyperlipidemia, unspecified: Secondary | ICD-10-CM

## 2013-04-14 DIAGNOSIS — Z955 Presence of coronary angioplasty implant and graft: Secondary | ICD-10-CM

## 2013-04-14 HISTORY — PX: PERCUTANEOUS CORONARY STENT INTERVENTION WITH IMPELLA (PCI-SI): SHX6017

## 2013-04-14 HISTORY — DX: Presence of coronary angioplasty implant and graft: Z95.5

## 2013-04-14 LAB — BASIC METABOLIC PANEL
BUN: 18 mg/dL (ref 6–23)
CALCIUM: 8.8 mg/dL (ref 8.4–10.5)
CO2: 26 mEq/L (ref 19–32)
Chloride: 101 mEq/L (ref 96–112)
Creatinine, Ser: 0.97 mg/dL (ref 0.50–1.35)
GFR calc Af Amer: >90 mL/min (ref >90)
GFR, EST NON AFRICAN AMERICAN: 81 mL/min — AB (ref >90)
Glucose, Bld: 147 mg/dL — ABNORMAL HIGH (ref 70–99)
POTASSIUM: 3.5 meq/L — AB (ref 3.7–5.3)
Sodium: 143 mEq/L (ref 137–147)

## 2013-04-14 LAB — POCT ACTIVATED CLOTTING TIME: Activated Clotting Time: 443 seconds

## 2013-04-14 LAB — CBC
HCT: 38.1 % — ABNORMAL LOW (ref 39.0–52.0)
Hemoglobin: 12.7 g/dL — ABNORMAL LOW (ref 13.0–17.0)
MCH: 27.3 pg (ref 26.0–34.0)
MCHC: 33.3 g/dL (ref 30.0–36.0)
MCV: 81.9 fL (ref 78.0–100.0)
Platelets: 182 10*3/uL (ref 150–400)
RBC: 4.65 MIL/uL (ref 4.22–5.81)
RDW: 14.6 % (ref 11.5–15.5)
WBC: 4.2 10*3/uL (ref 4.0–10.5)

## 2013-04-14 LAB — PLATELET INHIBITION P2Y12: PLATELET FUNCTION P2Y12: 324 [PRU] (ref 194–418)

## 2013-04-14 LAB — HEPARIN LEVEL (UNFRACTIONATED)
Heparin Unfractionated: 0.17 IU/mL — ABNORMAL LOW (ref 0.30–0.70)
Heparin Unfractionated: 0.57 IU/mL (ref 0.30–0.70)

## 2013-04-14 SURGERY — PERCUTANEOUS CORONARY STENT INTERVENTION WITH IMPELLA (PCI-SI)
Anesthesia: LOCAL

## 2013-04-14 MED ORDER — FENTANYL CITRATE 0.05 MG/ML IJ SOLN
INTRAMUSCULAR | Status: AC
  Filled 2013-04-14: qty 2

## 2013-04-14 MED ORDER — LIDOCAINE HCL (PF) 1 % IJ SOLN
INTRAMUSCULAR | Status: AC
  Filled 2013-04-14: qty 30

## 2013-04-14 MED ORDER — SODIUM CHLORIDE 0.9 % IJ SOLN
3.0000 mL | Freq: Two times a day (BID) | INTRAMUSCULAR | Status: DC
  Administered 2013-04-14: 3 mL via INTRAVENOUS
  Administered 2013-04-15: 11:00:00 via INTRAVENOUS

## 2013-04-14 MED ORDER — TICAGRELOR 90 MG PO TABS
180.0000 mg | ORAL_TABLET | ORAL | Status: AC
  Administered 2013-04-14: 180 mg via ORAL
  Filled 2013-04-14: qty 2

## 2013-04-14 MED ORDER — NITROGLYCERIN 0.2 MG/ML ON CALL CATH LAB
INTRAVENOUS | Status: AC
  Filled 2013-04-14: qty 1

## 2013-04-14 MED ORDER — ATROPINE SULFATE 0.1 MG/ML IJ SOLN
INTRAMUSCULAR | Status: AC
  Filled 2013-04-14: qty 10

## 2013-04-14 MED ORDER — SODIUM CHLORIDE 0.9 % IV SOLN
0.2500 mg/kg/h | INTRAVENOUS | Status: DC
  Filled 2013-04-14: qty 250

## 2013-04-14 MED ORDER — ONDANSETRON HCL 4 MG/2ML IJ SOLN
INTRAMUSCULAR | Status: AC
  Filled 2013-04-14: qty 2

## 2013-04-14 MED ORDER — SODIUM CHLORIDE 0.9 % IV SOLN: 1.0000 mL/kg/h | INTRAVENOUS | Status: AC

## 2013-04-14 MED ORDER — MIDAZOLAM HCL 2 MG/2ML IJ SOLN
INTRAMUSCULAR | Status: AC
  Filled 2013-04-14: qty 2

## 2013-04-14 MED ORDER — SODIUM CHLORIDE 0.9 % IV SOLN: 250.0000 mL | INTRAVENOUS | Status: DC | PRN

## 2013-04-14 MED ORDER — VERAPAMIL HCL 2.5 MG/ML IV SOLN
INTRAVENOUS | Status: AC
  Filled 2013-04-14: qty 4

## 2013-04-14 MED ORDER — SODIUM CHLORIDE 0.9 % IJ SOLN: 3.0000 mL | INTRAMUSCULAR | Status: DC | PRN

## 2013-04-14 MED ORDER — LIDOCAINE-EPINEPHRINE 1 %-1:100000 IJ SOLN
INTRAMUSCULAR | Status: AC
  Filled 2013-04-14: qty 1

## 2013-04-14 MED ORDER — HEPARIN SODIUM (PORCINE) 1000 UNIT/ML IJ SOLN
INTRAMUSCULAR | Status: AC
  Filled 2013-04-14: qty 1

## 2013-04-14 MED ORDER — TICAGRELOR 90 MG PO TABS
90.0000 mg | ORAL_TABLET | Freq: Two times a day (BID) | ORAL | Status: DC
  Administered 2013-04-14 – 2013-04-17 (×6): 90 mg via ORAL
  Filled 2013-04-14 (×7): qty 1

## 2013-04-14 MED ORDER — HEPARIN (PORCINE) IN NACL 2-0.9 UNIT/ML-% IJ SOLN
INTRAMUSCULAR | Status: AC
  Filled 2013-04-14: qty 1500

## 2013-04-14 MED ORDER — BIVALIRUDIN 250 MG IV SOLR
INTRAVENOUS | Status: AC
  Filled 2013-04-14: qty 250

## 2013-04-14 MED ORDER — FUROSEMIDE 80 MG PO TABS
80.0000 mg | ORAL_TABLET | Freq: Two times a day (BID) | ORAL | Status: DC
  Administered 2013-04-15 – 2013-04-17 (×6): 80 mg via ORAL
  Filled 2013-04-14: qty 2
  Filled 2013-04-14 (×8): qty 1

## 2013-04-14 NOTE — Progress Notes (Signed)
Subjective:  No major issues overnight. A bit anxious about upcoming procedure.  Objective:  Vital Signs in the last 24 hours: Temp:  [97.5 F (36.4 C)-98.4 F (36.9 C)] 97.6 F (36.4 C) (02/03 0751) Pulse Rate:  [56-69] 68 (02/03 0751) Resp:  [0-28] 14 (02/03 0751) BP: (83-137)/(47-72) 112/72 mmHg (02/03 0751) SpO2:  [92 %-100 %] 97 % (02/03 0751) Weight:  [219 lb 12.8 oz (99.7 kg)] 219 lb 12.8 oz (99.7 kg) (02/03 0600)  Intake/Output from previous day: 02/02 0701 - 02/03 0700 In: 836.4 [I.V.:836.4] Out: 2700 [Urine:2700] Intake/Output from this shift: Total I/O In: 489 [P.O.:200; I.V.:289] Out: 700 [Urine:700]  Physical Exam: General: Well developed, well nourished, in no acute distress  Head: Eyes PERRLA, No xanthomas. Normal cephalic and atramatic  Lungs: Clear bilaterally to auscultation and percussion.  Heart: RRR S1 S2 (distal) Pulses are 2+ & equal.  Abdomen: Bowel sounds are positive, abdomen soft and non-tender without masses ; mildly obese Extremities: No clubbing, cyanosis or edema. DP +1  Neuro: Alert and oriented X 3.  Psych: Good affect, responds appropriately   Lab Results:  Recent Labs  04/13/13 0414 04/14/13 0250  WBC 3.7* 4.2  HGB 13.1 12.7*  PLT 174 182    Recent Labs  04/13/13 0414 04/14/13 0250  NA 139 143  K 3.8 3.5*  CL 97 101  CO2 26 26  GLUCOSE 142* 147*  BUN 19 18  CREATININE 0.96 0.97    Recent Labs  04/12/13 2250 04/13/13 0741  TROPONINI 2.05* 0.91*   Hepatic Function Panel No results found for this basename: PROT, ALBUMIN, AST, ALT, ALKPHOS, BILITOT, BILIDIR, IBILI,  in the last 72 hours No results found for this basename: CHOL,  in the last 72 hours No results found for this basename: PROTIME,  in the last 72 hours  Cardiac Studies: I personally reviewed the cath films with Dr. Excell Seltzer (occluded native LAD with 90% anastomotic insertion lesion from the LIMA-LAD; occlusion of SVG-OM and SVG-PDA; native circumflex  graft it OM1 has 95% lesion the first OM having 80% stenosis a small vessel; diffuse disease in the RCA with 80% calcified proximal and distal 95%.  Assessment/Plan:  Principal Problem:   NSTEMI (non-ST elevated myocardial infarction) Active Problems:   CAD S/P percutaneous coronary angioplasty - PCI  x 3 stents @ [facility]; CABG x 3 01/30/13    Cardiomyopathy, ischemic, EF 30-35%, 01/27/13 - now roughly 20% by LV gram February 2013   HTN (hypertension), benign   HLD (hyperlipidemia)   DM type 2 (diabetes mellitus, type 2)   Obstructive sleep apnea   Chest pain   Acute on chronic heart failure  With severe residual CAD and essentially failed CABG, having failure of all 3 grafts, we had a long discussion today after that with Dr. Excell Seltzer yesterday about plan to do Impella supported high-risk PCI with possible Rotablator on the RCA followed by possible PCI on the LIMA-LAD anastomosis.  This will be a high-risk procedure and I spent at least 25 minutes of talking with the patient and family about the procedure and risks entailed. He understands the risk as does his granddaughter who hasn't some medical training his own to proceed on with the understanding that this is high-risk but without, his quality of life is likely to be quite poor.  Were checked to be to a 12 in addition assay which did not show adequate platelet inhibition with Plavix, therefore we will load with Brilinta in addition to aspirin.  He is currently on IV heparin and nitroglycerin until his cath.  He remains on stable dose of beta blocker and ACE inhibitor as well as statin. He is protected from a GI standpoint with PPI.  He has worsening of his ischemic cardiomyopathy, and will be on a higher dose of Lasix at discharge. We hope that with this a high-risk intervention his EF may improve some. However, I suspect that he will likely need evaluation for ICD. His previous EF was 3035% and now reduced. I will discuss with electrophysiology  reference timing of ICD placement since we are now close to 90 days out from initial infarct. Would likely not be this admission.   Performing MD:  Tonny BollmanMichael Cooper, M.D.  Procedure:  Impella supported high-risk PCI on native RCA with possible Rotablator placement and temporary pacer placement, followed by PCI of LIMA-LAD  The procedure with Risks/Benefits/Alternatives and Indications was reviewed with the patient and granddaughter.  All questions were answered.    Risks / Complications include, but not limited to: Death, MI, CVA/TIA, VF/VT (with defibrillation), Bradycardia (need for temporary pacer placement), contrast induced nephropathy, bleeding / bruising / hematoma / pseudoaneurysm, vascular or coronary injury (with possible emergent CT or Vascular Surgery), adverse medication reactions, infection.  The patient does understand this is a very high-risk procedure based on his low EF, and relatively minimal native circulation.  The patient (and family) voice understanding and agree to proceed.      LOS: 4 days    Nikola Marone W 04/14/2013, 11:29 AM

## 2013-04-14 NOTE — H&P (View-Only) (Signed)
Subjective:  No major issues overnight. A bit anxious about upcoming procedure.  Objective:  Vital Signs in the last 24 hours: Temp:  [97.5 F (36.4 C)-98.4 F (36.9 C)] 97.6 F (36.4 C) (02/03 0751) Pulse Rate:  [56-69] 68 (02/03 0751) Resp:  [0-28] 14 (02/03 0751) BP: (83-137)/(47-72) 112/72 mmHg (02/03 0751) SpO2:  [92 %-100 %] 97 % (02/03 0751) Weight:  [219 lb 12.8 oz (99.7 kg)] 219 lb 12.8 oz (99.7 kg) (02/03 0600)  Intake/Output from previous day: 02/02 0701 - 02/03 0700 In: 836.4 [I.V.:836.4] Out: 2700 [Urine:2700] Intake/Output from this shift: Total I/O In: 489 [P.O.:200; I.V.:289] Out: 700 [Urine:700]  Physical Exam: General: Well developed, well nourished, in no acute distress  Head: Eyes PERRLA, No xanthomas. Normal cephalic and atramatic  Lungs: Clear bilaterally to auscultation and percussion.  Heart: RRR S1 S2 (distal) Pulses are 2+ & equal.  Abdomen: Bowel sounds are positive, abdomen soft and non-tender without masses ; mildly obese Extremities: No clubbing, cyanosis or edema. DP +1  Neuro: Alert and oriented X 3.  Psych: Good affect, responds appropriately   Lab Results:  Recent Labs  04/13/13 0414 04/14/13 0250  WBC 3.7* 4.2  HGB 13.1 12.7*  PLT 174 182    Recent Labs  04/13/13 0414 04/14/13 0250  NA 139 143  K 3.8 3.5*  CL 97 101  CO2 26 26  GLUCOSE 142* 147*  BUN 19 18  CREATININE 0.96 0.97    Recent Labs  04/12/13 2250 04/13/13 0741  TROPONINI 2.05* 0.91*   Hepatic Function Panel No results found for this basename: PROT, ALBUMIN, AST, ALT, ALKPHOS, BILITOT, BILIDIR, IBILI,  in the last 72 hours No results found for this basename: CHOL,  in the last 72 hours No results found for this basename: PROTIME,  in the last 72 hours  Cardiac Studies: I personally reviewed the cath films with Dr. Excell Seltzer (occluded native LAD with 90% anastomotic insertion lesion from the LIMA-LAD; occlusion of SVG-OM and SVG-PDA; native circumflex  graft it OM1 has 95% lesion the first OM having 80% stenosis a small vessel; diffuse disease in the RCA with 80% calcified proximal and distal 95%.  Assessment/Plan:  Principal Problem:   NSTEMI (non-ST elevated myocardial infarction) Active Problems:   CAD S/P percutaneous coronary angioplasty - PCI  x 3 stents @ [facility]; CABG x 3 01/30/13    Cardiomyopathy, ischemic, EF 30-35%, 01/27/13 - now roughly 20% by LV gram February 2013   HTN (hypertension), benign   HLD (hyperlipidemia)   DM type 2 (diabetes mellitus, type 2)   Obstructive sleep apnea   Chest pain   Acute on chronic heart failure  With severe residual CAD and essentially failed CABG, having failure of all 3 grafts, we had a long discussion today after that with Dr. Excell Seltzer yesterday about plan to do Impella supported high-risk PCI with possible Rotablator on the RCA followed by possible PCI on the LIMA-LAD anastomosis.  This will be a high-risk procedure and I spent at least 25 minutes of talking with the patient and family about the procedure and risks entailed. He understands the risk as does his granddaughter who hasn't some medical training his own to proceed on with the understanding that this is high-risk but without, his quality of life is likely to be quite poor.  Were checked to be to a 12 in addition assay which did not show adequate platelet inhibition with Plavix, therefore we will load with Brilinta in addition to aspirin.  He is currently on IV heparin and nitroglycerin until his cath.  He remains on stable dose of beta blocker and ACE inhibitor as well as statin. He is protected from a GI standpoint with PPI.  He has worsening of his ischemic cardiomyopathy, and will be on a higher dose of Lasix at discharge. We hope that with this a high-risk intervention his EF may improve some. However, I suspect that he will likely need evaluation for ICD. His previous EF was 3035% and now reduced. I will discuss with electrophysiology  reference timing of ICD placement since we are now close to 90 days out from initial infarct. Would likely not be this admission.   Performing MD:  Tonny BollmanMichael Cooper, M.D.  Procedure:  Impella supported high-risk PCI on native RCA with possible Rotablator placement and temporary pacer placement, followed by PCI of LIMA-LAD  The procedure with Risks/Benefits/Alternatives and Indications was reviewed with the patient and granddaughter.  All questions were answered.    Risks / Complications include, but not limited to: Death, MI, CVA/TIA, VF/VT (with defibrillation), Bradycardia (need for temporary pacer placement), contrast induced nephropathy, bleeding / bruising / hematoma / pseudoaneurysm, vascular or coronary injury (with possible emergent CT or Vascular Surgery), adverse medication reactions, infection.  The patient does understand this is a very high-risk procedure based on his low EF, and relatively minimal native circulation.  The patient (and family) voice understanding and agree to proceed.      LOS: 4 days    Cletus Paris W 04/14/2013, 11:29 AM

## 2013-04-14 NOTE — Telephone Encounter (Signed)
[facility]

## 2013-04-14 NOTE — Progress Notes (Signed)
Brilinta booklet with 30 day free card,  and heart attack education  booklet  given to pt and family.

## 2013-04-14 NOTE — Progress Notes (Signed)
ANTICOAGULATION CONSULT NOTE - Follow Up Consult  Pharmacy Consult for heparin Indication: chest pain/ACS  No Known Allergies  Patient Measurements: Height: 5\' 6"  (167.6 cm) Weight: 219 lb 12.8 oz (99.7 kg) IBW/kg (Calculated) : 63.8 Heparin Dosing Weight: 86kg  Vital Signs: Temp: 97.6 F (36.4 C) (02/03 0751) Temp src: Oral (02/03 0751) BP: 112/72 mmHg (02/03 0751) Pulse Rate: 68 (02/03 0751)  Labs:  Recent Labs  04/12/13 0415  04/12/13 1645  04/12/13 2250 04/13/13 0414 04/13/13 0741 04/14/13 0250 04/14/13 0852  HGB  --   --   --   --   --  13.1  --  12.7*  --   HCT  --   --   --   --   --  39.1  --  38.1*  --   PLT  --   --   --   --   --  174  --  182  --   HEPARINUNFRC  --   --   --   < >  --  0.39  --  0.17* 0.57  CREATININE 1.07  --   --   --   --  0.96  --  0.97  --   TROPONINI  --   < > 1.96*  --  2.05*  --  0.91*  --   --   < > = values in this interval not displayed.  Estimated Creatinine Clearance: 77.3 ml/min (by C-G formula based on Cr of 0.97).   Medications:  Scheduled:  . aspirin EC  81 mg Oral Daily  . clopidogrel  75 mg Oral Q breakfast  . diazepam  5 mg Oral On Call  . furosemide  40 mg Intravenous Q12H  . metoprolol  50 mg Oral BID  . ondansetron (ZOFRAN) IV  4 mg Intravenous Once  . pantoprazole  40 mg Oral Daily  . ramipril  10 mg Oral Daily  . simvastatin  40 mg Oral Daily  . sodium chloride  3 mL Intravenous Q12H  . sodium chloride  3 mL Intravenous Q12H    Assessment: 73 yo male with NSTEMI s/p cath noted with 3V CAD and graft occlusion. Patient on heparin at 1300 units/hr and noted at goal (HL= 0.57). Patient noted for PCI today with impella supported rotational atherectomy.   Goal of Therapy:  Heparin level 0.3-0.7 units/ml Monitor platelets by anticoagulation protocol: Yes   Plan:  -No heparin changes needed -Will follow post cath  Harland GermanAndrew Roc Streett, Pharm D 04/14/2013 9:34 AM

## 2013-04-14 NOTE — Progress Notes (Signed)
Not coming back. Belongings taken to the new room.

## 2013-04-14 NOTE — Progress Notes (Signed)
Transferred to the cath lab by bed. 

## 2013-04-14 NOTE — CV Procedure (Signed)
CARDIAC CATH NOTE  Name: Earl Williamson MRN: [id number] DOB: 20-Aug-1940  Procedure: Impella hemodynamic support device insertion, temporary transvenous pacemaker insertion under fluoroscopic guidance, PTCA and stenting of the LIMA-LAD, and rotational atherectomy/PTCA and stenting of the mid-RCA, and PTCA and stenting of the PDA, Perclose of the bilateral femoral arteries  Indication: NSTEMI, severe multivessel CAD, acute on chronic systolic heart failure with LVEF less than 30%. 73 year-old gentleman after recent CABG underwent cardiac cath yesterday demonstrating occlusion of SVG's and severe stenosis of LIMA-LAD insertion site. Reviewed options with patient and family and elected to do high-risk PCI today with hemodynamic support.  Procedural Details: The right groin was prepped, draped, and anesthetized with 1% lidocaine. Using a micropuncture technique, the right femoral artery was accessed. 2 Perclose devices were deployed and a 14 [place] sheath was introduced. Using the modified Seldinger technique, a 7 Fr sheath was introduced into the left femoral artery.  Using the modified Seldinger technique, a 6 French sheath was introduced into the left femoral vein. Weight-based bivalirudin was given for anticoagulation. Plans were to treat tight stenosis at the LIMA to LAD insertion site and to use rotational atherectomy to treat severe calcific disease of the native right coronary artery. Attention was first turned to the LIMA intervention. Once a therapeutic ACT was achieved, a 6 [place] LIMA guide catheter was inserted.  A cougar coronary guidewire was used to cross the lesion.  The lesion was predilated with a 2.5 x 12 mm balloon.  The lesion was then stented with a 2.5 x 18 mm Xience Alpine drug-eluting stent.  The stent was postdilated with a 2.75 mm noncompliant balloon to 16 atm.  Following PCI, there was 0% residual stenosis and TIMI-3 flow. Final angiography confirmed an excellent result.    Attention was then turned to the right coronary artery. A 7 [place] AL-1 guide catheter was utilized. There is severe disease in the mid vessel with heavy calcification. There is also severe diffuse disease of the PDA. Rotational atherectomy was performed with a 1.5 mm burr used in the mid RCA. This debulked the lesion well. There was slow flow after rotational atherectomy, but the patient was not symptomatic. The Rotafloppy wire was changed out for a 300 cm cougar wire over a 2.5 mm balloon. The lesions in the mid and distal vessel were dilated with a 2.5 x 12 mm balloon. I attempted to pass a 2.25 x 32 mm Promus premier DES in the PDA but it would not cross despite redilatation in that area. Ultimately I treated the PDA lesion focally with a 2.5 x 12 mm Promus premier DES. That stent was postdilated with a 2.75 mm noncompliant balloon to 16 atmospheres. The mid lesion was then treated with a 3.0 by 47m Promus premier DES which was deployed at 18 atmospheres. That was postdilated with a 3.5 x 12 mm [place] balloon to 22 atmospheres. Finally, a 2.0 x 20 mm compliant balloon was passed into the PDA for balloon angioplasty. Prolonged inflation was done to 8 atmospheres. The patient tolerated the procedure well. There were no immediate procedural complications. Femoral hemostasis was achieved with Perclose devices on both sides. The venous sheath will be pulled in 2 hours after stopping Angiomax.. The patient was transferred to the post catheterization recovery area for further monitoring.  Lesion Data: Lesion 1: Vessel: LAD/mid (LIMA insertion site) Percent stenosis (pre): 90 TIMI-flow (pre):  3 Stent:  2.5 x 12 mm Xience Alpine DES Percent stenosis (post): 0 TIMI-flow (  post): 3  Lesion 2: Vessel: RCA/Mid Percent stenosis (pre): 80 TIMI-flow (pre):  3 Stent:  3.0x16 mm Promus DES Percent stenosis (post): 0 TIMI-flow (post): 3  Lesion 3: Vessel: PDA Percent stenosis (pre): 95 TIMI-flow (pre):   3 Stent:  2.5x12 mm Promus DES Percent stenosis (post): 0 TIMI-flow (post): 3  Conclusions: Successful complex PCI of the LIMA to LAD insertion site, mid RCA, and PDA with hemodynamic support and using an Impella device.  Recommendations: DAPT with ASA and Brilinta 12 months favor long-term  Sherren Mocha 04/14/2013, 5:02 PM

## 2013-04-14 NOTE — Interval H&P Note (Signed)
History and Physical Interval Note:  04/14/2013 1:50 PM  Earl Williamson  has presented today for surgery, with the diagnosis of CAD  The various methods of treatment have been discussed with the patient and family. After consideration of risks, benefits and other options for treatment, the patient has consented to  Procedure(s): PERCUTANEOUS CORONARY STENT INTERVENTION WITH IMPELLA (PCI-SI) (N/A) as a surgical intervention .  The patient's history has been reviewed, patient examined, no change in status, stable for surgery.  I have reviewed the patient's chart and labs.  Questions were answered to the patient's satisfaction.    Cath Lab Visit (complete for each Cath Lab visit)  Clinical Evaluation Leading to the Procedure:   ACS: yes  Non-ACS:    Anginal Classification: CCS IV  Anti-ischemic medical therapy: Maximal Therapy (2 or more classes of medications)  Non-Invasive Test Results: No non-invasive testing performed  Prior CABG: Previous CABG       Earl Williamson

## 2013-04-15 ENCOUNTER — Inpatient Hospital Stay (HOSPITAL_COMMUNITY): Payer: 59

## 2013-04-15 DIAGNOSIS — E119 Type 2 diabetes mellitus without complications: Secondary | ICD-10-CM

## 2013-04-15 DIAGNOSIS — Z9861 Coronary angioplasty status: Secondary | ICD-10-CM

## 2013-04-15 DIAGNOSIS — R4182 Altered mental status, unspecified: Secondary | ICD-10-CM

## 2013-04-15 HISTORY — PX: CORONARY ANGIOPLASTY WITH STENT PLACEMENT: SHX49

## 2013-04-15 HISTORY — DX: Altered mental status, unspecified: R41.82

## 2013-04-15 LAB — BASIC METABOLIC PANEL
BUN: 14 mg/dL (ref 6–23)
CHLORIDE: 102 meq/L (ref 96–112)
CO2: 25 meq/L (ref 19–32)
Calcium: 8.5 mg/dL (ref 8.4–10.5)
Creatinine, Ser: 0.95 mg/dL (ref 0.50–1.35)
GFR calc Af Amer: >90 mL/min (ref >90)
GFR calc non Af Amer: 82 mL/min — ABNORMAL LOW (ref >90)
Glucose, Bld: 138 mg/dL — ABNORMAL HIGH (ref 70–99)
Potassium: 3.6 mEq/L — ABNORMAL LOW (ref 3.7–5.3)
Sodium: 142 mEq/L (ref 137–147)

## 2013-04-15 LAB — URINALYSIS W MICROSCOPIC + REFLEX CULTURE
Bilirubin Urine: NEGATIVE
Glucose, UA: 250 mg/dL — AB
Hgb urine dipstick: NEGATIVE
Ketones, ur: NEGATIVE mg/dL
LEUKOCYTES UA: NEGATIVE
NITRITE: NEGATIVE
PH: 6 (ref 5.0–8.0)
Protein, ur: NEGATIVE mg/dL
SPECIFIC GRAVITY, URINE: 1.02 (ref 1.005–1.030)
Urobilinogen, UA: 0.2 mg/dL (ref 0.0–1.0)

## 2013-04-15 LAB — GLUCOSE, CAPILLARY
GLUCOSE-CAPILLARY: 110 mg/dL — AB (ref 70–99)
GLUCOSE-CAPILLARY: 156 mg/dL — AB (ref 70–99)
Glucose-Capillary: 163 mg/dL — ABNORMAL HIGH (ref 70–99)

## 2013-04-15 LAB — CBC
HEMATOCRIT: 38.5 % — AB (ref 39.0–52.0)
HEMOGLOBIN: 12.8 g/dL — AB (ref 13.0–17.0)
MCH: 27.2 pg (ref 26.0–34.0)
MCHC: 33.2 g/dL (ref 30.0–36.0)
MCV: 81.9 fL (ref 78.0–100.0)
Platelets: 172 10*3/uL (ref 150–400)
RBC: 4.7 MIL/uL (ref 4.22–5.81)
RDW: 14.4 % (ref 11.5–15.5)
WBC: 3.9 10*3/uL — AB (ref 4.0–10.5)

## 2013-04-15 MED ORDER — CARVEDILOL 6.25 MG PO TABS
6.2500 mg | ORAL_TABLET | Freq: Two times a day (BID) | ORAL | Status: DC
  Administered 2013-04-15: 6.25 mg via ORAL
  Filled 2013-04-15 (×3): qty 1

## 2013-04-15 MED ORDER — ZOLPIDEM TARTRATE 5 MG PO TABS: 5.0000 mg | ORAL_TABLET | Freq: Every evening | ORAL | Status: DC | PRN

## 2013-04-15 MED ORDER — CARVEDILOL 12.5 MG PO TABS
12.5000 mg | ORAL_TABLET | Freq: Two times a day (BID) | ORAL | Status: DC
Start: 2013-04-15 — End: 2013-04-17
  Administered 2013-04-15 – 2013-04-17 (×5): 12.5 mg via ORAL
  Filled 2013-04-15 (×6): qty 1

## 2013-04-15 MED FILL — Sodium Chloride IV Soln 0.9%: INTRAVENOUS | Qty: 50 | Status: AC

## 2013-04-15 NOTE — Progress Notes (Signed)
Subjective:  Pt underwent high-risk PCI with hemodynamic support by impella on 04/14/13 with temp pacer placement, PTCA and stent to LIMA-LAD, athrectomy/PTCA/stent to mid RCA, PTCA+stent PDA. No acute events overnight. VSS.  Pt had 4 beats NSVT and some PVCs overnight. Pt slept well, tolerated full diet overnight. Pt having no CP, SOB and was able to ambulate in room.   Objective:  Vital Signs in the last 24 hours: Temp:  [97.2 F (36.2 C)-97.8 F (36.6 C)] 97.8 F (36.6 C) (02/04 0330) Pulse Rate:  [59-86] 62 (02/04 0400) Resp:  [10-22] 10 (02/03 2000) BP: (99-167)/(52-86) 129/69 mmHg (02/04 0400) SpO2:  [94 %-100 %] 100 % (02/04 0400) Weight:  [224 lb (101.606 kg)] 224 lb (101.606 kg) (02/04 0342)  Intake/Output from previous day: 02/03 0701 - 02/04 0700 In: 1282.4 [P.O.:200; I.V.:1082.4] Out: 1600 [Urine:1600] Intake/Output from this shift: Total I/O In: 703.9 [I.V.:703.9] Out: 600 [Urine:600]  Physical Exam: General: resting in bed, NAD HEENT: PERRL, EOMI, no scleral icterus Cardiac: distant HS, RRR, no rubs, murmurs or gallops Pulm: clear to auscultation bilaterally, no crackles, wheezes, or rhonchi, moving normal volumes of air Abd: soft, nontender, nondistended, BS present Ext: warm and well perfused, no pedal edema, bilateral femoral sites no bruits, 2+ pulses bilaterally Neuro: alert and oriented X3, cranial nerves II-XII grossly intact  Lab Results:  Recent Labs  04/14/13 0250 04/15/13 0228  WBC 4.2 3.9*  HGB 12.7* 12.8*  PLT 182 172    Recent Labs  04/14/13 0250 04/15/13 0228  NA 143 142  K 3.5* 3.6*  CL 101 102  CO2 26 25  GLUCOSE 147* 138*  BUN 18 14  CREATININE 0.97 0.95    Recent Labs  04/12/13 2250 04/13/13 0741  TROPONINI 2.05* 0.91*   Cardiac Studies: Tele overnight: pt with 4 beast NSVT and some PVCs  Assessment/Plan:  Principal Problem:   NSTEMI (non-ST elevated myocardial infarction) Active Problems:   CAD S/P percutaneous  coronary angioplasty - PCI  x 3 stents @ [facility]; CABG x 3 01/30/13    DM type 2 (diabetes mellitus, type 2)   HTN (hypertension), benign   HLD (hyperlipidemia)   Cardiomyopathy, ischemic, EF 30-35%, 01/27/13 - now roughly 20% by LV gram February 2013   Obstructive sleep apnea   Chest pain   Acute on chronic heart failure  1. NSTEMI 2/2 failed CABG grafts:  Pt tolerated PCI on 04/14/13. VSS and no arrhthymias overnight. Pt up ambulating. No CP.  -repeat ECHO on 04/16/13 -cardiac rehab -DAPT with ASA+ Brilinta for 12 months -can transfer to stepdown  2. Ischemic cardiomyopathy with systolic heart failure: pt having worsening systolic function EF currently 31%.  -cont  ramipril 10mg , and simvastatin 40mg  -will d/c metoprolol 50 bd and start carvidolol 12.5 mg BID (rough equivalent and pt was tolerating metoprolol previously) and then may need to up titrate 9.75mg  on 04/16/13 -lasix 80 mg bid may need to increase upon discharge if pt becomes volume overloaded -ICD evaluation at discharge    LOS: 5 days   Christen BameSadek, Chanae Gemma 04/15/2013, 6:44 AM

## 2013-04-15 NOTE — Progress Notes (Signed)
CARDIAC REHAB PHASE I   PRE:  Rate/Rhythm: 78 SR    BP: sitting 106/62    SaO2: 98 RA  MODE:  Ambulation: 250 ft   POST:  Rate/Rhythm: 95 SR    BP: sitting 149/61     SaO2: 95 RA  Pt stated his groins were sore sitting on EOB. Able to walk steadily once up, pushing w/c for support. Stated his groins actually relaxed with walking. To recliner after walk. Denied CP. Discussed MI, stents, restrictions, Brilinta. Pt and wife voice that he has been working on his diet and that his CBGs have been around 90 at home. To f/u with PCP soon. Still interested in CRPII and will update [place] and they will call him. Will f/u tomorrow. Pt would like to walk more today. [phone]  Elissa LovettReeve, Earl Williamson FriedensKristan CES, ACSM 04/15/2013 10:37 AM

## 2013-04-15 NOTE — Progress Notes (Signed)
S:  Called by RN pt is altered.  Per family patient was eating lunch then said his abdomen was hurting and has not been acting right since.  Pt is mumbling and not able to verbalize clearly then patient thinking more clear.     O:  98.7, HR 78, BP 107/57 (71), 100% RA cbg 156.  Physical exam  General: sitting in recliner  Neuro: pt is not following commands, alert but only [facility]   A/P 1. Acute encephalopathy -Code stroke called. R/o stroke vs delirium.  Of note Pt is s/p cath 2/3  -CT head portable and EKG stat to r/o stroke. Echo pending.  Will check UA  -Dr. Herbie BaltimoreHarding and code stroke called  -Aspirin 81 already given today  2. Abdominal pain -will check CMET and lipase in the am, UA now   Shirlee LatchMcLean MD

## 2013-04-15 NOTE — Code Documentation (Signed)
73yo male with acute onset slurred speech, headache, dizziness, visual disturbance and right arm ataxia while sitting in the chair.  LKW 1310 per bedside RN.  Patient has an extensive health history and is s/p heart cath on 04/14/13.  Code stroke called.  Stroke team to bedside.  Patient taken to CT and CT read by Dr. Roseanne RenoStewart.  NIHSS 0, patient's symptoms have now cleared.  Patient made a TIA alert.  Reactivate Code Stroke should symptoms return.  No acute stroke treatment at this time.  Bedside handoff with Kenney Housemananya, RN.  See code stroke log for documentation and times.

## 2013-04-15 NOTE — Code Documentation (Signed)
Code Stroke called at 1332.  Patient last known well at 1310.  While sitting up in the chair patient felt as if everything was going dark, he had slurred speech and difficulty finding his words.  BP 124/74 Hr 86.  Staff assisted patient to bed.  Slurred speech resolved patient without complaint.  Transported to radiology for head CT.  Dr Herbie BaltimoreHarding and Dr Roseanne RenoStewart at bedside.  NIHSS 0.  RN to call if patient becomes symptomatic again.

## 2013-04-15 NOTE — Progress Notes (Addendum)
I have seen and evaluated the patient this AM along with Dr. Burtis JunesSadek. I agree with her findings, examination as well as impression recommendations as discussed on rounds.  This patient is well-known to me,, and I personally observed the cardiac catheterization procedure yesterday. It was very pleased with the results and discussed it with Dr. Excell Seltzerooper -- Impella protected PCI of LIMA-LAD and 2 sites in the RCA.  He did well overnight, but did have at least a 4 beat run of SVT with PVCs last night. No further chest pain, but did have some intermittent gasping dyspnea. He was able to cannulate in the hall this morning without any significant symptoms. He does have bilateral groin soreness but no bruits. No hematomas noted.  His exam is relatively benign.  Plan:  NSTEMI CAD with PCI as noted; Switched to Brilinta, wonder some of this may be having could be related to that. We may consider switching or Effient if this persists.  Ischemic cardiomyopathy: - Reassess EF by echocardiogram tomorrow. Plan to order LifeVest for discharge. Will recheck echo in 3 months for likely need for ICD.  No active heart failure symptoms. Continue current dose of ACE inhibitor and statin. I will switch from metoprolol to carvedilol for better CHF treatment.   Cardiac rehabilitation upon discharge. Excellent glycemic control, and not on insulin. He was on an glimepiride at home. We'll need to restart on discharge. We'll also want to make sure that he is converted back to his home ACE inhibitor (Ramipril); continue current dose of by mouth Lasix which has had the numbness.  OSA - will try CPAP at night, perhaps that could explain the dyspnea last night  Transferred to step down today, and consider possible discharge as early as tomorrow depending on how he is doing.   Marykay LexHARDING,Hagan Vanauken W, M.D., M.S. [facility] [address] [place], [place]  [id number]  [phone] Pager #  [phone] 04/15/2013 12:33 PM

## 2013-04-15 NOTE — Consult Note (Signed)
Referring Physician: Dr. Herbie BaltimoreHarding    Chief Complaint: Altered mental status with slurred speech  HPI: Earl Williamson is an 73 y.o. male history of ischemic cardiomyopathy, diabetes mellitus, hypertension, hyperlipidemia, non-STEMI in November 2014, and obstructive sleep apnea, who underwent high-risk PCI with hemodynamics support by Impella on 04/14/2013. Patient did well following the procedure. He was also noted to do well this morning including cardiac rehabilitation session. At 1310 today patient began feeling lightheaded and warm and sweaty in vision became dim.he was noted to be confused and speech was markedly slurred. Code stroke was activated. Patient's mental status began to improve after several minutes. Speech returned to normal. CT scan of his head was obtained which showed no acute intracranial abnormality. NIH stroke score was 0. Patient was started on Brilinta today for antiplatelet therapy.   LSN: [id number] on 03/15/2013  tPA Given: No: Deficits resolved  MRankin: 0   Past Medical History  Diagnosis Date  . ST elevation myocardial infarction (STEMI) of inferoposterior wall, subsequent episode of care 2005    [facility]) - 100% RCA -- DES; also mCx & LAD lesions  . CAD S/P percutaneous coronary angioplasty 2005    [facility] -- DES to pRCA (culprit for STEMI) - staged DES to mCx & mLAD  . Non-ST elevation myocardial infarction (NSTEMI), subsequent episode of care 01/2013    MultiVessel CAD --> CABG  . CAD in native artery 01/2013    Cath: 50% LAD ISR with 95% post-stent; prox OM1 95%; pRCA stent ~70% ISR, mRCA 90% & 60%, rPDA 99% --> CABG  . 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 Maren BeachVanTrigt -- LIMA-LAD, SVG-OM 1, SVG-RPDA  . Ischemic cardiomyopathy  November 2014    EF by LV gram to 20%; by echocardiogram 30 and 35%  . Diabetes mellitus type 2 in obese     On oral medications  . Hypertension, essential, benign   . Hyperlipidemia LDL goal <70   . Obesity, Class II, BMI 35-39.9   . Obstructive sleep  apnea     Not currently using CPAP due to intolerance    Family History  Problem Relation Age of Onset  . Heart attack Mother   . Heart attack Sister   . Heart attack Brother      Medications: I have reviewed the patient's current medications.  ROS: History obtained from chart review and the patient  General ROS: negative for - chills, fatigue, fever, night sweats, weight gain or weight loss Psychological ROS: negative for - behavioral disorder, hallucinations, memory difficulties, mood swings or suicidal ideation Ophthalmic ROS: negative for - blurry vision, double vision, eye pain or loss of vision ENT ROS: negative for - epistaxis, nasal discharge, oral lesions, sore throat, tinnitus or vertigo Allergy and Immunology ROS: negative for - hives or itchy/watery eyes Hematological and Lymphatic ROS: negative for - bleeding problems, bruising or swollen lymph nodes Endocrine ROS: negative for - galactorrhea, hair pattern changes, polydipsia/polyuria or temperature intolerance Respiratory ROS: Shortness of breath associated with cardiomyopathy; obstructive sleep apnea Cardiovascular ROS: Chronic ischemic cardiomyopathy Gastrointestinal ROS: negative for - abdominal pain, diarrhea, hematemesis, nausea/vomiting or stool incontinence Genito-Urinary ROS: negative for - dysuria, hematuria, incontinence or urinary frequency/urgency Musculoskeletal ROS: negative for - joint swelling or muscular weakness Neurological ROS: as noted in HPI Dermatological ROS: negative for rash and skin lesion changes  Physical Examination: Blood pressure 128/49, pulse 69, temperature 98.7 F (37.1 C), temperature source Oral, resp. rate 10, height 5\' 6"  (1.676 m), weight 101.606  kg (224 lb), SpO2 100.00%.  Neurologic Examination: Mental Status: Alert, oriented, thought content appropriate.  Speech fluent without evidence of aphasia. Able to follow commands without difficulty. Cranial Nerves: II-Visual  fields were normal. III/IV/VI-Pupils were equal and reacted. Extraocular movements were full and conjugate.    V/VII-no facial numbness and no facial weakness. VIII-normal. X-normal speech. Motor: 5/5 bilaterally with normal tone and bulk Sensory: Normal throughout. Deep Tendon Reflexes: 1+ and symmetric. Plantars: Mute bilaterally Cerebellar: Normal finger-to-nose testing.  Ct Head Wo Contrast  04/15/2013   CLINICAL DATA:  Code stroke.  Slurred speech.  Weakness.  EXAM: CT HEAD WITHOUT CONTRAST  TECHNIQUE: Contiguous axial images were obtained from the base of the skull through the vertex without intravenous contrast.  COMPARISON:  None.  FINDINGS: There is no evidence for acute hemorrhage, hydrocephalus, mass lesion, or abnormal extra-axial fluid collection. No definite CT evidence for acute infarction. Diffuse loss of parenchymal volume is consistent with atrophy.  The visualized paranasal sinuses and mastoid air cells are clear.  IMPRESSION: No CT evidence for acute intracranial abnormality.  I personally called these findings to Dr. Roseanne Reno at 1405 hr on 04/15/2013.   Electronically Signed   By: Kennith Center M.D.   On: 04/15/2013 14:05    Assessment: 73 y.o. male with multiple risk factors for stroke likely experienced presyncopal symptoms. TIA or possible small subcortical stroke cannot be ruled out at this point, however.   Stroke Risk Factors - diabetes mellitus, hyperlipidemia and hypertension  Plan: 1. HgbA1c, fasting lipid panel 2. MRI, MRA  of the brain without contrast 3. PT consult, OT consult, Speech consult 4. Carotid dopplers 5. Prophylactic therapy-Brilinta 6. Risk factor modification 7. Telemetry monitoring  C.R. Roseanne Reno, MD [facility] [phone]   04/15/2013, 2:36 PM

## 2013-04-15 NOTE — Progress Notes (Signed)
Code Stroke called @ 1335.  Last known normal @ 1310.  While sitting in cardiac recliner patient became lethargic, diaphoretic, dizzy, BUE weakness and developed slurred speech and visual changes ("things turned dark").  Wife at bedside at time.    Head CT performed.  Rapid Response at bedside.  Patient's speech now @ 1500 has returned to normal without and residual effect.  Drs. Roseanne RenoStewart and [place] aware of event and at bedside.

## 2013-04-16 ENCOUNTER — Encounter (HOSPITAL_COMMUNITY): Payer: Self-pay | Admitting: Cardiology

## 2013-04-16 DIAGNOSIS — I6350 Cerebral infarction due to unspecified occlusion or stenosis of unspecified cerebral artery: Secondary | ICD-10-CM

## 2013-04-16 DIAGNOSIS — I517 Cardiomegaly: Secondary | ICD-10-CM

## 2013-04-16 DIAGNOSIS — E669 Obesity, unspecified: Secondary | ICD-10-CM

## 2013-04-16 HISTORY — PX: TRANSTHORACIC ECHOCARDIOGRAM: SHX275

## 2013-04-16 LAB — COMPREHENSIVE METABOLIC PANEL
ALBUMIN: 3.7 g/dL (ref 3.5–5.2)
ALK PHOS: 56 U/L (ref 39–117)
ALT: 22 U/L (ref 0–53)
AST: 19 U/L (ref 0–37)
BILIRUBIN TOTAL: 1.1 mg/dL (ref 0.3–1.2)
BUN: 16 mg/dL (ref 6–23)
CHLORIDE: 98 meq/L (ref 96–112)
CO2: 26 mEq/L (ref 19–32)
CREATININE: 1.11 mg/dL (ref 0.50–1.35)
Calcium: 8.7 mg/dL (ref 8.4–10.5)
GFR calc Af Amer: 75 mL/min — ABNORMAL LOW (ref >90)
GFR calc non Af Amer: 65 mL/min — ABNORMAL LOW (ref >90)
Glucose, Bld: 196 mg/dL — ABNORMAL HIGH (ref 70–99)
POTASSIUM: 3.5 meq/L — AB (ref 3.7–5.3)
Sodium: 143 mEq/L (ref 137–147)
TOTAL PROTEIN: 7.3 g/dL (ref 6.0–8.3)

## 2013-04-16 LAB — CBC
HCT: 39.3 % (ref 39.0–52.0)
Hemoglobin: 13 g/dL (ref 13.0–17.0)
MCH: 27.3 pg (ref 26.0–34.0)
MCHC: 33.1 g/dL (ref 30.0–36.0)
MCV: 82.6 fL (ref 78.0–100.0)
PLATELETS: 168 10*3/uL (ref 150–400)
RBC: 4.76 MIL/uL (ref 4.22–5.81)
RDW: 14.5 % (ref 11.5–15.5)
WBC: 4.6 10*3/uL (ref 4.0–10.5)

## 2013-04-16 LAB — HEMOGLOBIN A1C
HEMOGLOBIN A1C: 7.2 % — AB (ref <5.7)
Mean Plasma Glucose: 160 mg/dL — ABNORMAL HIGH (ref <117)

## 2013-04-16 LAB — LIPID PANEL
Cholesterol: 140 mg/dL (ref 0–200)
HDL: 36 mg/dL — AB (ref >39)
LDL CALC: 74 mg/dL (ref 0–99)
TRIGLYCERIDES: 151 mg/dL — AB (ref <150)
Total CHOL/HDL Ratio: 3.9 RATIO
VLDL: 30 mg/dL (ref 0–40)

## 2013-04-16 LAB — LIPASE, BLOOD: Lipase: 32 U/L (ref 11–59)

## 2013-04-16 MED ORDER — STROKE: EARLY STAGES OF RECOVERY BOOK
Freq: Once | Status: AC
  Administered 2013-04-16: 11:00:00
  Filled 2013-04-16: qty 1

## 2013-04-16 NOTE — Progress Notes (Signed)
Subjective:  Pt underwent high-risk PCI with hemodynamic support by impella on 04/14/13. Code stroke called on 04/15/13 for slurred speech and lethargy. Pt had spontaneous return of baseline neurological function and status. Pt seen by neurology. MRI confirmed new acute infarcts likely 2/2 emboli.   Pt doing well this AM. Tolerated full diet and no return of deficits per family. Pt wants to ambulate. No cp, sob, LE edema.   Objective:  Vital Signs in the last 24 hours: Temp:  [98.1 F (36.7 C)-99.2 F (37.3 C)] 99.2 F (37.3 C) (02/05 0400) Pulse Rate:  [59-80] 66 (02/04 2024) BP: (99-149)/(41-76) 116/72 mmHg (02/05 0400) SpO2:  [96 %-100 %] 100 % (02/05 0400) Weight:  [222 lb 4.8 oz (100.835 kg)] 222 lb 4.8 oz (100.835 kg) (02/05 0400)  Intake/Output from previous day: 02/04 0701 - 02/05 0700 In: 543 [P.O.:540; I.V.:3] Out: 2600 [Urine:2600] Intake/Output from this shift: Total I/O In: 460 [P.O.:460] Out: 1625 [Urine:1625]  Physical Exam: General: resting in bed, NAD HEENT: PERRL, EOMI, no scleral icterus Cardiac: distant HS, RRR, no rubs, murmurs or gallops Pulm: clear to auscultation bilaterally, no crackles, wheezes, or rhonchi, moving normal volumes of air Abd: soft, nontender, nondistended, BS present Ext: warm and well perfused, no pedal edema, bilateral femoral sites no bruits, 2+ pulses bilaterally Neuro: alert and oriented X3, cranial nerves II-XII grossly intact  Lab Results:  Recent Labs  04/15/13 0228 04/16/13 0230  WBC 3.9* 4.6  HGB 12.8* 13.0  PLT 172 168    Recent Labs  04/15/13 0228 04/16/13 0230  NA 142 143  K 3.6* 3.5*  CL 102 98  CO2 25 26  GLUCOSE 138* 196*  BUN 14 16  CREATININE 0.95 1.11   Cardiac Studies: Tele overnight: some PVCs  EKG:   PCI 04/14/13: PTCA and stent to LIMA-LAD, athrectomy/PTCA/stent to mid RCA, PTCA+stent PDA.  MRI 04/15/13: Small areas of acute infarct in the cerebral hemispheres bilaterally, suggestive of cerebral  emboli.  Diffuse intracranial atherosclerotic disease. 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 This could be a chronic occlusion however cannot be dated with this study.  Assessment/Plan:  Principal Problem:   NSTEMI (non-ST elevated myocardial infarction) Active Problems:   CAD S/P percutaneous coronary angioplasty - PCI  x 3 stents @ [facility]; CABG x 3 01/30/13    DM type 2 (diabetes mellitus, type 2)   HTN (hypertension), benign   HLD (hyperlipidemia)   Cardiomyopathy, ischemic, EF 30-35%, 01/27/13 - now roughly 20% by LV gram February 2013   Obstructive sleep apnea   Chest pain   Acute on chronic heart failure   Altered mental status  1. NSTEMI 2/2 failed CABG grafts:  Pt tolerated PCI on 04/14/13. VSS and no arrhthymias overnight. Pt up ambulating. No CP.  -repeat ECHO on 04/16/13 -cardiac rehab -DAPT with ASA+ Brilinta for 12 months -can transfer to floor with anticipated d/c on 04/17/12  -need f/u with Dr. Herbie Baltimore at [place] office  2. Ischemic cardiomyopathy with systolic heart failure: pt having worsening systolic function EF currently 31%.  -cont  ramipril 10mg , and simvastatin 40mg  -if continues to have HTN may need to add bidel or increase carvedilol  -pt tolerating carvidolol 12.5 mg BID may increase if HR can tolerate on 04/17/13 -lasix 80 mg bid may need to increase upon discharge if pt becomes volume overloaded -ICD evaluation at discharge  -will need life vest given decreased EF  3. Acute ischemic stroke: MRI confirmed new scattered emboli not likely related to  recent cath given timeframe and pts symptoms that appear to be presyncopal in nature. No afib events on tele since admission therefore less likely to be etiology.  Pt has had full recovery from deficits and already on DAPT.  -risk stratification labs still pending -neurology following -PT/OT consult  4. OSA: pt with CPAP after procedure pt not tolerating at this time will need new settings.  -repeat sleep  study at discharge   LOS: 6 days   Christen BameSadek, Natthew Marlatt 04/16/2013, 6:48 AM

## 2013-04-16 NOTE — Progress Notes (Signed)
I have seen and evaluated the patient this AM along with Dr. Burtis JunesSadek. I agree with her findings, examination as well as impression recommendations as discussed on rounds.  This patient is well-known to me,, and I personally observed the cardiac catheterization procedure yesterday. It was very pleased with the results and discussed it with Dr. Excell Seltzerooper -- Impella protected PCI of LIMA-LAD and 2 sites in the RCA.  He did well overnight,no significant arrhythmias. He has what appeared to have been a vasovagal episode yesterday & Code Stroke was called -- CT Head no bleed --> MRI showed small focal defects (new) & occluded RMCA (old). After discussion with Dr. Pearlean BrownieSethi, he feels that the symptoms described yesterday are not related to the incidental findings on MRI.  Would not change overall Rx plan. Thankfully, he has no residual symptoms.  He is doing well today - Ambulated in hall without difficulty.  Needs Cardiac Rehabilitation on Discharge, We'll Likely [facility] up at [facility].  Tolerating the conversion of metoprolol to carvedilol. Basilar blood pressure heart rate room to continue to titrate as an outpatient. Would consider adding Bidil for additional afterload reduction prior to discharge. Continue current Lasix dose. Continue aspirin plus Brilinta, with recent stroke, would not be able to switch to Prasugrel. Hopefully he will become more accustomed to the mild gasping dyspnea with Brilinta. Anticipate possible discharge tomorrow, needs LifeVest prior to discharge. Will follow up with me in [place] office.  Needs to be evaluated for OSA-CPET settings as an outpatient. Perhaps this can be set up with pulmonary before his discharge.  Marykay LexHARDING,Cullan Launer W, MD

## 2013-04-16 NOTE — Progress Notes (Signed)
Patient refused cpap tonight. RT will continue to monitor. 

## 2013-04-16 NOTE — Progress Notes (Signed)
PT Cancellation Note  Patient Details Name: Earl RocherRaymond Williamson MRN: [id number] DOB: [birth year]   Cancelled Treatment:    Reason Eval/Treat Not Completed: Other (comment) (pt currently moving rooms and will attempt to see later as time allows)   Delorse Lekabor, Lun Muro Beth 04/16/2013, 1:29 PM Delaney MeigsMaija Tabor Kratos Ruscitti, PT [phone]

## 2013-04-16 NOTE — Evaluation (Addendum)
Physical Therapy Evaluation Patient Details Name: Earl Williamson MRN: [id number] DOB: 1940-11-06 Today's Date: 04/16/2013 Time: 4098-1191 PT Time Calculation (min): 21 min  PT Assessment / Plan / Recommendation History of Present Illness  Pt with CP and NSTEMI s/p CABG 01/2013. 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 Bil LE with 5/5 strength and normal sensation. bil UE grossly 3/5 limited from weakness post op CABG and no visual, balance or gait deficits and recommend continued cardiac rehab but no further physical therapy needs at this time. Pt educated for activity progression, need for ambulating 3x/day and progression from RW as pt has requested its use acutely until working with PT currently. Encouraged bil LE exercises to maintain strength as well.     PT Assessment  Patent does not need any further PT services    Follow Up Recommendations  Other (comment) (cardiac rehab)    Does the patient have the potential to tolerate intense rehabilitation      Barriers to Discharge        Equipment Recommendations  None recommended by PT    Recommendations for Other Services     Frequency      Precautions / Restrictions Precautions Precautions: Other (comment) Precaution Comments: MI   Pertinent Vitals/Pain No pain HR 85      Mobility  Bed Mobility Overal bed mobility: Modified Independent Transfers Overall transfer level: Modified independent Ambulation/Gait Ambulation/Gait assistance: Supervision Ambulation Distance (Feet): 200 Feet Assistive device: None Gait Pattern/deviations: WFL(Within Functional Limits) Gait velocity interpretation: at or above normal speed for age/gender Modified Rankin (Stroke Patients Only) Pre-Morbid Rankin Score: No symptoms Modified Rankin: No symptoms    Exercises     PT Diagnosis:    PT Problem List:   PT Treatment Interventions:       PT Goals(Current goals can be found in  the care plan section) Acute Rehab PT Goals PT Goal Formulation: No goals set, d/c therapy  Visit Information  Last PT Received On: 04/16/13 Assistance Needed: +1 Reason Eval/Treat Not Completed: Other (comment) (pt currently moving rooms and will attempt to see later as time allows) History of Present Illness: Pt with CP and NSTEMI s/p CABG 01/2013. 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       Prior Functioning  Home Living Family/patient expects to be discharged to:: Private residence Living Arrangements: Spouse/significant other Available Help at Discharge: Family;Available 24 hours/day Type of Home: House Home Access: Level entry Home Layout: One level Home Equipment: Walker - 2 wheels Prior Function Level of Independence: Independent Communication Communication: No difficulties    Cognition  Cognition Arousal/Alertness: Awake/alert Behavior During Therapy: WFL for tasks assessed/performed Overall Cognitive Status: Within Functional Limits for tasks assessed    Extremity/Trunk Assessment Upper Extremity Assessment Upper Extremity Assessment: Overall WFL for tasks assessed Lower Extremity Assessment Lower Extremity Assessment: Overall WFL for tasks assessed Cervical / Trunk Assessment Cervical / Trunk Assessment: Normal   Balance Balance Overall balance assessment: Modified Independent Sitting-balance support: No upper extremity supported Sitting balance-Leahy Scale: Normal Standing balance-Leahy Scale: Good Single Leg Stance - Right Leg: 3 Single Leg Stance - Left Leg: 3 Tandem Stance - Right Leg: 10 Tandem Stance - Left Leg: 15 Rhomberg - Eyes Opened: 60 Rhomberg - Eyes Closed: 20  End of Session PT - End of Session Equipment Utilized During Treatment: Gait belt Activity Tolerance: Patient tolerated treatment well Patient left: in bed;with call bell/phone within reach  GP  Toney Sangabor, Yetunde Leis Beth 04/16/2013, 3:02 PM  Delaney MeigsMaija Tabor Sandrina Heaton, PT [phone]

## 2013-04-16 NOTE — Progress Notes (Signed)
Stroke Team Progress Note  HISTORY Earl Williamson is an 73 y.o. male history of ischemic cardiomyopathy, diabetes mellitus, hypertension, hyperlipidemia, non-STEMI in November 2014, and obstructive sleep apnea, who underwent high-risk PCI with hemodynamics support by Impella on 04/14/2013. Patient did well following the procedure. He was also noted to do well this morning including cardiac rehabilitation session. At 1310 today 04/15/2013 patient began feeling lightheaded and warm and sweaty in vision became dim.he was noted to be confused and speech was markedly slurred. Code stroke was activated. Patient's mental status began to improve after several minutes. Speech returned to normal. CT scan of his head was obtained which showed no acute intracranial abnormality. NIH stroke score was 0. Patient was started on Brilinta today for antiplatelet therapy. Patient was not administerd TPA secondary to deficits resolving.   SUBJECTIVE His ?wife is at the bedside.  Overall he feels his condition is unchanged. Still back at baseline.  OBJECTIVE Most recent Vital Signs: Filed Vitals:   04/16/13 0000 04/16/13 0400 04/16/13 0801 04/16/13 0900  BP: 104/41 116/72 140/112 113/49  Pulse:   84 35  Temp: 98.4 F (36.9 C) 99.2 F (37.3 C) 98.9 F (37.2 C)   TempSrc: Oral Oral Oral   Resp:      Height:      Weight:  100.835 kg (222 lb 4.8 oz)    SpO2: 97% 100%  99%   CBG (last 3)   Recent Labs  04/15/13 0809 04/15/13 1202 04/15/13 1337  GLUCAP 110* 163* 156*    IV Fluid Intake:     MEDICATIONS  . aspirin EC  81 mg Oral Daily  . carvedilol  12.5 mg Oral BID WC  . furosemide  80 mg Oral BID  . pantoprazole  40 mg Oral Daily  . ramipril  10 mg Oral Daily  . simvastatin  40 mg Oral Daily  . Ticagrelor  90 mg Oral BID   PRN:  acetaminophen, docusate sodium, nitroGLYCERIN, ondansetron (ZOFRAN) IV, oxyCODONE-acetaminophen, simethicone, traMADol, zolpidem  Diet:  Carb Control thin liquids Activity:   As tolerated DVT Prophylaxis:  None ordered  CLINICALLY SIGNIFICANT STUDIES Basic Metabolic Panel:  Recent Labs Lab 04/11/13 0434  04/15/13 0228 04/16/13 0230  NA  --   < > 142 143  K  --   < > 3.6* 3.5*  CL  --   < > 102 98  CO2  --   < > 25 26  GLUCOSE  --   < > 138* 196*  BUN  --   < > 14 16  CREATININE 0.98  < > 0.95 1.11  CALCIUM  --   < > 8.5 8.7  MG 1.7  --   --   --   < > = values in this interval not displayed. Liver Function Tests:  Recent Labs Lab 04/10/13 2112 04/16/13 0230  AST 28 19  ALT 42 22  ALKPHOS 55 56  BILITOT 0.6 1.1  PROT 7.2 7.3  ALBUMIN 3.7 3.7   CBC:  Recent Labs Lab 04/15/13 0228 04/16/13 0230  WBC 3.9* 4.6  HGB 12.8* 13.0  HCT 38.5* 39.3  MCV 81.9 82.6  PLT 172 168   Coagulation:  Recent Labs Lab 04/10/13 2112  LABPROT 12.9  INR 0.99   Cardiac Enzymes:  Recent Labs Lab 04/12/13 1645 04/12/13 2250 04/13/13 0741  TROPONINI 1.96* 2.05* 0.91*   Urinalysis:  Recent Labs Lab 04/15/13 2207  COLORURINE YELLOW  LABSPEC 1.020  PHURINE 6.0  GLUCOSEU 250*  HGBUR NEGATIVE  BILIRUBINUR NEGATIVE  KETONESUR NEGATIVE  PROTEINUR NEGATIVE  UROBILINOGEN 0.2  NITRITE NEGATIVE  LEUKOCYTESUR NEGATIVE   Lipid Panel    Component Value Date/Time   CHOL 140 04/16/2013 0230   TRIG 151* 04/16/2013 0230   HDL 36* 04/16/2013 0230   CHOLHDL 3.9 04/16/2013 0230   VLDL 30 04/16/2013 0230   LDLCALC 74 04/16/2013 0230   HgbA1C  Lab Results  Component Value Date   HGBA1C 10.5* 01/29/2013    Urine Drug Screen:   No results found for this basename: labopia, cocainscrnur, labbenz, amphetmu, thcu, labbarb    Alcohol Level: No results found for this basename: ETH,  in the last 168 hours  CT of the brain  04/15/2013    No CT evidence for acute intracranial abnormality.    MRI of the brain  04/15/2013   Small areas of acute infarct in the cerebral hemispheres bilaterally, suggestive of cerebral emboli.   MRA of the brain  04/15/2013     Diffuse  intracranial atherosclerotic disease. 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 This could be a chronic occlusion however cannot be dated with this study.    2D Echocardiogram  01/27/2013 There was mild concentric hypertrophy. Systolic function was moderately to severely reduced. The estimated ejection fraction was in the range of 30% to 35%. Severe hypokinesis of the mid-distalanteroseptal, anterior, and inferoseptal myocardium. Possible dyskinesis of the apical myocardium. Features are consistent with a pseudonormal left ventricular filling pattern, with concomitant abnormal relaxation and increased filling pressure (grade 2 diastolic dysfunction). No evidence of thrombus.   Carotid Doppler  01/26/2013 - The vertebral arteries appear patent with antegrade flow. Findings consistent with less than 39 percent stenosis involving the right internal carotid artery and the left internal carotid artery.   CXR  04/10/2013 Mild CHF suspected, with stable moderate cardiomegaly and mild diffuse interstitial pulmonary edema.  EKG  normal sinus rhythm. For complete results please see formal report.   Therapy Recommendations no PT needs  Physical Exam   Pleasant elderly male not in distress.Awake alert. Afebrile. Head is nontraumatic. Neck is supple without bruit. Hearing is normal. Cardiac exam no murmur or gallop. Lungs are clear to auscultation. Distal pulses are well felt. Neurological Exam :    Awake  Alert oriented x 3. Normal speech and language.eye movements full without nystagmus.fundi were not visualized. Vision acuity and fields appear normal. Hearing is normal. Palatal movements are normal. Face symmetric. Tongue midline. Normal strength, tone, reflexes and coordination. Normal sensation. Gait deferred. ASSESSMENT [name] is a 73 y.o. male presenting with altered mental status and slurred speech 1 day post high-risk PCI with hemodynamics support by Impella. Imaging confirms bilateral  small infarcts. Infarcts felt to be an incidental finding found when doing MRI post syncopal event. Infarcts are embolic, a result of recent cardiac procedure.  On aspirin 81 mg orally every day and clopidogrel 75 mg orally every day prior to admission. Now on aspirin 81 mg orally every day and Brilinta for secondary stroke prevention. Patient with no resultant neuro deficits. No further stroke work up indicated.   hypertension   Hyperlipidemia, LDL 74, on zocor PTA, now on zocor, goal LDL < 70 for diabetics Diabetes, HgbA1c 10.5 in Nov 2014, goal < 7.0 Ischemic cardiomyopathy NSTEMI Nov 2014 obstructive sleep apnea  Hospital day # 6  TREATMENT/PLAN  Agree with continuation aspirin 81 mg orally every day for secondary stroke prevention.   No further stroke workup indicated  No  further stroke workup indicated.  Ongoing risk factor control by Primary Care Physician  Stroke Service will sign off. Please call should any needs arise.  No neuro follow up recommended.  SHARON BIBY, MSN, RN, ANVP-BC, ANP-Annie MainBC, Lawernce IonGNP-BC [facility] Pager: [phone] 04/16/2013 9:56 AM  I have personally obtained a history, examined the patient, evaluated imaging results, and formulated the assessment and plan of care. I agree with the above. Delia HeadyPramod Toree Edling, MD

## 2013-04-16 NOTE — Progress Notes (Signed)
Pt ambulated 500 ft with front wheel walker, tolerated well. Pt strong and denies any weakness or SOB. Pt reports knowing that he needs to walk 3x a day.   Earl Williamson, RCharity fundraiser

## 2013-04-16 NOTE — Progress Notes (Signed)
Inpatient Diabetes Program Recommendations  AACE/ADA: New Consensus Statement on Inpatient Glycemic Control (2013)  Target Ranges:  Prepandial:   less than 140 mg/dL      Peak postprandial:   less than 180 mg/dL (1-2 hours)      Critically ill patients:  140 - 180 mg/dL   Results for Earl Williamson, Earl Williamson (MRN [id number]) as of 04/16/2013 08:40  Ref. Range 04/15/2013 08:09 04/15/2013 12:02 04/15/2013 13:37  Glucose-Capillary Latest Range: 70-99 mg/dL 562110 (H) 130163 (H) 865156 (H)   Results for Earl Williamson, Earl Williamson (MRN [id number]) as of 04/16/2013 08:40  Ref. Range 04/16/2013 02:30  Glucose Latest Range: 70-99 mg/dL 284196 (H)   Diabetes history: Type 2 diabetes Outpatient Diabetes medications: Amaryl 2 mg daily and Metformin 500 mg BID Current orders for Inpatient glycemic control: NONE  Inpatient Diabetes Program Recommendations Correction (SSI): Please order CBGs with Novolog correction scale ACHS while inpatient. HgbA1C: Noted A1C has been ordered and is in process.  Thanks, Orlando PennerMarie Pachia Strum, RN, MSN, CCRN Diabetes Coordinator Inpatient Diabetes Program [phone] (Team Pager) [phone] ([facility] office) [phone] [facility] office)

## 2013-04-16 NOTE — Progress Notes (Signed)
  Echocardiogram 2D Echocardiogram has been performed.  Oley Lahaie 04/16/2013, 4:02 PM

## 2013-04-16 NOTE — Progress Notes (Signed)
CARDIAC REHAB PHASE I   PRE:  Rate/Rhythm: 73 SR  BP:  Supine: 123/53  Sitting:   Standing:    SaO2: 96 RA  MODE:  Ambulation: 350 ft   POST:  Rate/Rhythm: 86 SR  BP:  Supine:   Sitting: 152/94  Standing:    SaO2: 97 RA 1150-1230 Assisted X 1 and used walker to ambulate. Gait steady with walker. Pt able to walk 350 feet without c/o other than weak legs. Pt to side of bed after walk with call light in reach.  Melina CopaLisa Kajuan Guyton RN 04/16/2013 1:12 PM

## 2013-04-17 ENCOUNTER — Telehealth: Payer: Self-pay

## 2013-04-17 ENCOUNTER — Encounter (HOSPITAL_COMMUNITY): Payer: Self-pay | Admitting: Cardiology

## 2013-04-17 DIAGNOSIS — Z9189 Other specified personal risk factors, not elsewhere classified: Secondary | ICD-10-CM | POA: Diagnosis present

## 2013-04-17 LAB — CBC
HCT: 36.9 % — ABNORMAL LOW (ref 39.0–52.0)
Hemoglobin: 12.3 g/dL — ABNORMAL LOW (ref 13.0–17.0)
MCH: 27.1 pg (ref 26.0–34.0)
MCHC: 33.3 g/dL (ref 30.0–36.0)
MCV: 81.3 fL (ref 78.0–100.0)
Platelets: 164 10*3/uL (ref 150–400)
RBC: 4.54 MIL/uL (ref 4.22–5.81)
RDW: 14.4 % (ref 11.5–15.5)
WBC: 4 10*3/uL (ref 4.0–10.5)

## 2013-04-17 LAB — BASIC METABOLIC PANEL
BUN: 19 mg/dL (ref 6–23)
CALCIUM: 8.7 mg/dL (ref 8.4–10.5)
CO2: 24 mEq/L (ref 19–32)
CREATININE: 1.09 mg/dL (ref 0.50–1.35)
Chloride: 95 mEq/L — ABNORMAL LOW (ref 96–112)
GFR calc non Af Amer: 66 mL/min — ABNORMAL LOW (ref >90)
GFR, EST AFRICAN AMERICAN: 77 mL/min — AB (ref >90)
Glucose, Bld: 241 mg/dL — ABNORMAL HIGH (ref 70–99)
POTASSIUM: 3.3 meq/L — AB (ref 3.7–5.3)
Sodium: 138 mEq/L (ref 137–147)

## 2013-04-17 LAB — GLUCOSE, CAPILLARY
Glucose-Capillary: 171 mg/dL — ABNORMAL HIGH (ref 70–99)
Glucose-Capillary: 209 mg/dL — ABNORMAL HIGH (ref 70–99)

## 2013-04-17 LAB — PRO B NATRIURETIC PEPTIDE: Pro B Natriuretic peptide (BNP): 782.6 pg/mL — ABNORMAL HIGH (ref 0–125)

## 2013-04-17 MED ORDER — FUROSEMIDE 80 MG PO TABS: 80.0000 mg | ORAL_TABLET | Freq: Two times a day (BID) | ORAL | Status: DC

## 2013-04-17 MED ORDER — DOCUSATE SODIUM 100 MG PO CAPS
200.0000 mg | ORAL_CAPSULE | Freq: Every day | ORAL | Status: DC
  Administered 2013-04-17: 200 mg via ORAL
  Filled 2013-04-17: qty 2

## 2013-04-17 MED ORDER — METOPROLOL TARTRATE 50 MG PO TABS: 50.0000 mg | ORAL_TABLET | Freq: Two times a day (BID) | ORAL | Status: DC

## 2013-04-17 MED ORDER — TICAGRELOR 90 MG PO TABS: 90.0000 mg | ORAL_TABLET | Freq: Two times a day (BID) | ORAL | Status: AC

## 2013-04-17 MED ORDER — POTASSIUM CHLORIDE CRYS ER 20 MEQ PO TBCR
20.0000 meq | EXTENDED_RELEASE_TABLET | Freq: Once | ORAL | Status: AC
  Administered 2013-04-17: 20 meq via ORAL

## 2013-04-17 MED ORDER — ACETAMINOPHEN 325 MG PO TABS: 650.0000 mg | ORAL_TABLET | ORAL | Status: DC | PRN

## 2013-04-17 MED ORDER — NITROGLYCERIN 0.4 MG SL SUBL: 0.4000 mg | SUBLINGUAL_TABLET | SUBLINGUAL | Status: AC | PRN

## 2013-04-17 MED ORDER — TICAGRELOR 90 MG PO TABS: 90.0000 mg | ORAL_TABLET | Freq: Two times a day (BID) | ORAL | Status: DC

## 2013-04-17 MED ORDER — SPIRONOLACTONE 12.5 MG HALF TABLET: 12.5000 mg | ORAL_TABLET | Freq: Two times a day (BID) | ORAL | Status: AC

## 2013-04-17 MED ORDER — SPIRONOLACTONE 12.5 MG HALF TABLET
12.5000 mg | ORAL_TABLET | Freq: Two times a day (BID) | ORAL | Status: DC
  Administered 2013-04-17 (×2): 12.5 mg via ORAL
  Filled 2013-04-17 (×3): qty 1

## 2013-04-17 MED ORDER — CARVEDILOL 12.5 MG PO TABS: 12.5000 mg | ORAL_TABLET | Freq: Two times a day (BID) | ORAL | Status: DC

## 2013-04-17 MED ORDER — POTASSIUM CHLORIDE ER 10 MEQ PO TBCR: 10.0000 meq | EXTENDED_RELEASE_TABLET | Freq: Two times a day (BID) | ORAL | Status: AC

## 2013-04-17 MED ORDER — POTASSIUM CHLORIDE CRYS ER 20 MEQ PO TBCR
40.0000 meq | EXTENDED_RELEASE_TABLET | ORAL | Status: AC
  Administered 2013-04-17: 40 meq via ORAL

## 2013-04-17 NOTE — Telephone Encounter (Signed)
Message copied by Marilynne HalstedMOODY, Mixtli Reno R on Fri Apr 17, 2013  2:11 PM ------      Message from: Coralee RudGILLEY, SABRINA F      Created: Fri Apr 17, 2013 10:27 AM      Regarding: tcm/ph       Per Nada BoozerLaura Ingold, [facility]      NSTEMI      Dr. Herbie BaltimoreHarding      2:45       04/22/2013 ------

## 2013-04-17 NOTE — Telephone Encounter (Signed)
[facility] to [facility].  No answer and no machine at home number.

## 2013-04-17 NOTE — Progress Notes (Signed)
Patient seen, interviewed, and examined with PA Nada BoozerLaura Ingold.  He is stable from a cardiac standpoint.  He has not had any further CP.  He has some mild SOB with Brilinta.  Will continue on ACE I and beta blocker.  Dr. Herbie BaltimoreHarding recommended adding Bidil on discharge but will hold off on this due to BP being soft.  Will consult for Life Vest he needs before he goes home.  Will add Aldactone 12.5mg  BID and get into our pharmacy clinic for management of labs.  Followup with Dr. Herbie BaltimoreHarding in 1 week.  Continue on ASA/Brilinta.  Will get set up for cardiac rehab at [facility].

## 2013-04-17 NOTE — Discharge Instructions (Signed)
[facility] at [phone] if any bleeding, swelling or drainage at cath site.  May shower, no tub baths for 48 hours for groin sticks.   Weigh daily Call [phone] if weight climbs more than 3 pounds in a day or 5 pounds in a week. No salt to very little salt in your diet.  No more than 2000 mg in a day. Call if increased shortness of breath or increased swelling.   Do Not stop Brilinta, stopping could cause a heart attack.  Heart Healthy Diabetic Diet  You will follow up with Dr. Herbie BaltimoreHarding in the [place] office. Phone #  [phone]  Take 1 NTG, under your tongue, while sitting.  If no relief of pain may repeat NTG, one tab every 5 minutes up to 3 tablets total over 15 minutes.  If no relief CALL 911.  If you have dizziness/lightheadness  while taking NTG, stop taking and call 911.        Wear your life vest.    Our office will call you for way to follow aldactone medication.  Labs will be drawn with Dr. Herbie BaltimoreHarding on Wed. The 11th.

## 2013-04-17 NOTE — Progress Notes (Signed)
CARDIAC REHAB PHASE I   PRE:  Rate/Rhythm: 68 SR    BP: sitting 107/65    SaO2: 97 RA  MODE:  Ambulation: 275 ft   POST:  Rate/Rhythm: 80 SR    BP: sitting 143/75     SaO2: 97 RA  Tolerated fairly well with RW. C/o leg fatigue toward end. Discussed ex (increasing walking) and NTG. Lifevest reps arrived.  [id number]   Elissa LovettReeve, Lougenia Morrissey Herron IslandKristan CES, ACSM 04/17/2013 1:58 PM

## 2013-04-17 NOTE — Discharge Summary (Signed)
Patient stable for discharge.  Nor further workup or treatment per Neuro.  LifeVest placed.  Will reassess LVF by echo in 3 months

## 2013-04-17 NOTE — Discharge Summary (Signed)
Physician Discharge Summary       Patient ID: Earl Williamson MRN: [id number] DOB/AGE: Aug 03, 1940 73 y.o.  Admit date: 04/10/2013 Discharge date: 04/17/2013  Discharge Diagnoses:  Principal Problem:   NSTEMI (non-ST elevated myocardial infarction) Active Problems:   CAD S/P percutaneous coronary angioplasty - PCI  x 3 stents @ [facility]; CABG x 3 01/30/13 ; PCI to LIMA-LAD anastomosis, as well as proximal and distal RCA (Impella supported.   Altered mental status, secondary to embolic matter at cath   At risk for sudden cardiac death, EF 30-35%, d/c'd with lifevest   DM type 2 (diabetes mellitus, type 2)   HTN (hypertension), benign   HLD (hyperlipidemia)   Cardiomyopathy, ischemic, EF 30-35%, 01/27/13 - now roughly 20% by LV gram February 2013   Obstructive sleep apnea   Chest pain   Acute on chronic heart failure   Discharged Condition: good  Procedures: cardiac cath 04/13/13 By Dr. Excell Seltzer. 04/14/13 Intervention X 3 LIMA insertion site, Xience Alpine DES;  Mid RCA Promus DES;   And PDA with Promus DES 04/14/13 Impella hemodynamic support device insertion, temporary transvenous pacemaker insertion under fluoroscopic guidance, removed at end of procedure.  Hospital Course:  73 yo man with PMH of CAD with multiple prior stents with had 3v CABG 11/14 with last known EF 30-35% by 11/14 Echo, hypertension, dyslipidemia, T2DM who has had ongoing substernal chest pain thought related to chronic angina vs. Sternal wound healing that lasts seconds to minutes but had chest pain for upwards of an hour duration 04/10/13 leading to presentation. He had some SL NTG with potential improvement as he is now CP free. However, what he remembers about his chest tightness is that it is associated with SOB when taking a deep breath (and appears to be new component). No other sick contacts or fever/chills/nausea/vomiting/diarrhea. He may also have some GERD. In the ER he was noted to have a troponin of 0.58 so  cardiology was consulted.  Pt with leg edema as well with diureses with IV Lasix.  Recurrent chest pain on the 1st of Feb and cardiac markers had risen to 2.16.  For reoccurring pain and increased troponin pt on IV NTG and IV Hep. Also a 4 beat run on NSVT. Planned for cath 04/13/13.   Cath revealed:  1. Severe native 3 vessel CAD  2. S/p CABG with total occlusion of the SVG-OM and SVG-PDA and severe stenosis of the LIMA insertion site  3. Severe LV dysfunction  4. Patent abdominal aorta and iliac arteries  He is not candidate for re-do CABG, plans were made for probably rotational atherectomy and PCI of the RCA, PCI of the LIMA-LAD anastomosis, with hemodynamic support (Impella).   Pt and family agreed to proceed and Dr. Excell Seltzer took him back to the lab on 04/14/13 placed t. Pacing wire, Impella and proceeded with successful complex PCI of the LIMA to LAD insertion site with Xience alpine DES, mid RCA with Promus DES, and PDA with Promus DES with hemodynamic support and using an Impella device.  Pt did well until 04/15/13 in pm code stroke was called for near syncope and slurred speech with difficulty finding words.  CT of the head without acute process.  Neuro consult obtained.  MRI/MRAcompleted.  Felt to be symptoms of a vasovagal episode yesterday  MRI showed small focal defects (new) & occluded RMCA (old). After discussion with Dr. Pearlean Brownie, he feels that the symptoms described yesterday are not related to the incidental findings on MRI. Though  Dr. Pearlean BrownieSethi did mention small embolic infarcts from procedure, though would not change overall Rx plan.  Thankfully, he has no residual symptoms.  Pt transferred to tele and plans made for Life vest at discharge.  He will have meds titrated to optimal and then repeat echo in 3 months if EF continues low.    Day of discharge he was hypokalemic at 3.3 and replaced. He was seen and evaluated by Dr. Mayford Knifeurner and found stable for discharge.  He has some mild SOB with  Brilinta. Will continue on ACE I and beta blocker. Dr. Herbie BaltimoreHarding recommended adding Bidil on discharge but will hold off on this due to BP being soft.  Life Vest will be placed today prior to discharge.  Will add Aldactone 12.5mg  BID and get into our pharmacy clinic for management of labs.  He will follow up with pulmonary for OSA.  Consults: cardiology  Significant Diagnostic Studies:  BMET    Component Value Date/Time   NA 138 04/17/2013 1010   K 3.3* 04/17/2013 1010   CL 95* 04/17/2013 1010   CO2 24 04/17/2013 1010   GLUCOSE 241* 04/17/2013 1010   BUN 19 04/17/2013 1010   CREATININE 1.09 04/17/2013 1010   CALCIUM 8.7 04/17/2013 1010   GFRNONAA 66* 04/17/2013 1010   GFRAA 77* 04/17/2013 1010    CBC    Component Value Date/Time   WBC 4.0 04/17/2013 0453   RBC 4.54 04/17/2013 0453   HGB 12.3* 04/17/2013 0453   HCT 36.9* 04/17/2013 0453   PLT 164 04/17/2013 0453   MCV 81.3 04/17/2013 0453   MCH 27.1 04/17/2013 0453   MCHC 33.3 04/17/2013 0453   RDW 14.4 04/17/2013 0453   LYMPHSABS 0.7 01/25/2013 0935   MONOABS 0.5 01/25/2013 0935   EOSABS 0.0 01/25/2013 0935   BASOSABS 0.0 01/25/2013 0935   HgB A1C 7.2   PK Troponin 2.16    BNP (last 3 results)  Recent Labs  04/10/13 2217 04/17/13 1010  PROBNP 2362.0* 782.6*   CT Head: CLINICAL DATA: Code stroke. Slurred speech. Weakness.  EXAM: CT HEAD WITHOUT CONTRAST TECHNIQUE: Contiguous axial images were obtained from the base of the skull through the vertex without intravenous contrast. COMPARISON: None. FINDINGS: There is no evidence for acute hemorrhage, hydrocephalus, mass lesion, or abnormal extra-axial fluid collection. No definite CT evidence for acute infarction. Diffuse loss of parenchymal volume is consistent with atrophy. The visualized paranasal sinuses and mastoid air cells are clear. IMPRESSION: No CT evidence for acute intracranial abnormality  MRA/MRA of head: Small areas of acute infarct in the cerebral hemispheres bilaterally,  suggestive of cerebral emboli. Diffuse intracranial atherosclerotic disease. 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 This could be a chronic occlusion however cannot be dated with this study.  2D Echo: - Left ventricle: The cavity size was normal. There was severe concentric hypertrophy. Systolic function was moderately to severely reduced. The estimated ejection fraction was in the range of 30% to 35%. Diffuse hypokinesis. Doppler parameters are consistent with abnormal left ventricular relaxation (grade 1 diastolic dysfunction). - Left atrium: The atrium was mildly dilated. - Right ventricle: The cavity size was mildly dilated. Wall thickness was normal.    Discharge Exam: Blood pressure 133/73, pulse 78, temperature 99.2 F (37.3 C), temperature source Oral, resp. rate 18, height 5\' 6"  (1.676 m), weight 219 lb 14.4 oz (99.746 kg), SpO2 96.00%. AM exam:   PE: General:Pleasant affect, NAD  Skin:Warm and dry, brisk capillary refill  HEENT:normocephalic, sclera clear, mucus membranes  moist  Heart:S1S2 RRR without murmur, gallup, rub or click  Lungs: with fine rales in bases, no rhonchi, or wheezes  ZOX:WRUE, non tender, + BS, do not palpate liver spleen or masses  Ext:no lower ext edema, 2+ pedal pulses, 2+ radial pulses  Neuro:alert and oriented, MAE, follows commands, + facial symmetry  TELE: With PVCs multifocal  Disposition: 01-Home or Self Care      Discharge Orders   Future Appointments Provider Department Dept Phone   04/22/2013 2:45 PM Marykay Lex, MD [facility] [place] [phone]   04/27/2013 10:00 AM Waymon Budge, MD [facility] [phone]   04/29/2013 3:30 PM Kerin Perna, MD [facility] [phone]   05/12/2013 3:00 PM [facility] Rm [facility] [address] [phone]   05/27/2013 3:30 PM Marykay Lex, MD [facility] [phone]   Future  Orders Complete By Expires   Amb Referral to Cardiac Rehabilitation  As directed    Comments:     To [place]       Medication List    STOP taking these medications       CALCIUM CARBONATE ANTACID PO     clopidogrel 75 MG tablet  Commonly known as:  PLAVIX      TAKE these medications       acetaminophen 325 MG tablet  Commonly known as:  TYLENOL  Take 2 tablets (650 mg total) by mouth every 4 (four) hours as needed for headache or mild pain.     aspirin 81 MG EC tablet  Take 1 tablet (81 mg total) by mouth daily.     carvedilol 12.5 MG tablet  Commonly known as:  COREG  Take 1 tablet (12.5 mg total) by mouth 2 (two) times daily with a meal.     docusate sodium 100 MG capsule  Commonly known as:  COLACE  Take 100 mg by mouth daily as needed for mild constipation.     furosemide 80 MG tablet  Commonly known as:  LASIX  Take 1 tablet (80 mg total) by mouth 2 (two) times daily.     GAS-X EXTRA STRENGTH 125 MG chewable tablet  Generic drug:  simethicone  Chew 125 mg by mouth every 6 (six) hours as needed for flatulence.     glimepiride 2 MG tablet  Commonly known as:  AMARYL  Take 1 tablet (2 mg total) by mouth daily with breakfast.     metFORMIN 500 MG tablet  Commonly known as:  GLUCOPHAGE  Take 500 mg by mouth 2 (two) times daily with a meal.     metoprolol 50 MG tablet  Commonly known as:  LOPRESSOR  Take 1 tablet (50 mg total) by mouth 2 (two) times daily.     nitroGLYCERIN 0.4 MG SL tablet  Commonly known as:  NITROSTAT  Place 1 tablet (0.4 mg total) under the tongue every 5 (five) minutes as needed for chest pain.     oxyCODONE-acetaminophen 5-325 MG per tablet  Commonly known as:  PERCOCET/ROXICET  Take 1 tablet by mouth 2 (two) times daily as needed for severe pain.     pantoprazole 40 MG tablet  Commonly known as:  PROTONIX  Take 1 tablet (40 mg total) by mouth daily.     potassium chloride 10 MEQ tablet  Commonly known as:  K-DUR  Take 1  tablet (10 mEq total) by mouth 2 (two) times daily.     ramipril 10 MG  capsule  Commonly known as:  ALTACE  Take 1 capsule (10 mg total) by mouth daily.     simvastatin 40 MG tablet  Commonly known as:  ZOCOR  Take 40 mg by mouth daily.     spironolactone 12.5 mg Tabs tablet  Commonly known as:  ALDACTONE  Take 0.5 tablets (12.5 mg total) by mouth 2 (two) times daily.     Ticagrelor 90 MG Tabs tablet  Commonly known as:  BRILINTA  Take 1 tablet (90 mg total) by mouth 2 (two) times daily.     traMADol 50 MG tablet  Commonly known as:  ULTRAM  Take by mouth every 12 (twelve) hours as needed.       Follow-up Information   Follow up with Marykay Lex, MD On 04/22/2013. (at 2:45 pm,,,, please note this will be at the [facility] office not the [facility] office.)    Specialty:  Cardiology   Contact information:   [address] [place] [address] [phone]       Follow up with Waymon Budge, MD On 04/27/2013. (at 10:00 AM  for sleep evaluation discussion, bring cpap if you have one at home)    Specialty:  Pulmonary Disease   Contact information:   [address]  [facility] [place] [address] [phone]        Discharge Instructions: Call [facility] at [phone] if any bleeding, swelling or drainage at cath site.  May shower, no tub baths for 48 hours for groin sticks.   Weigh daily Call [phone] if weight climbs more than 3 pounds in a day or 5 pounds in a week. No salt to very little salt in your diet.  No more than 2000 mg in a day. Call if increased shortness of breath or increased swelling.   Do Not stop Brilinta, stopping could cause a heart attack.  Heart Healthy Diabetic Diet  You will follow up with Dr. Herbie Baltimore in the [place] office. Phone #  [phone]  Take 1 NTG, under your tongue, while sitting.  If no relief of pain may repeat NTG, one tab every 5 minutes up to 3 tablets total over 15  minutes.  If no relief CALL 911.  If you have dizziness/lightheadness  while taking NTG, stop taking and call 911.        Wear your life vest.    Our office will call you for way to follow aldactone medication.   Labs will be drawn with Dr. Herbie Baltimore on Wed. The 11th.    Signed: Leone Brand Nurse Practitioner-Certified [facility]: [facility] 04/17/2013, 12:22 PM  Time spent on discharge :55 minutes.

## 2013-04-17 NOTE — Progress Notes (Signed)
SLP Cancellation Note  Patient Details Name: Earl Williamson MRN: [id number] DOB: March 31, 1940   Cancelled treatment: ST received order for SLE.  Brief screening completed indicating receptive and expressive language skills baseline with no indication of aphasia.  Cognitive skills functional and judged baseline.  No further ST indicated.  ST to sign off.  Moreen FowlerKaren Madalaine Portier MS, CCC-SLP [phone] [facility] Center[facility]ANKOF,Keitra Carusone 04/17/2013, 11:17 AM

## 2013-04-17 NOTE — Progress Notes (Signed)
Subjective: Mild SOB at night  Objective: Vital signs in last 24 hours: Temp:  [97.8 F (36.6 C)-98.3 F (36.8 C)] 98.3 F (36.8 C) (02/06 0600) Pulse Rate:  [65-85] 71 (02/06 0600) Resp:  [18] 18 (02/06 0600) BP: (113-152)/(49-94) 131/67 mmHg (02/06 0600) SpO2:  [93 %-100 %] 93 % (02/06 0600) Weight change:  Last BM Date: 04/16/13 Intake/Output from previous day: -860 02/05 0701 - 02/06 0700 In: 240 [P.O.:240] Out: 1450 [Urine:1450] Intake/Output this shift:    PE: General:Pleasant affect, NAD Skin:Warm and dry, brisk capillary refill HEENT:normocephalic, sclera clear, mucus membranes moist Heart:S1S2 RRR without murmur, gallup, rub or click Lungs: with fine rales in bases, no rhonchi, or wheezes WUX:LKGM, non tender, + BS, do not palpate liver spleen or masses Ext:no lower ext edema, 2+ pedal pulses, 2+ radial pulses Neuro:alert and oriented, MAE, follows commands, + facial symmetry  TELE:  With PVCs multifocal Lab Results:  Recent Labs  04/16/13 0230 04/17/13 0453  WBC 4.6 4.0  HGB 13.0 12.3*  HCT 39.3 36.9*  PLT 168 164   BMET  Recent Labs  04/15/13 0228 04/16/13 0230  NA 142 143  K 3.6* 3.5*  CL 102 98  CO2 25 26  GLUCOSE 138* 196*  BUN 14 16  CREATININE 0.95 1.11  CALCIUM 8.5 8.7   No results found for this basename: TROPONINI, CK, MB,  in the last 72 hours  Lab Results  Component Value Date   CHOL 140 04/16/2013   HDL 36* 04/16/2013   LDLCALC 74 04/16/2013   TRIG 151* 04/16/2013   CHOLHDL 3.9 04/16/2013   Lab Results  Component Value Date   HGBA1C 7.2* 04/16/2013     Lab Results  Component Value Date   TSH 0.751 04/11/2013    Hepatic Function Panel  Recent Labs  04/16/13 0230  PROT 7.3  ALBUMIN 3.7  AST 19  ALT 22  ALKPHOS 56  BILITOT 1.1    Recent Labs  04/16/13 0230  CHOL 140   No results found for this basename: PROTIME,  in the last 72 hours     Studies/Results: Ct Head Wo Contrast  04/15/2013   CLINICAL  DATA:  Code stroke.  Slurred speech.  Weakness.  EXAM: CT HEAD WITHOUT CONTRAST  TECHNIQUE: Contiguous axial images were obtained from the base of the skull through the vertex without intravenous contrast.  COMPARISON:  None.  FINDINGS: There is no evidence for acute hemorrhage, hydrocephalus, mass lesion, or abnormal extra-axial fluid collection. No definite CT evidence for acute infarction. Diffuse loss of parenchymal volume is consistent with atrophy.  The visualized paranasal sinuses and mastoid air cells are clear.  IMPRESSION: No CT evidence for acute intracranial abnormality.  I personally called these findings to Dr. Roseanne Reno at 1405 hr on 04/15/2013.   Electronically Signed   By: Kennith Center M.D.   On: 04/15/2013 14:05   Mr Maxine Glenn Head Wo Contrast  04/15/2013   CLINICAL DATA:  Stroke. Recent cardiac catheterization and coronary stenting.  EXAM: MRI HEAD WITHOUT CONTRAST  MRA HEAD WITHOUT CONTRAST  TECHNIQUE: Multiplanar, multiecho pulse sequences of the brain and surrounding structures were obtained without intravenous contrast. Angiographic images of the head were obtained using MRA technique without contrast.  COMPARISON:  CT head 04/15/2013  FINDINGS: MRI HEAD FINDINGS  Small areas of acute infarct in the left frontal operculum measuring under 1 cm. Small acute infarct in the high right frontal cortex. Small area of  acute infarction in the right occipital parietal lobe. These are most likely due to small emboli.  Negative for intracranial hemorrhage.  Negative for mass lesion.  Mild generalized atrophy. Negative for hydrocephalus. Brainstem and cerebellum intact. No cortical infarct.  MRA HEAD FINDINGS  Both vertebral arteries are patent to the basilar. PICA is patent bilaterally. The basilar is tortuous but patent with mild atherosclerotic disease diffusely. Mild stenosis of the origin of the superior cerebellar artery bilaterally. Patent posterior communicating arteries bilaterally. Posterior cerebral  arteries are patent bilaterally.  Atherosclerotic irregularity and mild stenosis in the cavernous carotid bilaterally.  Moderate stenosis of the A1 segment bilaterally. A2 segments are patent bilaterally.  Right middle cerebral artery shows moderate stenosis in the temporal branch. Parietal branch is widely patent.  Occlusion of the left M1 segment. No significant flow related signal in the left middle cerebral artery branches due to slow flow. This suggests there is considerable at risk brain in the left MCA territory. This occlusion could be acute or chronic.  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  Diffuse intracranial atherosclerotic disease. 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 This could be a chronic occlusion however cannot be dated with this study.   Electronically Signed   By: Marlan Palauharles  Clark M.D.   On: 04/15/2013 19:40   Mr Brain Wo Contrast  04/15/2013   CLINICAL DATA:  Stroke. Recent cardiac catheterization and coronary stenting.  EXAM: MRI HEAD WITHOUT CONTRAST  MRA HEAD WITHOUT CONTRAST  TECHNIQUE: Multiplanar, multiecho pulse sequences of the brain and surrounding structures were obtained without intravenous contrast. Angiographic images of the head were obtained using MRA technique without contrast.  COMPARISON:  CT head 04/15/2013  FINDINGS: MRI HEAD FINDINGS  Small areas of acute infarct in the left frontal operculum measuring under 1 cm. Small acute infarct in the high right frontal cortex. Small area of acute infarction in the right occipital parietal lobe. These are most likely due to small emboli.  Negative for intracranial hemorrhage.  Negative for mass lesion.  Mild generalized atrophy. Negative for hydrocephalus. Brainstem and cerebellum intact. No cortical infarct.  MRA HEAD FINDINGS  Both vertebral arteries are patent to the basilar. PICA is patent bilaterally. The basilar is tortuous but patent with mild  atherosclerotic disease diffusely. Mild stenosis of the origin of the superior cerebellar artery bilaterally. Patent posterior communicating arteries bilaterally. Posterior cerebral arteries are patent bilaterally.  Atherosclerotic irregularity and mild stenosis in the cavernous carotid bilaterally.  Moderate stenosis of the A1 segment bilaterally. A2 segments are patent bilaterally.  Right middle cerebral artery shows moderate stenosis in the temporal branch. Parietal branch is widely patent.  Occlusion of the left M1 segment. No significant flow related signal in the left middle cerebral artery branches due to slow flow. This suggests there is considerable at risk brain in the left MCA territory. This occlusion could be acute or chronic.  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  Diffuse intracranial atherosclerotic disease. 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 This could be a chronic occlusion however cannot be dated with this study.   Electronically Signed   By: Marlan Palauharles  Clark M.D.   On: 04/15/2013 19:40   2D Echo: Left ventricle: The cavity size was normal. There was severe concentric hypertrophy. Systolic function was moderately to severely reduced. The estimated ejection fraction was in the range of 30% to 35%. Diffuse hypokinesis. Doppler  parameters are consistent with abnormal left ventricular relaxation (grade 1 diastolic dysfunction). - Left atrium: The atrium was mildly dilated. - Right ventricle: The cavity size was mildly dilated. Wall thickness was normal.    Medications: I have reviewed the patient's current medications. Scheduled Meds: . aspirin EC  81 mg Oral Daily  . carvedilol  12.5 mg Oral BID WC  . furosemide  80 mg Oral BID  . pantoprazole  40 mg Oral Daily  . ramipril  10 mg Oral Daily  . simvastatin  40 mg Oral Daily  . Ticagrelor  90 mg Oral BID   Continuous Infusions:  PRN  Meds:.acetaminophen, docusate sodium, nitroGLYCERIN, ondansetron (ZOFRAN) IV, oxyCODONE-acetaminophen, simethicone, traMADol, zolpidem  Assessment/Plan: Principal Problem:   NSTEMI (non-ST elevated myocardial infarction) Active Problems:   CAD S/P percutaneous coronary angioplasty - PCI  x 3 stents @ [facility]; CABG x 3 01/30/13 ; PCI to LIMA-LAD anastomosis, as well as proximal and distal RCA (Impella supported.   DM type 2 (diabetes mellitus, type 2)   HTN (hypertension), benign   HLD (hyperlipidemia)   Cardiomyopathy, ischemic, EF 30-35%, 01/27/13 - now roughly 20% by LV gram February 2013   Obstructive sleep apnea   Chest pain   Acute on chronic heart failure   Altered mental status  PLAN: embolic source of mental satus changes from cath per Neuro.  All resolved. Plan for discharge will need life vest.  Pt agreeable to wear. Has mild SOB with Brilinta. Diabetes stable resume Metformin as outpt. Add colace for mild constipation.  On Lasix 80 BID EF in 11/14 was 30-35% with NSTEMI undergoing CABG.  Now EF continues to be 30-35% again after NSTEMI.  He does have grade I diastolic dysfunciton.  Echo in 3 months with continued titration of medication.   Low grade fever  LOS: 7 days   Time spent with pt. :15 minutes. [facility]  Nurse Practitioner Certified Pager [phone] or after 5pm and on weekends call [phone] 04/17/2013, 8:07 AM

## 2013-04-19 ENCOUNTER — Encounter: Payer: Self-pay | Admitting: Cardiology

## 2013-04-20 ENCOUNTER — Telehealth: Payer: Self-pay | Admitting: Cardiology

## 2013-04-20 ENCOUNTER — Telehealth: Payer: Self-pay | Admitting: Pharmacist

## 2013-04-20 DIAGNOSIS — I5022 Chronic systolic (congestive) heart failure: Secondary | ICD-10-CM

## 2013-04-20 NOTE — Telephone Encounter (Signed)
Aldosterone Receptor Antagonist Initiation Clinic Study - Pharmacist Baseline Follow-Up Patient was contacted via telephone today in efforts to discuss the following:  Spironolactone Order Date: 04/17/13, picked up on 02/07 Last BMET: Inpatient 04/17/13, K+ 3.3, SCr 1.09, CrCl ~69 ml/min   Medication Availability: yes   BMET labs were reviewed with patient   Patient was counseled on the benefits vs risks of spironolactone, including its indication, interactions, importance of adherence, and management of missed doses.   Does the patient measure his/her own blood pressure or blood glucose at home?  Yes, sugars around the 130s in the AM   Understanding of regimen: fair   Understanding of indications: fair   Potential for compliance: good  Assessment & Plan:   Spoke to patient today, [name] who had fair comprehension of his medications. Of note, it was difficult to hear him over the phone as he had poor reception. However, he was able to hear me very clearly. He was recently admitted into the hospital for NSTEMI 2/2 failed CABG now s/p PTCA and stent to LIMA-LAD, arthrectomy/PTCA/stent to mid RCA, PTCA+stent PDA. Spironolactone was started, which patient was somewhat confused about. I have educated patient on indication, instructions, and risks of medication, especially in the setting of baseline hypokalemia. Pt has post-hospital appointment with Dr. Herbie BaltimoreHarding in [place] this Wednesday in which we will also draw labs. At this time, patient says he feels a little dizzy and tired, which is likely 2/2 to recent events but will assess BP at visit. Also, he asks what to do with his Onglyza (saxagliptin) although I do not see that on his medication list. Given his sugars in the 130s as reported and last A1C of 7.2%, I have advised him to hold off on medication and to mention to PCP at his appt in a few weeks. Finally, he complains of some bloating, but encouraged to use simethicone to help  mitigate symptoms.   Next scheduled BMET: Wednesday April 22, 2013   Britt BottomAlvin B. Artelia Larocheung, PharmD Clinical Pharmacist - Resident Phone: [phone] Pager: [phone] 04/20/2013 10:50 AM

## 2013-04-20 NOTE — Telephone Encounter (Signed)
Attempted to contact pt regarding [facility] on 04/17[facility]13.  Line busy, multiple attempts.

## 2013-04-20 NOTE — Telephone Encounter (Signed)
He was discharged from the hospital on Friday,he had stents put in. His stomach have been hurting all day,he is in so much pain. He wants Dr Herbie BaltimoreHarding to give him something for pain. Also bowels are not moving,please call asap.

## 2013-04-20 NOTE — Telephone Encounter (Signed)
Returned call and pt verified x 2.  Pt c/o stomach cramps.  Stated he passed some stool this morning, but not a normal BM.  Stated it feels like gas, but he isn't sure.  Pt stated he may be constipated.  Stated he has been taking a stool softener w/o relief.  Advice:  Increase water intake OTC laxative x 1 (ask pharmacist) Increase fiber in diet ER for CP, SOB, sweating, abd pain  Pt verbalized understanding and agreed w/ plan.

## 2013-04-21 ENCOUNTER — Inpatient Hospital Stay (HOSPITAL_COMMUNITY)
Admission: EM | Admit: 2013-04-21 | Discharge: 2013-04-24 | DRG: 392 | Disposition: A | Discharge status: Home / Self Care | Payer: Medicare Other | Attending: Internal Medicine | Admitting: Internal Medicine

## 2013-04-21 ENCOUNTER — Encounter (HOSPITAL_COMMUNITY): Payer: Self-pay | Admitting: Emergency Medicine

## 2013-04-21 ENCOUNTER — Emergency Department (HOSPITAL_COMMUNITY): Payer: Medicare Other

## 2013-04-21 DIAGNOSIS — E860 Dehydration: Secondary | ICD-10-CM | POA: Diagnosis present

## 2013-04-21 DIAGNOSIS — R6520 Severe sepsis without septic shock: Secondary | ICD-10-CM

## 2013-04-21 DIAGNOSIS — Z6837 Body mass index (BMI) 37.0-37.9, adult: Secondary | ICD-10-CM

## 2013-04-21 DIAGNOSIS — A419 Sepsis, unspecified organism: Secondary | ICD-10-CM

## 2013-04-21 DIAGNOSIS — Z9861 Coronary angioplasty status: Secondary | ICD-10-CM

## 2013-04-21 DIAGNOSIS — E785 Hyperlipidemia, unspecified: Secondary | ICD-10-CM

## 2013-04-21 DIAGNOSIS — K5289 Other specified noninfective gastroenteritis and colitis: Secondary | ICD-10-CM

## 2013-04-21 DIAGNOSIS — Z9189 Other specified personal risk factors, not elsewhere classified: Secondary | ICD-10-CM

## 2013-04-21 DIAGNOSIS — E1169 Type 2 diabetes mellitus with other specified complication: Secondary | ICD-10-CM | POA: Diagnosis present

## 2013-04-21 DIAGNOSIS — I10 Essential (primary) hypertension: Secondary | ICD-10-CM

## 2013-04-21 DIAGNOSIS — I509 Heart failure, unspecified: Secondary | ICD-10-CM

## 2013-04-21 DIAGNOSIS — N179 Acute kidney failure, unspecified: Secondary | ICD-10-CM

## 2013-04-21 DIAGNOSIS — I2510 Atherosclerotic heart disease of native coronary artery without angina pectoris: Secondary | ICD-10-CM

## 2013-04-21 DIAGNOSIS — E119 Type 2 diabetes mellitus without complications: Secondary | ICD-10-CM

## 2013-04-21 DIAGNOSIS — R4182 Altered mental status, unspecified: Secondary | ICD-10-CM

## 2013-04-21 DIAGNOSIS — I214 Non-ST elevation (NSTEMI) myocardial infarction: Secondary | ICD-10-CM

## 2013-04-21 DIAGNOSIS — Z418 Encounter for other procedures for purposes other than remedying health state: Secondary | ICD-10-CM

## 2013-04-21 DIAGNOSIS — Z951 Presence of aortocoronary bypass graft: Secondary | ICD-10-CM

## 2013-04-21 DIAGNOSIS — K529 Noninfective gastroenteritis and colitis, unspecified: Secondary | ICD-10-CM

## 2013-04-21 DIAGNOSIS — R079 Chest pain, unspecified: Secondary | ICD-10-CM

## 2013-04-21 DIAGNOSIS — Z2989 Encounter for other specified prophylactic measures: Secondary | ICD-10-CM

## 2013-04-21 DIAGNOSIS — I951 Orthostatic hypotension: Secondary | ICD-10-CM | POA: Diagnosis present

## 2013-04-21 DIAGNOSIS — Z7902 Long term (current) use of antithrombotics/antiplatelets: Secondary | ICD-10-CM

## 2013-04-21 DIAGNOSIS — I959 Hypotension, unspecified: Secondary | ICD-10-CM

## 2013-04-21 DIAGNOSIS — E669 Obesity, unspecified: Secondary | ICD-10-CM | POA: Diagnosis present

## 2013-04-21 DIAGNOSIS — I5022 Chronic systolic (congestive) heart failure: Secondary | ICD-10-CM | POA: Diagnosis present

## 2013-04-21 DIAGNOSIS — E118 Type 2 diabetes mellitus with unspecified complications: Secondary | ICD-10-CM | POA: Diagnosis present

## 2013-04-21 DIAGNOSIS — I255 Ischemic cardiomyopathy: Secondary | ICD-10-CM

## 2013-04-21 DIAGNOSIS — Z7982 Long term (current) use of aspirin: Secondary | ICD-10-CM

## 2013-04-21 DIAGNOSIS — A09 Infectious gastroenteritis and colitis, unspecified: Principal | ICD-10-CM

## 2013-04-21 DIAGNOSIS — I2589 Other forms of chronic ischemic heart disease: Secondary | ICD-10-CM

## 2013-04-21 DIAGNOSIS — IMO0001 Reserved for inherently not codable concepts without codable children: Secondary | ICD-10-CM

## 2013-04-21 DIAGNOSIS — Z8249 Family history of ischemic heart disease and other diseases of the circulatory system: Secondary | ICD-10-CM

## 2013-04-21 DIAGNOSIS — I252 Old myocardial infarction: Secondary | ICD-10-CM

## 2013-04-21 DIAGNOSIS — R109 Unspecified abdominal pain: Secondary | ICD-10-CM

## 2013-04-21 DIAGNOSIS — Z87891 Personal history of nicotine dependence: Secondary | ICD-10-CM

## 2013-04-21 DIAGNOSIS — G4733 Obstructive sleep apnea (adult) (pediatric): Secondary | ICD-10-CM

## 2013-04-21 LAB — BASIC METABOLIC PANEL
BUN: 35 mg/dL — AB (ref 6–23)
BUN: 34 mg/dL — ABNORMAL HIGH (ref 6–23)
CALCIUM: 8.1 mg/dL — AB (ref 8.4–10.5)
CALCIUM: 8.1 mg/dL — AB (ref 8.4–10.5)
CO2: 23 mEq/L (ref 19–32)
CO2: 23 meq/L (ref 19–32)
Chloride: 104 mEq/L (ref 96–112)
Chloride: 105 mEq/L (ref 96–112)
Creatinine, Ser: 1.98 mg/dL — ABNORMAL HIGH (ref 0.50–1.35)
Creatinine, Ser: 1.86 mg/dL — ABNORMAL HIGH (ref 0.50–1.35)
GFR calc Af Amer: 37 mL/min — ABNORMAL LOW (ref >90)
GFR calc Af Amer: 40 mL/min — ABNORMAL LOW (ref >90)
GFR, EST NON AFRICAN AMERICAN: 32 mL/min — AB (ref >90)
GFR, EST NON AFRICAN AMERICAN: 35 mL/min — AB (ref >90)
GLUCOSE: 203 mg/dL — AB (ref 70–99)
Glucose, Bld: 174 mg/dL — ABNORMAL HIGH (ref 70–99)
Potassium: 4.5 mEq/L (ref 3.7–5.3)
Potassium: 4.4 mEq/L (ref 3.7–5.3)
SODIUM: 143 meq/L (ref 137–147)
SODIUM: 144 meq/L (ref 137–147)

## 2013-04-21 LAB — CBC WITH DIFFERENTIAL/PLATELET
Basophils Absolute: 0 10*3/uL (ref 0.0–0.1)
Basophils Relative: 0 % (ref 0–1)
Eosinophils Absolute: 0.2 10*3/uL (ref 0.0–0.7)
Eosinophils Relative: 2 % (ref 0–5)
HCT: 43.2 % (ref 39.0–52.0)
Hemoglobin: 14.9 g/dL (ref 13.0–17.0)
Lymphocytes Relative: 16 % (ref 12–46)
Lymphs Abs: 1.2 10*3/uL (ref 0.7–4.0)
MCH: 28 pg (ref 26.0–34.0)
MCHC: 34.5 g/dL (ref 30.0–36.0)
MCV: 81.1 fL (ref 78.0–100.0)
Monocytes Absolute: 0.9 10*3/uL (ref 0.1–1.0)
Monocytes Relative: 12 % (ref 3–12)
Neutro Abs: 5.1 10*3/uL (ref 1.7–7.7)
Neutrophils Relative %: 69 % (ref 43–77)
Platelets: 256 10*3/uL (ref 150–400)
RBC: 5.33 MIL/uL (ref 4.22–5.81)
RDW: 14.4 % (ref 11.5–15.5)
WBC: 7.4 10*3/uL (ref 4.0–10.5)

## 2013-04-21 LAB — GLUCOSE, CAPILLARY
GLUCOSE-CAPILLARY: 155 mg/dL — AB (ref 70–99)
GLUCOSE-CAPILLARY: 120 mg/dL — AB (ref 70–99)
GLUCOSE-CAPILLARY: 125 mg/dL — AB (ref 70–99)
Glucose-Capillary: 112 mg/dL — ABNORMAL HIGH (ref 70–99)
Glucose-Capillary: 110 mg/dL — ABNORMAL HIGH (ref 70–99)

## 2013-04-21 LAB — URINALYSIS, ROUTINE W REFLEX MICROSCOPIC
Glucose, UA: 100 mg/dL — AB
Hgb urine dipstick: NEGATIVE
Ketones, ur: 15 mg/dL — AB
Leukocytes, UA: NEGATIVE
NITRITE: NEGATIVE
Protein, ur: NEGATIVE mg/dL
SPECIFIC GRAVITY, URINE: 1.027 (ref 1.005–1.030)
Urobilinogen, UA: 0.2 mg/dL (ref 0.0–1.0)
pH: 5 (ref 5.0–8.0)

## 2013-04-21 LAB — CBC
HCT: 37.9 % — ABNORMAL LOW (ref 39.0–52.0)
Hemoglobin: 12.8 g/dL — ABNORMAL LOW (ref 13.0–17.0)
MCH: 27.5 pg (ref 26.0–34.0)
MCHC: 33.8 g/dL (ref 30.0–36.0)
MCV: 81.3 fL (ref 78.0–100.0)
PLATELETS: 218 10*3/uL (ref 150–400)
RBC: 4.66 MIL/uL (ref 4.22–5.81)
RDW: 14.5 % (ref 11.5–15.5)
WBC: 6 10*3/uL (ref 4.0–10.5)

## 2013-04-21 LAB — TROPONIN I
Troponin I: <0.3 ng/mL (ref <0.30)
Troponin I: <0.3 ng/mL (ref <0.30)

## 2013-04-21 LAB — TYPE AND SCREEN
ABO/RH(D): O POS
Antibody Screen: NEGATIVE

## 2013-04-21 LAB — POCT I-STAT, CHEM 8
BUN: 35 mg/dL — ABNORMAL HIGH (ref 6–23)
Calcium, Ion: 1.11 mmol/L — ABNORMAL LOW (ref 1.13–1.30)
Chloride: 98 mEq/L (ref 96–112)
Creatinine, Ser: 2.3 mg/dL — ABNORMAL HIGH (ref 0.50–1.35)
Glucose, Bld: 262 mg/dL — ABNORMAL HIGH (ref 70–99)
HEMATOCRIT: 50 % (ref 39.0–52.0)
HEMOGLOBIN: 17 g/dL (ref 13.0–17.0)
POTASSIUM: 4.3 meq/L (ref 3.7–5.3)
Sodium: 139 mEq/L (ref 137–147)
TCO2: 27 mmol/L (ref 0–100)

## 2013-04-21 LAB — MAGNESIUM: Magnesium: 1.7 mg/dL (ref 1.5–2.5)

## 2013-04-21 LAB — LACTIC ACID, PLASMA: Lactic Acid, Venous: 0.2 mmol/L — ABNORMAL LOW (ref 0.5–2.2)

## 2013-04-21 LAB — PHOSPHORUS: PHOSPHORUS: 4.7 mg/dL — AB (ref 2.3–4.6)

## 2013-04-21 LAB — PROCALCITONIN: Procalcitonin: <0.1 ng/mL

## 2013-04-21 LAB — POCT I-STAT TROPONIN I: Troponin i, poc: 0.04 ng/mL (ref 0.00–0.08)

## 2013-04-21 LAB — LIPASE, BLOOD: LIPASE: 63 U/L — AB (ref 11–59)

## 2013-04-21 LAB — CG4 I-STAT (LACTIC ACID): Lactic Acid, Venous: 3.74 mmol/L — ABNORMAL HIGH (ref 0.5–2.2)

## 2013-04-21 MED ORDER — TICAGRELOR 90 MG PO TABS
90.0000 mg | ORAL_TABLET | Freq: Two times a day (BID) | ORAL | Status: DC
  Administered 2013-04-21 – 2013-04-24 (×7): 90 mg via ORAL
  Filled 2013-04-21 (×8): qty 1

## 2013-04-21 MED ORDER — FENTANYL CITRATE 0.05 MG/ML IJ SOLN
50.0000 ug | Freq: Once | INTRAMUSCULAR | Status: AC
  Administered 2013-04-21: 50 ug via INTRAVENOUS

## 2013-04-21 MED ORDER — SODIUM CHLORIDE 0.9 % IV BOLUS (SEPSIS)
1000.0000 mL | Freq: Once | INTRAVENOUS | Status: AC
  Administered 2013-04-21: 1000 mL via INTRAVENOUS

## 2013-04-21 MED ORDER — SODIUM CHLORIDE 0.9 % IV SOLN
INTRAVENOUS | Status: DC
  Administered 2013-04-21 – 2013-04-23 (×4): via INTRAVENOUS

## 2013-04-21 MED ORDER — PANTOPRAZOLE SODIUM 40 MG IV SOLR
40.0000 mg | Freq: Every day | INTRAVENOUS | Status: DC
  Administered 2013-04-21: 40 mg via INTRAVENOUS
  Filled 2013-04-21 (×2): qty 40

## 2013-04-21 MED ORDER — INSULIN ASPART 100 UNIT/ML ~~LOC~~ SOLN
0.0000 [IU] | Freq: Three times a day (TID) | SUBCUTANEOUS | Status: DC
  Administered 2013-04-21: 3 [IU] via SUBCUTANEOUS
  Administered 2013-04-22: 2 [IU] via SUBCUTANEOUS
  Administered 2013-04-23: 3 [IU] via SUBCUTANEOUS

## 2013-04-21 MED ORDER — SODIUM CHLORIDE 0.9 % IV SOLN: 250.0000 mL | INTRAVENOUS | Status: DC | PRN

## 2013-04-21 MED ORDER — PIPERACILLIN-TAZOBACTAM 3.375 G IVPB 30 MIN
3.3750 g | Freq: Once | INTRAVENOUS | Status: AC
  Administered 2013-04-21: 3.375 g via INTRAVENOUS
  Filled 2013-04-21: qty 50

## 2013-04-21 MED ORDER — SIMVASTATIN 40 MG PO TABS
40.0000 mg | ORAL_TABLET | Freq: Every day | ORAL | Status: DC
  Administered 2013-04-21 – 2013-04-24 (×4): 40 mg via ORAL
  Filled 2013-04-21 (×4): qty 1

## 2013-04-21 MED ORDER — INSULIN GLARGINE 100 UNIT/ML ~~LOC~~ SOLN
10.0000 [IU] | Freq: Every day | SUBCUTANEOUS | Status: DC
  Administered 2013-04-21 – 2013-04-23 (×2): 10 [IU] via SUBCUTANEOUS
  Filled 2013-04-21 (×4): qty 0.1

## 2013-04-21 MED ORDER — METRONIDAZOLE 500 MG PO TABS
500.0000 mg | ORAL_TABLET | Freq: Three times a day (TID) | ORAL | Status: DC
  Administered 2013-04-21 – 2013-04-23 (×6): 500 mg via ORAL
  Filled 2013-04-21 (×10): qty 1

## 2013-04-21 MED ORDER — FENTANYL CITRATE 0.05 MG/ML IJ SOLN
INTRAMUSCULAR | Status: AC
  Filled 2013-04-21: qty 2

## 2013-04-21 MED ORDER — ASPIRIN EC 81 MG PO TBEC
81.0000 mg | DELAYED_RELEASE_TABLET | Freq: Every day | ORAL | Status: DC
  Administered 2013-04-21 – 2013-04-24 (×4): 81 mg via ORAL
  Filled 2013-04-21 (×4): qty 1

## 2013-04-21 NOTE — ED Provider Notes (Signed)
CSN: [id number]     Arrival date & time 04/21/13  0135 History   First MD Initiated Contact with Patient 04/21/13 0138     Chief Complaint  Patient presents with  . Abdominal Pain     (Consider location/radiation/quality/duration/timing/severity/associated sxs/prior Treatment) Patient is a 73 y.o. male presenting with abdominal pain. The history is provided by the patient.  Abdominal Pain Pain location:  Generalized Pain quality: cramping   Pain radiates to:  Does not radiate Pain severity:  Severe Onset quality:  Sudden Timing:  Constant Progression:  Unchanged Chronicity:  New Context: not suspicious food intake   Relieved by:  Nothing Worsened by:  Nothing tried Ineffective treatments:  None tried Associated symptoms: vomiting   Associated symptoms: no chest pain, no constipation, no diarrhea, no dysuria, no fever, no melena and no shortness of breath   Risk factors: recent hospitalization     Past Medical History  Diagnosis Date  . ST elevation myocardial infarction (STEMI) of inferoposterior wall, subsequent episode of care 2005    [facility]) - 100% RCA -- DES; also mCx & LAD lesions  . CAD S/P percutaneous coronary angioplasty 2005    [facility] -- DES to pRCA (culprit for STEMI) - staged DES to mCx & mLAD  . Non-ST elevation myocardial infarction (NSTEMI), subsequent episode of care 01/2013    MultiVessel CAD --> CABG  . CAD in native artery 01/2013    Cath: 50% LAD ISR with 95% post-stent; prox OM1 95%; pRCA stent ~70% ISR, mRCA 90% & 60%, rPDA 99% --> CABG  . 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 Maren Beach -- LIMA-LAD, SVG-OM 1, SVG-RPDA  . NSTEMI (non-ST elevated myocardial infarction) 04/12/2013  . CAD (coronary artery disease) of bypass graft 04/13/2013    SVG-OM & SVG-RCA occluded; 99% anastomotic lesion in LIMA-LAD  . S/P primary angioplasty with coronary stent 04/14/2013    Complex Impella protected ROTAblator PCI to m & dRCA-PDA, PCI to LIMA-LAD  . Ischemic dilated cardiomyopathy  04/17/2013    EF by LV gram to 20%; by echocardiogram 30 and 35%  . At risk for sudden cardiac death - EF now ~25% 2013-02-17  . TIA 04/15/2013    MRI of brain suggested "incidental finding of several small microembolic CVA  . Diabetes mellitus type 2 in obese     Oral medications  . Obesity, Class II, BMI 35-39.9, with comorbidity   . OSA on CPAP     needs CPAP titration  . Hypertension, essential   . Hyperlipidemia LDL goal < 70    Past Surgical History  Procedure Laterality Date  . Joint replacement    . Coronary stent placement  2005    DES to proximal RCA, mid circumflex and mid LAD in the setting of STEMI  . Coronary artery bypass graft N/A 01/30/2013    Procedure: CORONARY ARTERY BYPASS GRAFTING (CABG);  Surgeon: Kerin Perna, MD;  Location: [facility] OR;  Service: Open Heart Surgery;  Laterality: N/A;  . Intraoperative transesophageal echocardiogram N/A 01/30/2013    Procedure: INTRAOPERATIVE TRANSESOPHAGEAL ECHOCARDIOGRAM;  Surgeon: Kerin Perna, MD;  Location: [facility] OR;  Service: Open Heart Surgery;  Laterality: N/A;  . Transthoracic echocardiogram  01/27/2013    EF 30-35%. Mildly dilated LV with mild concentric LVH.  Apical dyskinesis with severe HK of mid-distal anterolateral/septal and inferoseptal myocardium; grade 2 diastolic function  . Coronary angioplasty with stent placement  04/15/2013    Impella supported PCI to LIMA-LAD anastomosis, proximal-mid RCA Rotablator  guided PCI of the distal and proximal RCA and PDA PTCA  . Cardiac catheterization  04/13/13   Family History  Problem Relation Age of Onset  . Heart attack Mother   . Heart attack Sister   . Heart attack Brother    History  Substance Use Topics  . Smoking status: Former Smoker    Types: Cigarettes  . Smokeless tobacco: Never Used  . Alcohol Use: No    Review of Systems  Constitutional: Negative for fever.  Respiratory: Negative for chest tightness and shortness of breath.   Cardiovascular: Negative for  chest pain.  Gastrointestinal: Positive for vomiting and abdominal pain. Negative for diarrhea, constipation, blood in stool and melena.  Genitourinary: Negative for dysuria.  Neurological: Positive for light-headedness.  All other systems reviewed and are negative.      Allergies  Review of patient's allergies indicates no known allergies.  Home Medications   Current Outpatient Rx  Name  Route  Sig  Dispense  Refill  . acetaminophen (TYLENOL) 325 MG tablet   Oral   Take 2 tablets (650 mg total) by mouth every 4 (four) hours as needed for headache or mild pain.         Marland Kitchen aspirin EC 81 MG EC tablet   Oral   Take 1 tablet (81 mg total) by mouth daily.         . carvedilol (COREG) 12.5 MG tablet   Oral   Take 1 tablet (12.5 mg total) by mouth 2 (two) times daily with a meal.   60 tablet   6   . docusate sodium (COLACE) 100 MG capsule   Oral   Take 100 mg by mouth daily as needed for mild constipation.         . furosemide (LASIX) 80 MG tablet   Oral   Take 1 tablet (80 mg total) by mouth 2 (two) times daily.   60 tablet   6   . glimepiride (AMARYL) 2 MG tablet   Oral   Take 1 tablet (2 mg total) by mouth daily with breakfast.   30 tablet   3   . metFORMIN (GLUCOPHAGE) 500 MG tablet   Oral   Take 500 mg by mouth 2 (two) times daily with a meal.         . metoprolol (LOPRESSOR) 50 MG tablet   Oral   Take 1 tablet (50 mg total) by mouth 2 (two) times daily.   60 tablet   6   . nitroGLYCERIN (NITROSTAT) 0.4 MG SL tablet   Sublingual   Place 1 tablet (0.4 mg total) under the tongue every 5 (five) minutes as needed for chest pain.   25 tablet   4   . oxyCODONE-acetaminophen (PERCOCET/ROXICET) 5-325 MG per tablet   Oral   Take 1 tablet by mouth 2 (two) times daily as needed for severe pain.         . pantoprazole (PROTONIX) 40 MG tablet   Oral   Take 1 tablet (40 mg total) by mouth daily.   90 tablet   3   . potassium chloride (K-DUR) 10 MEQ  tablet   Oral   Take 1 tablet (10 mEq total) by mouth 2 (two) times daily.   60 tablet   6   . ramipril (ALTACE) 10 MG capsule   Oral   Take 1 capsule (10 mg total) by mouth daily.   90 capsule   3   . simethicone (GAS-X EXTRA  STRENGTH) 125 MG chewable tablet   Oral   Chew 125 mg by mouth every 6 (six) hours as needed for flatulence.          . simvastatin (ZOCOR) 40 MG tablet   Oral   Take 40 mg by mouth daily.         Marland Kitchen spironolactone (ALDACTONE) 12.5 mg TABS tablet   Oral   Take 0.5 tablets (12.5 mg total) by mouth 2 (two) times daily.   30 tablet   6   . Ticagrelor (BRILINTA) 90 MG TABS tablet   Oral   Take 1 tablet (90 mg total) by mouth 2 (two) times daily.   60 tablet   11   . traMADol (ULTRAM) 50 MG tablet   Oral   Take by mouth every 12 (twelve) hours as needed.          BP 107/45  Pulse 69  Temp(Src) 97.5 F (36.4 C) (Oral)  Resp 17  Ht 5\' 6"  (1.676 m)  Wt 215 lb (97.523 kg)  BMI 34.72 kg/m2  SpO2 98% Physical Exam  Constitutional: He is oriented to person, place, and time. He appears well-developed and well-nourished.  HENT:  Head: Normocephalic and atraumatic.  Mouth/Throat: Oropharynx is clear and moist.  Eyes: Conjunctivae and EOM are normal. Pupils are equal, round, and reactive to light.  Neck: Normal range of motion. Neck supple.  Cardiovascular: Normal rate, regular rhythm and intact distal pulses.   Pulmonary/Chest: Effort normal and breath sounds normal. He has no wheezes. He has no rales.  Abdominal: Soft. He exhibits distension. He exhibits no abdominal bruit. Bowel sounds are increased. There is tenderness.  Musculoskeletal: Normal range of motion. He exhibits no edema.  Neurological: He is alert and oriented to person, place, and time.  Skin: Skin is warm and dry. He is not diaphoretic.  Psychiatric: He has a normal mood and affect.    ED Course  Procedures (including critical care time) Labs Review Labs Reviewed   URINALYSIS, ROUTINE W REFLEX MICROSCOPIC - Abnormal; Notable for the following:    Color, Urine AMBER (*)    APPearance CLOUDY (*)    Glucose, UA 100 (*)    Bilirubin Urine SMALL (*)    Ketones, ur 15 (*)    All other components within normal limits  POCT I-STAT, CHEM 8 - Abnormal; Notable for the following:    BUN 35 (*)    Creatinine, Ser 2.30 (*)    Glucose, Bld 262 (*)    Calcium, Ion 1.11 (*)    All other components within normal limits  CG4 I-STAT (LACTIC ACID) - Abnormal; Notable for the following:    Lactic Acid, Venous 3.74 (*)    All other components within normal limits  CBC WITH DIFFERENTIAL  POCT I-STAT TROPONIN I  TYPE AND SCREEN   Imaging Review Ct Abdomen Pelvis Wo Contrast  04/21/2013   CLINICAL DATA:  Abdominal pain.  Recent median sternotomy for CABG.  EXAM: CT CHEST, ABDOMEN AND PELVIS WITHOUT CONTRAST  TECHNIQUE: Multidetector CT imaging of the chest, abdomen and pelvis was performed following the standard protocol without IV contrast.  COMPARISON:  None.  FINDINGS: CT CHEST FINDINGS  Dependent atelectasis is present in the lungs. Lingular scarring. Postoperative changes of CABG are present. Residual stranding is present in the anterior mediastinum. Sternal wires remain intact and the median sternotomy defect appears within normal limits for this timeframe after operation. Mild cardiomegaly is present. There is no axillary adenopathy. No  mediastinal or hilar adenopathy. No aggressive osseous lesions in the chest.  CT ABDOMEN AND PELVIS FINDINGS  Liver: Tiny low-density lesion in the inferior right hepatic lobe probably represents a cyst. Larger lesion in the right hepatic dome measures 18 mm (image 47 series 2), also probably a cyst.  Small amount of ascites is present around the liver and spleen.  Spleen:  Normal.  Gallbladder:  Biliary sludge with high density material in the neck.  Common bile duct:  Normal.  Pancreas:  Normal.  Adrenal glands: Thickening bilaterally,  and most compatible with hyperplasia.  Kidneys: Vascular calcifications. No obstruction/hydronephrosis. Both ureters appear normal.  Stomach:  Collapsed.  Grossly normal.  Small bowel: Enteritis is present in the anatomic pelvis. There is no bowel obstruction. Stranding surrounds loops of small bowel and a small amount of ascites is present around pelvic small bowel as well. Stranding extends into the mesentery. There is no pneumatosis.  Colon: Fluid is present within the colon, abnormal but nonspecific. No colonic mural thickening is identified.  Pelvic Genitourinary: Collapsed urinary bladder. Prostate calcifications. Fluid in the anatomic pelvis is low to intermediate attenuation and probably represents minimally complex ascites. No pelvic abscess.  Bones: No aggressive osseous lesions. Severe left hip osteoarthritis, which appears secondary to either AVN or developmental hip dysplasia. Heterotopic bone adjacent to the left hip. 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  Vasculature: Atherosclerosis without an acute vascular abnormality.  Body Wall: Stranding is present in both inguinal regions compatible with recent vascular access. No fluid collection/abscess. No retroperitoneal hemorrhage.  IMPRESSION: 1. Thickening of small bowel loops in the anatomic pelvis compatible with enteritis. Differential considerations are inflammatory bowel disease, infection, and ischemia. No pneumatosis. 2. Small amount of ascites, with intermediate attenuation in the pelvis suggesting debris or complex ascites. 3. Postoperative changes compatible with recent CABG and bilateral femoral artery access. 4. Fluid levels within the colon are probably related this small bowel process. 5. These results were called by telephone at the time of interpretation on 04/21/2013 at 3:18 AM to Dr. Deanna ArtisAPRIL Takeyla Million-RASCH , who verbally acknowledged these results.   Electronically Signed   By: Andreas NewportGeoffrey  Lamke M.D.   On:  04/21/2013 03:19   Ct Chest Wo Contrast  04/21/2013   CLINICAL DATA:  Abdominal pain.  Recent median sternotomy for CABG.  EXAM: CT CHEST, ABDOMEN AND PELVIS WITHOUT CONTRAST  TECHNIQUE: Multidetector CT imaging of the chest, abdomen and pelvis was performed following the standard protocol without IV contrast.  COMPARISON:  None.  FINDINGS: CT CHEST FINDINGS  Dependent atelectasis is present in the lungs. Lingular scarring. Postoperative changes of CABG are present. Residual stranding is present in the anterior mediastinum. Sternal wires remain intact and the median sternotomy defect appears within normal limits for this timeframe after operation. Mild cardiomegaly is present. There is no axillary adenopathy. No mediastinal or hilar adenopathy. No aggressive osseous lesions in the chest.  CT ABDOMEN AND PELVIS FINDINGS  Liver: Tiny low-density lesion in the inferior right hepatic lobe probably represents a cyst. Larger lesion in the right hepatic dome measures 18 mm (image 47 series 2), also probably a cyst.  Small amount of ascites is present around the liver and spleen.  Spleen:  Normal.  Gallbladder:  Biliary sludge with high density material in the neck.  Common bile duct:  Normal.  Pancreas:  Normal.  Adrenal glands: Thickening bilaterally, and most compatible with hyperplasia.  Kidneys: Vascular calcifications. No obstruction/hydronephrosis. Both ureters appear normal.  Stomach:  Collapsed.  Grossly normal.  Small bowel: Enteritis is present in the anatomic pelvis. There is no bowel obstruction. Stranding surrounds loops of small bowel and a small amount of ascites is present around pelvic small bowel as well. Stranding extends into the mesentery. There is no pneumatosis.  Colon: Fluid is present within the colon, abnormal but nonspecific. No colonic mural thickening is identified.  Pelvic Genitourinary: Collapsed urinary bladder. Prostate calcifications. Fluid in the anatomic pelvis is low to intermediate  attenuation and probably represents minimally complex ascites. No pelvic abscess.  Bones: No aggressive osseous lesions. Severe left hip osteoarthritis, which appears secondary to either AVN or developmental hip dysplasia. Heterotopic bone adjacent to the left hip. 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  Vasculature: Atherosclerosis without an acute vascular abnormality.  Body Wall: Stranding is present in both inguinal regions compatible with recent vascular access. No fluid collection/abscess. No retroperitoneal hemorrhage.  IMPRESSION: 1. Thickening of small bowel loops in the anatomic pelvis compatible with enteritis. Differential considerations are inflammatory bowel disease, infection, and ischemia. No pneumatosis. 2. Small amount of ascites, with intermediate attenuation in the pelvis suggesting debris or complex ascites. 3. Postoperative changes compatible with recent CABG and bilateral femoral artery access. 4. Fluid levels within the colon are probably related this small bowel process. 5. These results were called by telephone at the time of interpretation on 04/21/2013 at 3:18 AM to Dr. Deanna Artis , who verbally acknowledged these results.   Electronically Signed   By: Andreas Newport M.D.   On: 04/21/2013 03:19    EKG Interpretation    Date/Time:  Tuesday April 21 2013 02:24:49 EST Ventricular Rate:  71 PR Interval:  187 QRS Duration: 109 QT Interval:  426 QTC Calculation: 463 R Axis:   72 Text Interpretation:  Sinus rhythm Anterior infarct, old Abnormal T, consider ischemia, diffuse leads Confirmed by [facility]  MD, Baraka Klatt ([id number]) on 04/21/2013 2:54:10 AM            MDM   Final diagnoses:  Abdominal pain  Hypotension    MDM Reviewed: previous chart, nursing note and vitals Reviewed previous: ECG and labs Interpretation: labs, ECG and CT scan (elevated lactate, increased creatinine) Total time providing critical care: 75-105 minutes.  This excludes time spent performing separately reportable procedures and services. Consults: cardiology and pulmonary   Medications  piperacillin-tazobactam (ZOSYN) IVPB 3.375 g (not administered)  fentaNYL (SUBLIMAZE) injection 50 mcg (50 mcg Intravenous Given 04/21/13 0301)  sodium chloride 0.9 % bolus 1,000 mL (1,000 mLs Intravenous New Bag/Given 04/21/13 0244)  sodium chloride 0.9 % bolus 1,000 mL (0 mLs Intravenous Stopped 04/21/13 0342)   Results for orders placed during the hospital encounter of 04/21/13  CBC WITH DIFFERENTIAL      Result Value Range   WBC 7.4  4.0 - 10.5 K/uL   RBC 5.33  4.22 - 5.81 MIL/uL   Hemoglobin 14.9  13.0 - 17.0 g/dL   HCT 21.3  08.6 - 57.8 %   MCV 81.1  78.0 - 100.0 fL   MCH 28.0  26.0 - 34.0 pg   MCHC 34.5  30.0 - 36.0 g/dL   RDW 46.9  62.9 - 52.8 %   Platelets 256  150 - 400 K/uL   Neutrophils Relative % 69  43 - 77 %   Neutro Abs 5.1  1.7 - 7.7 K/uL   Lymphocytes Relative 16  12 - 46 %   Lymphs Abs 1.2  0.7 - 4.0  K/uL   Monocytes Relative 12  3 - 12 %   Monocytes Absolute 0.9  0.1 - 1.0 K/uL   Eosinophils Relative 2  0 - 5 %   Eosinophils Absolute 0.2  0.0 - 0.7 K/uL   Basophils Relative 0  0 - 1 %   Basophils Absolute 0.0  0.0 - 0.1 K/uL  URINALYSIS, ROUTINE W REFLEX MICROSCOPIC      Result Value Range   Color, Urine AMBER (*) YELLOW   APPearance CLOUDY (*) CLEAR   Specific Gravity, Urine 1.027  1.005 - 1.030   pH 5.0  5.0 - 8.0   Glucose, UA 100 (*) NEGATIVE mg/dL   Hgb urine dipstick NEGATIVE  NEGATIVE   Bilirubin Urine SMALL (*) NEGATIVE   Ketones, ur 15 (*) NEGATIVE mg/dL   Protein, ur NEGATIVE  NEGATIVE mg/dL   Urobilinogen, UA 0.2  0.0 - 1.0 mg/dL   Nitrite NEGATIVE  NEGATIVE   Leukocytes, UA NEGATIVE  NEGATIVE  POCT I-STAT, CHEM 8      Result Value Range   Sodium 139  137 - 147 mEq/L   Potassium 4.3  3.7 - 5.3 mEq/L   Chloride 98  96 - 112 mEq/L   BUN 35 (*) 6 - 23 mg/dL   Creatinine, Ser 4.09 (*) 0.50 - 1.35 mg/dL    Glucose, Bld 811 (*) 70 - 99 mg/dL   Calcium, Ion 9.14 (*) 1.13 - 1.30 mmol/L   TCO2 27  0 - 100 mmol/L   Hemoglobin 17.0  13.0 - 17.0 g/dL   HCT 78.2  95.6 - 21.3 %  CG4 I-STAT (LACTIC ACID)      Result Value Range   Lactic Acid, Venous 3.74 (*) 0.5 - 2.2 mmol/L  POCT I-STAT TROPONIN I      Result Value Range   Troponin i, poc 0.04  0.00 - 0.08 ng/mL   Comment 3            Ct Abdomen Pelvis Wo Contrast  04/21/2013   CLINICAL DATA:  Abdominal pain.  Recent median sternotomy for CABG.  EXAM: CT CHEST, ABDOMEN AND PELVIS WITHOUT CONTRAST  TECHNIQUE: Multidetector CT imaging of the chest, abdomen and pelvis was performed following the standard protocol without IV contrast.  COMPARISON:  None.  FINDINGS: CT CHEST FINDINGS  Dependent atelectasis is present in the lungs. Lingular scarring. Postoperative changes of CABG are present. Residual stranding is present in the anterior mediastinum. Sternal wires remain intact and the median sternotomy defect appears within normal limits for this timeframe after operation. Mild cardiomegaly is present. There is no axillary adenopathy. No mediastinal or hilar adenopathy. No aggressive osseous lesions in the chest.  CT ABDOMEN AND PELVIS FINDINGS  Liver: Tiny low-density lesion in the inferior right hepatic lobe probably represents a cyst. Larger lesion in the right hepatic dome measures 18 mm (image 47 series 2), also probably a cyst.  Small amount of ascites is present around the liver and spleen.  Spleen:  Normal.  Gallbladder:  Biliary sludge with high density material in the neck.  Common bile duct:  Normal.  Pancreas:  Normal.  Adrenal glands: Thickening bilaterally, and most compatible with hyperplasia.  Kidneys: Vascular calcifications. No obstruction/hydronephrosis. Both ureters appear normal.  Stomach:  Collapsed.  Grossly normal.  Small bowel: Enteritis is present in the anatomic pelvis. There is no bowel obstruction. Stranding surrounds loops of small bowel  and a small amount of ascites is present around pelvic small bowel as well. Stranding  extends into the mesentery. There is no pneumatosis.  Colon: Fluid is present within the colon, abnormal but nonspecific. No colonic mural thickening is identified.  Pelvic Genitourinary: Collapsed urinary bladder. Prostate calcifications. Fluid in the anatomic pelvis is low to intermediate attenuation and probably represents minimally complex ascites. No pelvic abscess.  Bones: No aggressive osseous lesions. Severe left hip osteoarthritis, which appears secondary to either AVN or developmental hip dysplasia. Heterotopic bone adjacent to the left hip. 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  Vasculature: Atherosclerosis without an acute vascular abnormality.  Body Wall: Stranding is present in both inguinal regions compatible with recent vascular access. No fluid collection/abscess. No retroperitoneal hemorrhage.  IMPRESSION: 1. Thickening of small bowel loops in the anatomic pelvis compatible with enteritis. Differential considerations are inflammatory bowel disease, infection, and ischemia. No pneumatosis. 2. Small amount of ascites, with intermediate attenuation in the pelvis suggesting debris or complex ascites. 3. Postoperative changes compatible with recent CABG and bilateral femoral artery access. 4. Fluid levels within the colon are probably related this small bowel process. 5. These results were called by telephone at the time of interpretation on 04/21/2013 at 3:18 AM to Dr. Deanna Artis , who verbally acknowledged these results.   Electronically Signed   By: Andreas Newport M.D.   On: 04/21/2013 03:19   Ct Head Wo Contrast  04/15/2013   CLINICAL DATA:  Code stroke.  Slurred speech.  Weakness.  EXAM: CT HEAD WITHOUT CONTRAST  TECHNIQUE: Contiguous axial images were obtained from the base of the skull through the vertex without intravenous contrast.  COMPARISON:  None.  FINDINGS: There is  no evidence for acute hemorrhage, hydrocephalus, mass lesion, or abnormal extra-axial fluid collection. No definite CT evidence for acute infarction. Diffuse loss of parenchymal volume is consistent with atrophy.  The visualized paranasal sinuses and mastoid air cells are clear.  IMPRESSION: No CT evidence for acute intracranial abnormality.  I personally called these findings to Dr. Roseanne Reno at 1405 hr on 04/15/2013.   Electronically Signed   By: Kennith Center M.D.   On: 04/15/2013 14:05   Ct Chest Wo Contrast  04/21/2013   CLINICAL DATA:  Abdominal pain.  Recent median sternotomy for CABG.  EXAM: CT CHEST, ABDOMEN AND PELVIS WITHOUT CONTRAST  TECHNIQUE: Multidetector CT imaging of the chest, abdomen and pelvis was performed following the standard protocol without IV contrast.  COMPARISON:  None.  FINDINGS: CT CHEST FINDINGS  Dependent atelectasis is present in the lungs. Lingular scarring. Postoperative changes of CABG are present. Residual stranding is present in the anterior mediastinum. Sternal wires remain intact and the median sternotomy defect appears within normal limits for this timeframe after operation. Mild cardiomegaly is present. There is no axillary adenopathy. No mediastinal or hilar adenopathy. No aggressive osseous lesions in the chest.  CT ABDOMEN AND PELVIS FINDINGS  Liver: Tiny low-density lesion in the inferior right hepatic lobe probably represents a cyst. Larger lesion in the right hepatic dome measures 18 mm (image 47 series 2), also probably a cyst.  Small amount of ascites is present around the liver and spleen.  Spleen:  Normal.  Gallbladder:  Biliary sludge with high density material in the neck.  Common bile duct:  Normal.  Pancreas:  Normal.  Adrenal glands: Thickening bilaterally, and most compatible with hyperplasia.  Kidneys: Vascular calcifications. No obstruction/hydronephrosis. Both ureters appear normal.  Stomach:  Collapsed.  Grossly normal.  Small bowel: Enteritis is  present in the anatomic pelvis. There is no  bowel obstruction. Stranding surrounds loops of small bowel and a small amount of ascites is present around pelvic small bowel as well. Stranding extends into the mesentery. There is no pneumatosis.  Colon: Fluid is present within the colon, abnormal but nonspecific. No colonic mural thickening is identified.  Pelvic Genitourinary: Collapsed urinary bladder. Prostate calcifications. Fluid in the anatomic pelvis is low to intermediate attenuation and probably represents minimally complex ascites. No pelvic abscess.  Bones: No aggressive osseous lesions. Severe left hip osteoarthritis, which appears secondary to either AVN or developmental hip dysplasia. Heterotopic bone adjacent to the left hip. 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  Vasculature: Atherosclerosis without an acute vascular abnormality.  Body Wall: Stranding is present in both inguinal regions compatible with recent vascular access. No fluid collection/abscess. No retroperitoneal hemorrhage.  IMPRESSION: 1. Thickening of small bowel loops in the anatomic pelvis compatible with enteritis. Differential considerations are inflammatory bowel disease, infection, and ischemia. No pneumatosis. 2. Small amount of ascites, with intermediate attenuation in the pelvis suggesting debris or complex ascites. 3. Postoperative changes compatible with recent CABG and bilateral femoral artery access. 4. Fluid levels within the colon are probably related this small bowel process. 5. These results were called by telephone at the time of interpretation on 04/21/2013 at 3:18 AM to Dr. Deanna Artis , who verbally acknowledged these results.   Electronically Signed   By: Andreas Newport M.D.   On: 04/21/2013 03:19   Mr Maxine Glenn Head Wo Contrast  04/15/2013   CLINICAL DATA:  Stroke. Recent cardiac catheterization and coronary stenting.  EXAM: MRI HEAD WITHOUT CONTRAST  MRA HEAD WITHOUT CONTRAST   TECHNIQUE: Multiplanar, multiecho pulse sequences of the brain and surrounding structures were obtained without intravenous contrast. Angiographic images of the head were obtained using MRA technique without contrast.  COMPARISON:  CT head 04/15/2013  FINDINGS: MRI HEAD FINDINGS  Small areas of acute infarct in the left frontal operculum measuring under 1 cm. Small acute infarct in the high right frontal cortex. Small area of acute infarction in the right occipital parietal lobe. These are most likely due to small emboli.  Negative for intracranial hemorrhage.  Negative for mass lesion.  Mild generalized atrophy. Negative for hydrocephalus. Brainstem and cerebellum intact. No cortical infarct.  MRA HEAD FINDINGS  Both vertebral arteries are patent to the basilar. PICA is patent bilaterally. The basilar is tortuous but patent with mild atherosclerotic disease diffusely. Mild stenosis of the origin of the superior cerebellar artery bilaterally. Patent posterior communicating arteries bilaterally. Posterior cerebral arteries are patent bilaterally.  Atherosclerotic irregularity and mild stenosis in the cavernous carotid bilaterally.  Moderate stenosis of the A1 segment bilaterally. A2 segments are patent bilaterally.  Right middle cerebral artery shows moderate stenosis in the temporal branch. Parietal branch is widely patent.  Occlusion of the left M1 segment. No significant flow related signal in the left middle cerebral artery branches due to slow flow. This suggests there is considerable at risk brain in the left MCA territory. This occlusion could be acute or chronic.  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  Diffuse intracranial atherosclerotic disease. 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 This could be a chronic occlusion however cannot be dated with this study.   Electronically Signed   By: Marlan Palau M.D.   On: 04/15/2013  19:40   Mr Brain Wo Contrast  04/15/2013   CLINICAL DATA:  Stroke. Recent cardiac catheterization and  coronary stenting.  EXAM: MRI HEAD WITHOUT CONTRAST  MRA HEAD WITHOUT CONTRAST  TECHNIQUE: Multiplanar, multiecho pulse sequences of the brain and surrounding structures were obtained without intravenous contrast. Angiographic images of the head were obtained using MRA technique without contrast.  COMPARISON:  CT head 04/15/2013  FINDINGS: MRI HEAD FINDINGS  Small areas of acute infarct in the left frontal operculum measuring under 1 cm. Small acute infarct in the high right frontal cortex. Small area of acute infarction in the right occipital parietal lobe. These are most likely due to small emboli.  Negative for intracranial hemorrhage.  Negative for mass lesion.  Mild generalized atrophy. Negative for hydrocephalus. Brainstem and cerebellum intact. No cortical infarct.  MRA HEAD FINDINGS  Both vertebral arteries are patent to the basilar. PICA is patent bilaterally. The basilar is tortuous but patent with mild atherosclerotic disease diffusely. Mild stenosis of the origin of the superior cerebellar artery bilaterally. Patent posterior communicating arteries bilaterally. Posterior cerebral arteries are patent bilaterally.  Atherosclerotic irregularity and mild stenosis in the cavernous carotid bilaterally.  Moderate stenosis of the A1 segment bilaterally. A2 segments are patent bilaterally.  Right middle cerebral artery shows moderate stenosis in the temporal branch. Parietal branch is widely patent.  Occlusion of the left M1 segment. No significant flow related signal in the left middle cerebral artery branches due to slow flow. This suggests there is considerable at risk brain in the left MCA territory. This occlusion could be acute or chronic.  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  Diffuse intracranial atherosclerotic disease.  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 This could be a chronic occlusion however cannot be dated with this study.   Electronically Signed   By: Marlan Palau M.D.   On: 04/15/2013 19:40   Dg Chest Portable 1 View  04/10/2013   CLINICAL DATA:  Sternotomy for CABG 01/30/2013. Diaphoresis. Lightheaded. Chest pain. Left upper extremity tingling.  EXAM: PORTABLE CHEST - 1 VIEW  COMPARISON:  DG CHEST 2 VIEW dated 03/18/2013; DG CHEST 2 VIEW dated 02/25/2013; DG CHEST 2 VIEW dated 02/04/2013; DG CHEST 2 VIEW dated 02/03/2013; DG CHEST 1V PORT dated 01/30/2013; DG CHEST 2 VIEW dated 01/25/2013  FINDINGS: Sternotomy for CABG. Cardiac silhouette moderately enlarged but stable. Interval development of mild diffuse interstitial pulmonary edema as evidenced by Charyl Dancer being Kerley C lines. No confluent airspace consolidation. No visible pleural effusions.  IMPRESSION: Mild CHF suspected, with stable moderate cardiomegaly and mild diffuse interstitial pulmonary edema.   Electronically Signed   By: Hulan Saas M.D.   On: 04/10/2013 21:30    CRITICAL CARE Performed by: Jasmine Awe Total critical care time: 91 minutes Critical care time was exclusive of separately billable procedures and treating other patients. Critical care was necessary to treat or prevent imminent or life-threatening deterioration. Critical care was time spent personally by me on the following activities: development of treatment plan with patient and/or surrogate as well as nursing, discussions with consultants, evaluation of patient's response to treatment, examination of patient, obtaining history from patient or surrogate, ordering and performing treatments and interventions, ordering and review of laboratory studies, ordering and review of radiographic studies, pulse oximetry and re-evaluation of patient's condition.      Jasmine Awe, MD 04/21/13 [phone]

## 2013-04-21 NOTE — H&P (Addendum)
Name: Earl Williamson MRN: [id number] DOB: October 11, 1940    ADMISSION DATE:  04/21/2013 CONSULTATION DATE:  2/10  REFERRING MD :  Nicanor Alcon PRIMARY SERVICE: PCCM   CHIEF COMPLAINT:  abd pain/ orthostasis   BRIEF PATIENT DESCRIPTION:  73 year old male w/ complex cardiac history w/ severe residual CAD w/ failed grafts s/p CABG 01/2013. Recently admitted by Cardiology on 2/3 for PCI and stent  to LIMA-LAD as well as  Impella w/ PCI and stent to RCA. Discharged to home on 2/6 on brilinta, aspirin, ace I, bb, and life vest. Presented on 2/9 w/ 1 day h/o cramping abd pain primarily over LLQ, N/V and orthostatic hypotension. CT abd showed:  Thickening of small bowel loops in the anatomic pelvis compatible with enteritis. PCCM was asked to admit given the findings of hypotension, and new AKI.  SIGNIFICANT EVENTS / STUDIES:  CT abd pelvis 2/10: Thickening of small bowel loops in the anatomic pelvis compatible with enteritis. Differential considerations are inflammatory bowel disease, infection, and ischemia. No pneumatosis. CT chest 2/10: Dependent atelectasis is present in the lungs. Lingular scarring. Postoperative changes of CABG are present. Residual stranding is present in the anterior mediastinum. Sternal wires remain intact and  the median sternotomy defect appears within normal limits for this timeframe after operation.    LINES / TUBES:   CULTURES: cdiff 2/10>>>  ANTIBIOTICS: Flagyl 2/10>>>  HISTORY OF PRESENT ILLNESS:   73 year old male w/ complex cardiac history w/ severe residual CAD w/ failed grafts s/p CABG. Recently admitted by Cardiology on 2/3 for PCI and stent  to LIMA-LAD as well as  Impella w/ PCI and stent to RCA. Course was complicated by brief near syncopal event for which neuro was consulted. Dx eval did show small embolic infarcts, but it was felt that his symptoms were vasovagal in origin. He was discharged to home on 2/6 on brilinta, aspirin, ace I, bb, and life vest.  Presented on 2/9 w/ 1 day h/o cramping abd pain primarily over LLQ. He felt initially due to constipation. Did take MOM at home.  Reported unable to take pos, had episode of vomiting, called EMS as he felt light headed. On arrival EMS found him orthostatic. IVFs were initiated w/ improvement in BP. CT abd/pelvis and chest was obtained.There was concern about possible risk of embolic related injury to the gut after recent Impella procedure. CT abd showed:  Thickening of small bowel loops in the anatomic pelvis compatible with enteritis. PCCM was asked to admit given the findings of hypotension, and new AKI.   PAST MEDICAL HISTORY :  Past Medical History  Diagnosis Date  . ST elevation myocardial infarction (STEMI) of inferoposterior wall, subsequent episode of care 2005    [facility]) - 100% RCA -- DES; also mCx & LAD lesions  . CAD S/P percutaneous coronary angioplasty 2005    [facility] -- DES to pRCA (culprit for STEMI) - staged DES to mCx & mLAD  . Non-ST elevation myocardial infarction (NSTEMI), subsequent episode of care 01/2013    MultiVessel CAD --> CABG  . CAD in native artery 01/2013    Cath: 50% LAD ISR with 95% post-stent; prox OM1 95%; pRCA stent ~70% ISR, mRCA 90% & 60%, rPDA 99% --> CABG  . 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 Maren Beach -- LIMA-LAD, SVG-OM 1, SVG-RPDA  . NSTEMI (non-ST elevated myocardial infarction) 04/12/2013  . CAD (coronary artery disease) of bypass graft 04/13/2013    SVG-OM & SVG-RCA occluded;  99% anastomotic lesion in LIMA-LAD  . S/P primary angioplasty with coronary stent 04/14/2013    Complex Impella protected ROTAblator PCI to m & dRCA-PDA, PCI to LIMA-LAD  . 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 At risk for sudden cardiac death - EF now ~25% 2013-02-15  . TIA 04/15/2013    MRI of brain suggested "incidental finding of several small microembolic CVA  . Diabetes mellitus type 2 in obese     Oral medications  . Obesity,  Class II, BMI 35-39.9, with comorbidity   . OSA on CPAP     needs CPAP titration  . Hypertension, essential   . Hyperlipidemia LDL goal < 70    Past Surgical History  Procedure Laterality Date  . Joint replacement    . Coronary stent placement  2005    DES to proximal RCA, mid circumflex and mid LAD in the setting of STEMI  . Coronary artery bypass graft N/A 01/30/2013    Procedure: CORONARY ARTERY BYPASS GRAFTING (CABG);  Surgeon: Kerin Perna, MD;  Location: [facility] OR;  Service: Open Heart Surgery;  Laterality: N/A;  . Intraoperative transesophageal echocardiogram N/A 01/30/2013    Procedure: INTRAOPERATIVE TRANSESOPHAGEAL ECHOCARDIOGRAM;  Surgeon: Kerin Perna, MD;  Location: [facility] OR;  Service: Open Heart Surgery;  Laterality: N/A;  . Transthoracic echocardiogram  01/27/2013    EF 30-35%. Mildly dilated LV with mild concentric LVH.  Apical dyskinesis with severe HK of mid-distal anterolateral/septal and inferoseptal myocardium; grade 2 diastolic function  . Coronary angioplasty with stent placement  04/15/2013    Impella supported PCI to LIMA-LAD anastomosis, proximal-mid RCA Rotablator guided PCI of the distal and proximal RCA and PDA PTCA  . Cardiac catheterization  04/13/13   Prior to Admission medications   Medication Sig Start Date End Date Taking? Authorizing Provider  acetaminophen (TYLENOL) 325 MG tablet Take 2 tablets (650 mg total) by mouth every 4 (four) hours as needed for headache or mild pain. 04/17/13   Nada Boozer, NP  aspirin EC 81 MG EC tablet Take 1 tablet (81 mg total) by mouth daily. 02/06/13   Erin Barrett, PA-C  carvedilol (COREG) 12.5 MG tablet Take 1 tablet (12.5 mg total) by mouth 2 (two) times daily with a meal. 04/17/13   Nada Boozer, NP  docusate sodium (COLACE) 100 MG capsule Take 100 mg by mouth daily as needed for mild constipation.    Historical Provider, MD  furosemide (LASIX) 80 MG tablet Take 1 tablet (80 mg total) by mouth 2 (two) times daily. 04/17/13    Nada Boozer, NP  glimepiride (AMARYL) 2 MG tablet Take 1 tablet (2 mg total) by mouth daily with breakfast. 02/06/13   Erin Barrett, PA-C  metFORMIN (GLUCOPHAGE) 500 MG tablet Take 500 mg by mouth 2 (two) times daily with a meal.    Historical Provider, MD  metoprolol (LOPRESSOR) 50 MG tablet Take 1 tablet (50 mg total) by mouth 2 (two) times daily. 04/17/13   Nada Boozer, NP  nitroGLYCERIN (NITROSTAT) 0.4 MG SL tablet Place 1 tablet (0.4 mg total) under the tongue every 5 (five) minutes as needed for chest pain. 04/17/13   Nada Boozer, NP  oxyCODONE-acetaminophen (PERCOCET/ROXICET) 5-325 MG per tablet Take 1 tablet by mouth 2 (two) times daily as needed for severe pain.    Historical Provider, MD  pantoprazole (PROTONIX) 40 MG tablet Take 1 tablet (40 mg total) by mouth daily. 04/02/13  Marykay Lex, MD  potassium chloride (K-DUR) 10 MEQ tablet Take 1 tablet (10 mEq total) by mouth 2 (two) times daily. 04/17/13   Nada Boozer, NP  ramipril (ALTACE) 10 MG capsule Take 1 capsule (10 mg total) by mouth daily. 04/02/13   Marykay Lex, MD  simethicone (GAS-X EXTRA STRENGTH) 125 MG chewable tablet Chew 125 mg by mouth every 6 (six) hours as needed for flatulence.     Historical Provider, MD  simvastatin (ZOCOR) 40 MG tablet Take 40 mg by mouth daily.    Historical Provider, MD  spironolactone (ALDACTONE) 12.5 mg TABS tablet Take 0.5 tablets (12.5 mg total) by mouth 2 (two) times daily. 04/17/13   Nada Boozer, NP  Ticagrelor (BRILINTA) 90 MG TABS tablet Take 1 tablet (90 mg total) by mouth 2 (two) times daily. 04/17/13   Nada Boozer, NP  traMADol (ULTRAM) 50 MG tablet Take by mouth every 12 (twelve) hours as needed.    Historical Provider, MD   No Known Allergies  FAMILY HISTORY:  Family History  Problem Relation Age of Onset  . Heart attack Mother   . Heart attack Sister   . Heart attack Brother    SOCIAL HISTORY:  reports that he has quit smoking. His smoking use included Cigarettes. He smoked  0.00 packs per day. He has never used smokeless tobacco. He reports that he does not drink alcohol or use illicit drugs.  Review of Systems:   Bolds are positive  Constitutional: weight loss, gain, night sweats, Fevers, chills, fatigue .  HEENT: headaches, Sore throat, sneezing, nasal congestion, post nasal drip, Difficulty swallowing, Tooth/dental problems, visual complaints visual changes, ear ache CV:  chest pain, radiates: ,Orthopnea, PND, swelling in lower extremities, dizziness, palpitations, syncope.  GI  heartburn, indigestion, abdominal pain, nausea, vomiting, diarrhea, x 1 but did take MOM, change in bowel habits, loss of appetite, bloody stools.  Resp: cough, productive: , hemoptysis, dyspnea, chest pain, pleuritic.  Skin: rash or itching or icterus GU: dysuria, change in color of urine, urgency or frequency. flank pain, hematuria  MS: joint pain or swelling. decreased range of motion  Psych: change in mood or affect. depression or anxiety.  Neuro: difficulty with speech, weakness, numbness, ataxia    SUBJECTIVE:  Feels better VITAL SIGNS: Temp:  [97.5 F (36.4 C)] 97.5 F (36.4 C) (02/10 0153) Pulse Rate:  [69-75] 69 (02/10 0300) Resp:  [17] 17 (02/10 0300) BP: (70-107)/(40-59) 107/45 mmHg (02/10 0300) SpO2:  [98 %] 98 % (02/10 0300) Weight:  [97.523 kg (215 lb)] 97.523 kg (215 lb) (02/10 0153) HEMODYNAMICS:   VENTILATOR SETTINGS:   INTAKE / OUTPUT: Intake/Output   None     PHYSICAL EXAMINATION: General:  No acute distress Neuro:  No focal def  HEENT:  [place], no JVD  Cardiovascular:  Rrr, life vest in place  Lungs:  Clear  Abdomen:  Soft, non-tender  Musculoskeletal:  Intact  Skin:  Dry, intact   LABS:  CBC  Recent Labs Lab 04/16/13 0230 04/17/13 0453 04/21/13 0147 04/21/13 0152  WBC 4.6 4.0 7.4  --   HGB 13.0 12.3* 14.9 17.0  HCT 39.3 36.9* 43.2 50.0  PLT 168 164 256  --    Coag's No results found for this basename: APTT, INR,  in the last 168  hours BMET  Recent Labs Lab 04/15/13 0228 04/16/13 0230 04/17/13 1010 04/21/13 0152  NA 142 143 138 139  K 3.6* 3.5* 3.3* 4.3  CL 102 98 95* 98  CO2 25 26 24   --   BUN 14 16 19  35*  CREATININE 0.95 1.11 1.09 2.30*  GLUCOSE 138* 196* 241* 262*   Electrolytes  Recent Labs Lab 04/15/13 0228 04/16/13 0230 04/17/13 1010  CALCIUM 8.5 8.7 8.7   Sepsis Markers  Recent Labs Lab 04/21/13 0153  LATICACIDVEN 3.74*   ABG No results found for this basename: PHART, PCO2ART, PO2ART,  in the last 168 hours Liver Enzymes  Recent Labs Lab 04/16/13 0230  AST 19  ALT 22  ALKPHOS 56  BILITOT 1.1  ALBUMIN 3.7   Cardiac Enzymes  Recent Labs Lab 04/17/13 1010  PROBNP 782.6*   Glucose  Recent Labs Lab 04/15/13 0809 04/15/13 1202 04/15/13 1337 04/17/13 1122 04/17/13 1618  GLUCAP 110* 163* 156* 171* 209*    Imaging Ct Abdomen Pelvis Wo Contrast  04/21/2013   CLINICAL DATA:  Abdominal pain.  Recent median sternotomy for CABG.  EXAM: CT CHEST, ABDOMEN AND PELVIS WITHOUT CONTRAST  TECHNIQUE: Multidetector CT imaging of the chest, abdomen and pelvis was performed following the standard protocol without IV contrast.  COMPARISON:  None.  FINDINGS: CT CHEST FINDINGS  Dependent atelectasis is present in the lungs. Lingular scarring. Postoperative changes of CABG are present. Residual stranding is present in the anterior mediastinum. Sternal wires remain intact and the median sternotomy defect appears within normal limits for this timeframe after operation. Mild cardiomegaly is present. There is no axillary adenopathy. No mediastinal or hilar adenopathy. No aggressive osseous lesions in the chest.  CT ABDOMEN AND PELVIS FINDINGS  Liver: Tiny low-density lesion in the inferior right hepatic lobe probably represents a cyst. Larger lesion in the right hepatic dome measures 18 mm (image 47 series 2), also probably a cyst.  Small amount of ascites is present around the liver and spleen.   Spleen:  Normal.  Gallbladder:  Biliary sludge with high density material in the neck.  Common bile duct:  Normal.  Pancreas:  Normal.  Adrenal glands: Thickening bilaterally, and most compatible with hyperplasia.  Kidneys: Vascular calcifications. No obstruction/hydronephrosis. Both ureters appear normal.  Stomach:  Collapsed.  Grossly normal.  Small bowel: Enteritis is present in the anatomic pelvis. There is no bowel obstruction. Stranding surrounds loops of small bowel and a small amount of ascites is present around pelvic small bowel as well. Stranding extends into the mesentery. There is no pneumatosis.  Colon: Fluid is present within the colon, abnormal but nonspecific. No colonic mural thickening is identified.  Pelvic Genitourinary: Collapsed urinary bladder. Prostate calcifications. Fluid in the anatomic pelvis is low to intermediate attenuation and probably represents minimally complex ascites. No pelvic abscess.  Bones: No aggressive osseous lesions. Severe left hip osteoarthritis, which appears secondary to either AVN or developmental hip dysplasia. Heterotopic bone adjacent to the left hip. 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  Vasculature: Atherosclerosis without an acute vascular abnormality.  Body Wall: Stranding is present in both inguinal regions compatible with recent vascular access. No fluid collection/abscess. No retroperitoneal hemorrhage.  IMPRESSION: 1. Thickening of small bowel loops in the anatomic pelvis compatible with enteritis. Differential considerations are inflammatory bowel disease, infection, and ischemia. No pneumatosis. 2. Small amount of ascites, with intermediate attenuation in the pelvis suggesting debris or complex ascites. 3. Postoperative changes compatible with recent CABG and bilateral femoral artery access. 4. Fluid levels within the colon are probably related this small bowel process. 5. These results were called by telephone at the time  of interpretation on 04/21/2013 at  3:18 AM to Dr. Deanna Artis , who verbally acknowledged these results.   Electronically Signed   By: Andreas Newport M.D.   On: 04/21/2013 03:19   Ct Chest Wo Contrast  04/21/2013   CLINICAL DATA:  Abdominal pain.  Recent median sternotomy for CABG.  EXAM: CT CHEST, ABDOMEN AND PELVIS WITHOUT CONTRAST  TECHNIQUE: Multidetector CT imaging of the chest, abdomen and pelvis was performed following the standard protocol without IV contrast.  COMPARISON:  None.  FINDINGS: CT CHEST FINDINGS  Dependent atelectasis is present in the lungs. Lingular scarring. Postoperative changes of CABG are present. Residual stranding is present in the anterior mediastinum. Sternal wires remain intact and the median sternotomy defect appears within normal limits for this timeframe after operation. Mild cardiomegaly is present. There is no axillary adenopathy. No mediastinal or hilar adenopathy. No aggressive osseous lesions in the chest.  CT ABDOMEN AND PELVIS FINDINGS  Liver: Tiny low-density lesion in the inferior right hepatic lobe probably represents a cyst. Larger lesion in the right hepatic dome measures 18 mm (image 47 series 2), also probably a cyst.  Small amount of ascites is present around the liver and spleen.  Spleen:  Normal.  Gallbladder:  Biliary sludge with high density material in the neck.  Common bile duct:  Normal.  Pancreas:  Normal.  Adrenal glands: Thickening bilaterally, and most compatible with hyperplasia.  Kidneys: Vascular calcifications. No obstruction/hydronephrosis. Both ureters appear normal.  Stomach:  Collapsed.  Grossly normal.  Small bowel: Enteritis is present in the anatomic pelvis. There is no bowel obstruction. Stranding surrounds loops of small bowel and a small amount of ascites is present around pelvic small bowel as well. Stranding extends into the mesentery. There is no pneumatosis.  Colon: Fluid is present within the colon, abnormal but nonspecific.  No colonic mural thickening is identified.  Pelvic Genitourinary: Collapsed urinary bladder. Prostate calcifications. Fluid in the anatomic pelvis is low to intermediate attenuation and probably represents minimally complex ascites. No pelvic abscess.  Bones: No aggressive osseous lesions. Severe left hip osteoarthritis, which appears secondary to either AVN or developmental hip dysplasia. Heterotopic bone adjacent to the left hip. 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  Vasculature: Atherosclerosis without an acute vascular abnormality.  Body Wall: Stranding is present in both inguinal regions compatible with recent vascular access. No fluid collection/abscess. No retroperitoneal hemorrhage.  IMPRESSION: 1. Thickening of small bowel loops in the anatomic pelvis compatible with enteritis. Differential considerations are inflammatory bowel disease, infection, and ischemia. No pneumatosis. 2. Small amount of ascites, with intermediate attenuation in the pelvis suggesting debris or complex ascites. 3. Postoperative changes compatible with recent CABG and bilateral femoral artery access. 4. Fluid levels within the colon are probably related this small bowel process. 5. These results were called by telephone at the time of interpretation on 04/21/2013 at 3:18 AM to Dr. Deanna Artis , who verbally acknowledged these results.   Electronically Signed   By: Andreas Newport M.D.   On: 04/21/2013 03:19     CXR: none  ASSESSMENT / PLAN:  PULMONARY A: h/o OSA  Not currently on CPAP due to damaged device  P:   Pulse ox Wean fio2 Has f/u w/ Dr Maple Hudson for this as new referral.   CARDIOVASCULAR A: Severe CAD s/p recent PCI to LIMA-LAD anastomosis, as well as proximal and distal RCA (Impella supported) on 2/3. ICM wEF 20%     Hypotension (resolved w/ IVFs)  P:  Admit to sdu  Cycle CEs Hold antihypertensives for now -coreg, ramipril.  Gentle hydration  Cont asa and brilinta Can  hold life vest while in hospital  Rpt lactate -resolved, doubt need for CVL or pressors although BP soft  RENAL A:   AKI. There was note of borderline hypotension on d/c. Suspect this is related to hypotension and ace-I  P:   Hold antihypertensives Gentle hydration  F/u chemistry   GASTROINTESTINAL A:   abd pain/ N/V CT abd concerning for enteritis. Unclear if this is infectious or possibly ischemic given recent Impella procedure which would increase risk of embolic related injuries  P:   Avoid hypotension Cont brilinta given concern for embolic injuries  Ck cdiff PCR for completion Add empiric flagyl   HEMATOLOGIC A:  No acute  P:  Trend   INFECTIOUS A:  R/o infectious enteritis  P:   Ck cdiff Add empiric flagyl /zosyn Ck PCT  ENDOCRINE A:  DM w/ hyperglycemia  P:   ssi   NEUROLOGIC A:  No acute P:   Supportive care   TODAY'S SUMMARY: has responded to IVFs. Has been on asa and brilinta so embolic injury this far out seems unlikely but still possible. Given recent hospital stay would be more worried about evolving Cdiff.  Lactate resolution good sign, doubt need for CVL or pressors   Oretha MilchALVA,RAKESH V.  Pulmonary and Critical Care Medicine [facility] Pager: [phone]  04/21/2013, 3:22 AM

## 2013-04-21 NOTE — Progress Notes (Signed)
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 Recently admitted by Cardiology on 2/3 for PCI and stent to LIMA-LAD as well as Impella w/ PCI and stent to RCA. Discharged to home on 2/6 on brilinta, aspirin, ace I, bb, and life vest. Presented on 2/9 w/ 1 day h/o cramping abd pain primarily over LLQ, N/V and orthostatic hypotension. CT abd showed: Thickening of small bowel loops in the anatomic pelvis compatible with enteritis. PCCM was asked to admit given the findings of hypotension, and new AKI.   SUBJECTIVE: pt report he is feeling better today. Afebrile  VITAL SIGNS: Temp:  [97.5 F (36.4 C)] 97.5 F (36.4 C) (02/10 0153) Pulse Rate:  [62-75] 62 (02/10 0545) Resp:  [13-19] 13 (02/10 0545) BP: (70-126)/(40-63) 95/49 mmHg (02/10 0545) SpO2:  [97 %-100 %] 98 % (02/10 0545) Weight:  [215 lb (97.523 kg)-240 lb 4.8 oz (109 kg)] 240 lb 4.8 oz (109 kg) (02/10 0500) HEMODYNAMICS:   VENTILATOR SETTINGS:   INTAKE / OUTPUT: Intake/Output   None    SIGNIFICANT EVENTS / STUDIES:  CT abd pelvis 2/10: Thickening of small bowel loops in the anatomic pelvis compatible with enteritis. Differential considerations are inflammatory bowel disease, infection, and ischemia. No pneumatosis.  CT chest 2/10: Dependent atelectasis is present in the lungs. Lingular scarring. Postoperative changes of CABG are present. Residual stranding is present in the anterior mediastinum. Sternal wires remain intact and  the median sternotomy defect appears within normal limits for this timeframe after operation.   LINES / TUBES:  PIV  CULTURES:  cdiff 2/10>>>   ANTIBIOTICS:  Flagyl 2/10>>>  PHYSICAL  EXAMINATION: General: No acute distress  Neuro: No focal def  HEENT: Pollock, no JVD  Cardiovascular: Rrr, no murmur Lungs: Clear  Abdomen: Soft, non-tender  Musculoskeletal: Intact  Skin: Dry, intact   LABS:  CBC  Recent Labs Lab 04/17/13 0453 04/21/13 0147 04/21/13 0152 04/21/13 0521  WBC 4.0 7.4  --  6.0  HGB 12.3* 14.9 17.0 12.8*  HCT 36.9* 43.2 50.0 37.9*  PLT 164 256  --  218   Coag's No results found for this basename: APTT, INR,  in the last 168 hours BMET  Recent Labs Lab 04/16/13 0230 04/17/13 1010 04/21/13 0152 04/21/13 0521  NA 143 138 139 143  K 3.5* 3.3* 4.3 4.5  CL 98 95* 98 104  CO2 26 24  --  23  BUN 16 19 35* 35*  CREATININE 1.11 1.09 2.30* 1.98*  GLUCOSE 196* 241* 262* 203*   Electrolytes  Recent Labs Lab 04/16/13 0230 04/17/13 1010 04/21/13 0521  CALCIUM 8.7 8.7 8.1*  MG  --   --  1.7  PHOS  --   --  4.7*   Sepsis Markers  Recent Labs Lab 04/21/13 0153 04/21/13 0610  LATICACIDVEN 3.74* 0.2*   ABG No results found for this basename: PHART, PCO2ART, PO2ART,  in the last 168 hours Liver Enzymes  Recent Labs Lab 04/16/13 0230  AST 19  ALT 22  ALKPHOS 56  BILITOT 1.1  ALBUMIN 3.7   Cardiac Enzymes  Recent Labs Lab 04/17/13 1010 04/21/13 0521  TROPONINI  --  <0.30  PROBNP 782.6*  --  Glucose  Recent Labs Lab 04/15/13 0809 04/15/13 1202 04/15/13 1337 04/17/13 1122 04/17/13 1618  GLUCAP 110* 163* 156* 171* 209*    Imaging Ct Abdomen Pelvis Wo Contrast  04/21/2013   CLINICAL DATA:  Abdominal pain.  Recent median sternotomy for CABG.  EXAM: CT CHEST, ABDOMEN AND PELVIS WITHOUT CONTRAST  TECHNIQUE: Multidetector CT imaging of the chest, abdomen and pelvis was performed following the standard protocol without IV contrast.  COMPARISON:  None.  FINDINGS: CT CHEST FINDINGS  Dependent atelectasis is present in the lungs. Lingular scarring. Postoperative changes of CABG are present. Residual stranding is present in the  anterior mediastinum. Sternal wires remain intact and the median sternotomy defect appears within normal limits for this timeframe after operation. Mild cardiomegaly is present. There is no axillary adenopathy. No mediastinal or hilar adenopathy. No aggressive osseous lesions in the chest.  CT ABDOMEN AND PELVIS FINDINGS  Liver: Tiny low-density lesion in the inferior right hepatic lobe probably represents a cyst. Larger lesion in the right hepatic dome measures 18 mm (image 47 series 2), also probably a cyst.  Small amount of ascites is present around the liver and spleen.  Spleen:  Normal.  Gallbladder:  Biliary sludge with high density material in the neck.  Common bile duct:  Normal.  Pancreas:  Normal.  Adrenal glands: Thickening bilaterally, and most compatible with hyperplasia.  Kidneys: Vascular calcifications. No obstruction/hydronephrosis. Both ureters appear normal.  Stomach:  Collapsed.  Grossly normal.  Small bowel: Enteritis is present in the anatomic pelvis. There is no bowel obstruction. Stranding surrounds loops of small bowel and a small amount of ascites is present around pelvic small bowel as well. Stranding extends into the mesentery. There is no pneumatosis.  Colon: Fluid is present within the colon, abnormal but nonspecific. No colonic mural thickening is identified.  Pelvic Genitourinary: Collapsed urinary bladder. Prostate calcifications. Fluid in the anatomic pelvis is low to intermediate attenuation and probably represents minimally complex ascites. No pelvic abscess.  Bones: No aggressive osseous lesions. Severe left hip osteoarthritis, which appears secondary to either AVN or developmental hip dysplasia. Heterotopic bone adjacent to the left hip. 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  Vasculature: Atherosclerosis without an acute vascular abnormality.  Body Wall: Stranding is present in both inguinal regions compatible with recent vascular access. No fluid  collection/abscess. No retroperitoneal hemorrhage.  IMPRESSION: 1. Thickening of small bowel loops in the anatomic pelvis compatible with enteritis. Differential considerations are inflammatory bowel disease, infection, and ischemia. No pneumatosis. 2. Small amount of ascites, with intermediate attenuation in the pelvis suggesting debris or complex ascites. 3. Postoperative changes compatible with recent CABG and bilateral femoral artery access. 4. Fluid levels within the colon are probably related this small bowel process. 5. These results were called by telephone at the time of interpretation on 04/21/2013 at 3:18 AM to Dr. Deanna ArtisAPRIL PALUMBO-RASCH , who verbally acknowledged these results.   Electronically Signed   By: Andreas NewportGeoffrey  Lamke M.D.   On: 04/21/2013 03:19   Ct Chest Wo Contrast  04/21/2013   CLINICAL DATA:  Abdominal pain.  Recent median sternotomy for CABG.  EXAM: CT CHEST, ABDOMEN AND PELVIS WITHOUT CONTRAST  TECHNIQUE: Multidetector CT imaging of the chest, abdomen and pelvis was performed following the standard protocol without IV contrast.  COMPARISON:  None.  FINDINGS: CT CHEST FINDINGS  Dependent atelectasis is present in the lungs. Lingular scarring. Postoperative changes of CABG are present. Residual stranding is present in the anterior mediastinum. Sternal  wires remain intact and the median sternotomy defect appears within normal limits for this timeframe after operation. Mild cardiomegaly is present. There is no axillary adenopathy. No mediastinal or hilar adenopathy. No aggressive osseous lesions in the chest.  CT ABDOMEN AND PELVIS FINDINGS  Liver: Tiny low-density lesion in the inferior right hepatic lobe probably represents a cyst. Larger lesion in the right hepatic dome measures 18 mm (image 47 series 2), also probably a cyst.  Small amount of ascites is present around the liver and spleen.  Spleen:  Normal.  Gallbladder:  Biliary sludge with high density material in the neck.  Common bile  duct:  Normal.  Pancreas:  Normal.  Adrenal glands: Thickening bilaterally, and most compatible with hyperplasia.  Kidneys: Vascular calcifications. No obstruction/hydronephrosis. Both ureters appear normal.  Stomach:  Collapsed.  Grossly normal.  Small bowel: Enteritis is present in the anatomic pelvis. There is no bowel obstruction. Stranding surrounds loops of small bowel and a small amount of ascites is present around pelvic small bowel as well. Stranding extends into the mesentery. There is no pneumatosis.  Colon: Fluid is present within the colon, abnormal but nonspecific. No colonic mural thickening is identified.  Pelvic Genitourinary: Collapsed urinary bladder. Prostate calcifications. Fluid in the anatomic pelvis is low to intermediate attenuation and probably represents minimally complex ascites. No pelvic abscess.  Bones: No aggressive osseous lesions. Severe left hip osteoarthritis, which appears secondary to either AVN or developmental hip dysplasia. Heterotopic bone adjacent to the left hip. 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  Vasculature: Atherosclerosis without an acute vascular abnormality.  Body Wall: Stranding is present in both inguinal regions compatible with recent vascular access. No fluid collection/abscess. No retroperitoneal hemorrhage.  IMPRESSION: 1. Thickening of small bowel loops in the anatomic pelvis compatible with enteritis. Differential considerations are inflammatory bowel disease, infection, and ischemia. No pneumatosis. 2. Small amount of ascites, with intermediate attenuation in the pelvis suggesting debris or complex ascites. 3. Postoperative changes compatible with recent CABG and bilateral femoral artery access. 4. Fluid levels within the colon are probably related this small bowel process. 5. These results were called by telephone at the time of interpretation on 04/21/2013 at 3:18 AM to Dr. Deanna Artis , who verbally acknowledged  these results.   Electronically Signed   By: Andreas Newport M.D.   On: 04/21/2013 03:19   ASSESSMENT / PLAN:  PULMONARY  A: h/o OSA  Not currently on CPAP due to damaged device  P:  - No oxygen requirement. - Has f/u w/ Dr Maple Hudson for this as new referral.  - CPAP while asleep.  CARDIOVASCULAR  A: Severe CAD s/p recent PCI to LIMA-LAD anastomosis, as well as proximal and distal RCA (Impella supported) on 2/3. 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  - Gentle hydration given EF. - Cont asa and brilinta. - Can hold life vest while in hospital.  RENAL  A:  AKI. There was note of borderline hypotension on d/c. Suspect this is related to hypotension and ace-I  P:  - Hold antihypertensives - ACE-I. - Gentle hydration  - Cr baseline normal; 2.38 >>1.98, likely d/ t ace/arb use while dehydrated. - CMET in am.  GASTROINTESTINAL  A:  abd pain/ N/V  CT abd concerning for enteritis. Unclear if this is infectious or possibly ischemic given recent Impella procedure which would increase risk of embolic related injuries  P:  - Avoid hypotension. - Cont brilinta given concern for embolic injuries. - No current diarrhea, C. Diff PCR ordered. - Empiric flagyl.  HEMATOLOGIC  A: No acute  P:  - Trend   INFECTIOUS  A: R/o infectious enteritis  P:  - Lactic acid 3.74>> 0.2 - Procalcitonin <0.10 - cdiff ordered and pending. - Flagyl 2/10.  ENDOCRINE  A: DM w/ hyperglycemia  P:  - SSI. - Add lantus 10 units QHS.  NEUROLOGIC  A: No acute  P:  - Supportive care.  Kuneff, Renee DO 04/21/2013, 8:15 AM  TODAY'S SUMMARY: needs additional slow hydration for soft pressures.  Has been on asa and brilinta so embolic injury this far out seems unlikely but still possible. Given recent hospital stay would be more worried about evolving Cdiff. Lactate improved and procalcitonin is not  concerning.  Get pt up to bedside chair today.  Add lantus and SCDs.  Once BP stabilizes will consider transfer to SDU.  Patient seen and examined, agree with above note.  I dictated the care and orders written for this patient under my direction.  Alyson Reedy, MD [phone]

## 2013-04-21 NOTE — Telephone Encounter (Signed)
[facility]  No answer, no machine at home number.

## 2013-04-21 NOTE — ED Notes (Signed)
Patient complains of nausea and vomiting since Saturday.  He says he took a laxative tonight.  He received 4mg  of zofran with EMS.  Orthostatics were measured by EMS, with pressures trending down.  Original blood pressure was 116/60, and the last reading in route was 77/50.  500mL normal saline bolus was given en route by EMS.  Capillary blood glucose reading was 273.  20 gauge in left ac.

## 2013-04-21 NOTE — ED Notes (Signed)
i-stat CG4+ 3.1974mmol/L result given to Dr. Nicanor AlconPalumbo

## 2013-04-21 NOTE — Consult Note (Signed)
Cardiology Consult Note MASOUD,JAVED, MD No ref. provider found  Reason for consult: Complex CAD hx  History of Present Illness (and review of medical records): Earl Williamson is a 73 y.o. male who presents for evaluation of abdominal pain, nausea, vomiting.  Of note patient was recently discharged from [facility] on 04/20/13.  He has CAD with multiple prior stents with had 3v CABG 11/14 with last known EF 30-35% by 11/14 Echo, hypertension, dyslipidemia,  and T2DM. He was recently admitted with NSTEMI and CHF 2/15 and underwent cardiac cath revealing severe 3v CAD, s/p CABG with total occlusion of the SVG-OM and SVG-PDA and severe stenosis of the LIMA insertion site.  He was not a candidate for re-do CABG and thus he underwent rotational atherectomy and PCI of the RCA, PCI of the LIMA-LAD anastomosis, with hemodynamic support (Impella) by Dr. Excell Seltzer.  Hospitalization was otherwise complicated by code stroke called for near syncope and slurred speech with difficulty finding words. CT of the head without acute process. Neuro consult obtained. MRI/MRAcompleted. Felt to be symptoms of a vasovagal episode yesterday MRI showed small focal defects (new) & occluded RMCA (old).   He now presents with abdominal pain, nausea, and vomiting that began on Sunday.  He began having problems with constipation on Saturday.  He had small, but not normal BM.  He denied any blood in stool.  He had severe abdominal pain that he stated caused him to double over.  Pain was 10/10.  He was unable to keep solids or liquids down.  Denied dysphagia, shortness of breath, or chest pain.  He daughter called the afterhours line and I advised him to come into ED for further evaluation.  Patient was hypotensive on presentation.  EKG revealed prior infarcts and lateral ischemic unchanged from most recent.  Lactic acid was elevated and Cr was elevated (normal on discharge).  He underwent urgent non-contrast CT scan which revealed thickening of small  bowel loops in the anatomic pelvis compatible with enteritis.    Cath: Cath revealed:  1. Severe native 3 vessel CAD  2. S/p CABG with total occlusion of the SVG-OM and SVG-PDA and severe stenosis of the LIMA insertion site  3. Severe LV dysfunction  4. Patent abdominal aorta and iliac arteries   Procedures: cardiac cath 04/13/13 By Dr. Excell Seltzer.  04/14/13 Intervention X 3 LIMA insertion site, Xience Alpine DES; Mid RCA Promus DES;  And PDA with Promus DES  04/14/13 Impella hemodynamic support device insertion, temporary transvenous pacemaker insertion under fluoroscopic guidance, removed at end of procedure.   2D Echo - Left ventricle: The cavity size was normal. There was severe concentric hypertrophy. Systolic function was moderately to severely reduced. The estimated ejection fraction was in the range of 30% to 35%. Diffuse hypokinesis. Doppler parameters are consistent with abnormal left ventricular relaxation (grade 1 diastolic dysfunction). - Left atrium: The atrium was mildly dilated. - Right ventricle: The cavity size was mildly dilated. Wall thickness was normal.   Review of Systems Further review of systems was otherwise negative other than stated in HPI.  Patient Active Problem List   Diagnosis Date Noted  . Abdominal pain 04/21/2013  . At risk for sudden cardiac death, EF 30-35%, d/c'd with lifevest 04/17/2013  . Altered mental status, secondary to embolic matter at cath 04/15/2013  . Chest pain 04/11/2013  . Acute on chronic heart failure 04/11/2013  . Obstructive sleep apnea   . Cardiomyopathy, ischemic, EF 30-35%, 01/27/13 - now roughly 20% by LV gram February  2015 02/26/2013  . Obesity, Class II, BMI 35-39.9, with comorbidity 02/26/2013  . S/P CABG x 3 01/30/2013  . NSTEMI (non-ST elevated myocardial infarction), 2015 02/07/13  . CAD S/P percutaneous coronary angioplasty - PCI  x 3 stents @ [facility]; CABG x 3 01/30/13 ; PCI to LIMA-LAD anastomosis, as well as proximal  and distal RCA (Impella supported. 02/07/13  . DM type 2 (diabetes mellitus, type 2) 02-07-13  . HTN (hypertension), benign 02-07-13  . HLD (hyperlipidemia) 02/07/2013   Past Medical History  Diagnosis Date  . ST elevation myocardial infarction (STEMI) of inferoposterior wall, subsequent episode of care 2005    [facility]) - 100% RCA -- DES; also mCx & LAD lesions  . CAD S/P percutaneous coronary angioplasty 2005    [facility] -- DES to pRCA (culprit for STEMI) - staged DES to mCx & mLAD  . Non-ST elevation myocardial infarction (NSTEMI), subsequent episode of care 01/2013    MultiVessel CAD --> CABG  . CAD in native artery 01/2013    Cath: 50% LAD ISR with 95% post-stent; prox OM1 95%; pRCA stent ~70% ISR, mRCA 90% & 60%, rPDA 99% --> CABG  . 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 Maren Beach -- LIMA-LAD, SVG-OM 1, SVG-RPDA  . NSTEMI (non-ST elevated myocardial infarction) 04/12/2013  . CAD (coronary artery disease) of bypass graft 04/13/2013    SVG-OM & SVG-RCA occluded; 99% anastomotic lesion in LIMA-LAD  . S/P primary angioplasty with coronary stent 04/14/2013    Complex Impella protected ROTAblator PCI to m & dRCA-PDA, PCI to LIMA-LAD  . 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 At risk for sudden cardiac death - EF now ~25% 02-07-13  . TIA 04/15/2013    MRI of brain suggested "incidental finding of several small microembolic CVA  . Diabetes mellitus type 2 in obese     Oral medications  . Obesity, Class II, BMI 35-39.9, with comorbidity   . OSA on CPAP     needs CPAP titration  . Hypertension, essential   . Hyperlipidemia LDL goal < 70     Past Surgical History  Procedure Laterality Date  . Joint replacement    . Coronary stent placement  2005    DES to proximal RCA, mid circumflex and mid LAD in the setting of STEMI  . Coronary artery bypass graft N/A 01/30/2013    Procedure: CORONARY ARTERY BYPASS GRAFTING (CABG);  Surgeon: Kerin Perna, MD;  Location: [facility] OR;  Service: Open Heart Surgery;  Laterality: N/A;  . Intraoperative transesophageal echocardiogram N/A 01/30/2013    Procedure: INTRAOPERATIVE TRANSESOPHAGEAL ECHOCARDIOGRAM;  Surgeon: Kerin Perna, MD;  Location: [facility] OR;  Service: Open Heart Surgery;  Laterality: N/A;  . Transthoracic echocardiogram  01/27/2013    EF 30-35%. Mildly dilated LV with mild concentric LVH.  Apical dyskinesis with severe HK of mid-distal anterolateral/septal and inferoseptal myocardium; grade 2 diastolic function  . Coronary angioplasty with stent placement  04/15/2013    Impella supported PCI to LIMA-LAD anastomosis, proximal-mid RCA Rotablator guided PCI of the distal and proximal RCA and PDA PTCA  . Cardiac catheterization  04/13/13    Current Facility-Administered Medications  Medication Dose Route Frequency Provider Last Rate Last Dose  . 0.9 %  sodium chloride infusion  250 mL Intravenous PRN Simonne Martinet, NP      . 0.9 %  sodium chloride infusion   Intravenous Continuous Antionette Poles  Tanja PortBabcock, NP 100 mL/hr at 04/21/13 0519    . aspirin EC tablet 81 mg  81 mg Oral Daily Simonne MartinetPeter E Babcock, NP      . insulin aspart (novoLOG) injection 0-15 Units  0-15 Units Subcutaneous TID WC Simonne MartinetPeter E Babcock, NP      . metroNIDAZOLE (FLAGYL) tablet 500 mg  500 mg Oral Q8H Simonne MartinetPeter E Babcock, NP      . pantoprazole (PROTONIX) injection 40 mg  40 mg Intravenous QHS Simonne MartinetPeter E Babcock, NP      . simvastatin (ZOCOR) tablet 40 mg  40 mg Oral Daily Simonne MartinetPeter E Babcock, NP      . Ticagrelor (BRILINTA) tablet 90 mg  90 mg Oral BID Simonne MartinetPeter E Babcock, NP       Current Outpatient Prescriptions  Medication Sig Dispense Refill  . acetaminophen (TYLENOL) 325 MG tablet Take 2 tablets (650 mg total) by mouth every 4 (four) hours as needed for headache or mild pain.      Marland Kitchen. aspirin EC 81 MG EC tablet Take 1 tablet (81 mg total) by mouth daily.      . carvedilol (COREG) 12.5 MG tablet Take 1 tablet (12.5 mg total) by mouth 2 (two)  times daily with a meal.  60 tablet  6  . docusate sodium (COLACE) 100 MG capsule Take 100 mg by mouth daily as needed for mild constipation.      . furosemide (LASIX) 80 MG tablet Take 1 tablet (80 mg total) by mouth 2 (two) times daily.  60 tablet  6  . glimepiride (AMARYL) 2 MG tablet Take 1 tablet (2 mg total) by mouth daily with breakfast.  30 tablet  3  . metFORMIN (GLUCOPHAGE) 500 MG tablet Take 500 mg by mouth 2 (two) times daily with a meal.      . metoprolol (LOPRESSOR) 50 MG tablet Take 1 tablet (50 mg total) by mouth 2 (two) times daily.  60 tablet  6  . nitroGLYCERIN (NITROSTAT) 0.4 MG SL tablet Place 1 tablet (0.4 mg total) under the tongue every 5 (five) minutes as needed for chest pain.  25 tablet  4  . oxyCODONE-acetaminophen (PERCOCET/ROXICET) 5-325 MG per tablet Take 1 tablet by mouth 2 (two) times daily as needed for severe pain.      . pantoprazole (PROTONIX) 40 MG tablet Take 1 tablet (40 mg total) by mouth daily.  90 tablet  3  . potassium chloride (K-DUR) 10 MEQ tablet Take 1 tablet (10 mEq total) by mouth 2 (two) times daily.  60 tablet  6  . ramipril (ALTACE) 10 MG capsule Take 1 capsule (10 mg total) by mouth daily.  90 capsule  3  . simethicone (GAS-X EXTRA STRENGTH) 125 MG chewable tablet Chew 125 mg by mouth every 6 (six) hours as needed for flatulence.       . simvastatin (ZOCOR) 40 MG tablet Take 40 mg by mouth daily.      Marland Kitchen. spironolactone (ALDACTONE) 12.5 mg TABS tablet Take 0.5 tablets (12.5 mg total) by mouth 2 (two) times daily.  30 tablet  6  . Ticagrelor (BRILINTA) 90 MG TABS tablet Take 1 tablet (90 mg total) by mouth 2 (two) times daily.  60 tablet  11  . traMADol (ULTRAM) 50 MG tablet Take by mouth every 12 (twelve) hours as needed.        No Known Allergies  History  Substance Use Topics  . Smoking status: Former Smoker    Types: Cigarettes  .  Smokeless tobacco: Never Used  . Alcohol Use: No    Family History  Problem Relation Age of Onset  .  Heart attack Mother   . Heart attack Sister   . Heart attack Brother      Objective:  Patient Vitals for the past 8 hrs:  BP Temp Temp src Pulse Resp SpO2 Height Weight  04/21/13 0345 100/55 mmHg - - 67 15 98 % - -  04/21/13 0330 105/49 mmHg - - 68 14 99 % - -  04/21/13 0315 126/63 mmHg - - 73 18 99 % - -  04/21/13 0300 107/45 mmHg - - 69 17 98 % - -  04/21/13 0236 70/50 mmHg - - - - - - -  04/21/13 0230 87/59 mmHg - - - - - - -  04/21/13 0227 72/40 mmHg - - - - - - -  04/21/13 0153 84/53 mmHg 97.5 F (36.4 C) Oral 75 - 98 % 5\' 6"  (1.676 m) 97.523 kg (215 lb)   General appearance: alert, cooperative, elderly appearing make, obese, in NAD Head: Normocephalic, without obvious abnormality, atraumatic Eyes: conjunctivae/corneas clear. PERRL, EOM's intact. Fundi benign. Neck: supple Lungs: clear to auscultation bilaterally Chest wall: no tenderness Heart: regular rate and rhythm, S1, S2 normal,  Abdomen: soft, diffuse tender, no rebound or guarding, distended Extremities: extremities normal, atraumatic, BLE edema Pulses: 2+ and symmetric Neurologic: Grossly normal  Results for orders placed during the hospital encounter of 04/21/13 (from the past 48 hour(s))  CBC WITH DIFFERENTIAL     Status: None   Collection Time    04/21/13  1:47 AM      Result Value Range   WBC 7.4  4.0 - 10.5 K/uL   RBC 5.33  4.22 - 5.81 MIL/uL   Hemoglobin 14.9  13.0 - 17.0 g/dL   HCT 16.1  09.6 - 04.5 %   MCV 81.1  78.0 - 100.0 fL   MCH 28.0  26.0 - 34.0 pg   MCHC 34.5  30.0 - 36.0 g/dL   RDW 40.9  81.1 - 91.4 %   Platelets 256  150 - 400 K/uL   Neutrophils Relative % 69  43 - 77 %   Neutro Abs 5.1  1.7 - 7.7 K/uL   Lymphocytes Relative 16  12 - 46 %   Lymphs Abs 1.2  0.7 - 4.0 K/uL   Monocytes Relative 12  3 - 12 %   Monocytes Absolute 0.9  0.1 - 1.0 K/uL   Eosinophils Relative 2  0 - 5 %   Eosinophils Absolute 0.2  0.0 - 0.7 K/uL   Basophils Relative 0  0 - 1 %   Basophils Absolute 0.0  0.0 -  0.1 K/uL  POCT I-STAT, CHEM 8     Status: Abnormal   Collection Time    04/21/13  1:52 AM      Result Value Range   Sodium 139  137 - 147 mEq/L   Potassium 4.3  3.7 - 5.3 mEq/L   Chloride 98  96 - 112 mEq/L   BUN 35 (*) 6 - 23 mg/dL   Creatinine, Ser 7.82 (*) 0.50 - 1.35 mg/dL   Glucose, Bld 956 (*) 70 - 99 mg/dL   Calcium, Ion 2.13 (*) 1.13 - 1.30 mmol/L   TCO2 27  0 - 100 mmol/L   Hemoglobin 17.0  13.0 - 17.0 g/dL   HCT 08.6  57.8 - 46.9 %  POCT I-STAT TROPONIN I  Status: None   Collection Time    04/21/13  1:52 AM      Result Value Range   Troponin i, poc 0.04  0.00 - 0.08 ng/mL   Comment 3            Comment: Due to the release kinetics of cTnI,     a negative result within the first hours     of the onset of symptoms does not rule out     myocardial infarction with certainty.     If myocardial infarction is still suspected,     repeat the test at appropriate intervals.  CG4 I-STAT (LACTIC ACID)     Status: Abnormal   Collection Time    04/21/13  1:53 AM      Result Value Range   Lactic Acid, Venous 3.74 (*) 0.5 - 2.2 mmol/L  URINALYSIS, ROUTINE W REFLEX MICROSCOPIC     Status: Abnormal   Collection Time    04/21/13  2:29 AM      Result Value Range   Color, Urine AMBER (*) YELLOW   Comment: BIOCHEMICALS MAY BE AFFECTED BY COLOR   APPearance CLOUDY (*) CLEAR   Specific Gravity, Urine 1.027  1.005 - 1.030   pH 5.0  5.0 - 8.0   Glucose, UA 100 (*) NEGATIVE mg/dL   Hgb urine dipstick NEGATIVE  NEGATIVE   Bilirubin Urine SMALL (*) NEGATIVE   Ketones, ur 15 (*) NEGATIVE mg/dL   Protein, ur NEGATIVE  NEGATIVE mg/dL   Urobilinogen, UA 0.2  0.0 - 1.0 mg/dL   Nitrite NEGATIVE  NEGATIVE   Leukocytes, UA NEGATIVE  NEGATIVE   Comment: MICROSCOPIC NOT DONE ON URINES WITH NEGATIVE PROTEIN, BLOOD, LEUKOCYTES, NITRITE, OR GLUCOSE <1000 mg/dL.  TYPE AND SCREEN     Status: None   Collection Time    04/21/13  3:20 AM      Result Value Range   ABO/RH(D) O POS     Antibody  Screen NEG     Sample Expiration 04/24/2013     Ct Abdomen Pelvis Wo Contrast  04/21/2013   CLINICAL DATA:  Abdominal pain.  Recent median sternotomy for CABG.  EXAM: CT CHEST, ABDOMEN AND PELVIS WITHOUT CONTRAST  TECHNIQUE: Multidetector CT imaging of the chest, abdomen and pelvis was performed following the standard protocol without IV contrast.  COMPARISON:  None.  FINDINGS: CT CHEST FINDINGS  Dependent atelectasis is present in the lungs. Lingular scarring. Postoperative changes of CABG are present. Residual stranding is present in the anterior mediastinum. Sternal wires remain intact and the median sternotomy defect appears within normal limits for this timeframe after operation. Mild cardiomegaly is present. There is no axillary adenopathy. No mediastinal or hilar adenopathy. No aggressive osseous lesions in the chest.  CT ABDOMEN AND PELVIS FINDINGS  Liver: Tiny low-density lesion in the inferior right hepatic lobe probably represents a cyst. Larger lesion in the right hepatic dome measures 18 mm (image 47 series 2), also probably a cyst.  Small amount of ascites is present around the liver and spleen.  Spleen:  Normal.  Gallbladder:  Biliary sludge with high density material in the neck.  Common bile duct:  Normal.  Pancreas:  Normal.  Adrenal glands: Thickening bilaterally, and most compatible with hyperplasia.  Kidneys: Vascular calcifications. No obstruction/hydronephrosis. Both ureters appear normal.  Stomach:  Collapsed.  Grossly normal.  Small bowel: Enteritis is present in the anatomic pelvis. There is no bowel obstruction. Stranding surrounds loops of small bowel and a  small amount of ascites is present around pelvic small bowel as well. Stranding extends into the mesentery. There is no pneumatosis.  Colon: Fluid is present within the colon, abnormal but nonspecific. No colonic mural thickening is identified.  Pelvic Genitourinary: Collapsed urinary bladder. Prostate calcifications. Fluid in the  anatomic pelvis is low to intermediate attenuation and probably represents minimally complex ascites. No pelvic abscess.  Bones: No aggressive osseous lesions. Severe left hip osteoarthritis, which appears secondary to either AVN or developmental hip dysplasia. Heterotopic bone adjacent to the left hip. 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  Vasculature: Atherosclerosis without an acute vascular abnormality.  Body Wall: Stranding is present in both inguinal regions compatible with recent vascular access. No fluid collection/abscess. No retroperitoneal hemorrhage.  IMPRESSION: 1. Thickening of small bowel loops in the anatomic pelvis compatible with enteritis. Differential considerations are inflammatory bowel disease, infection, and ischemia. No pneumatosis. 2. Small amount of ascites, with intermediate attenuation in the pelvis suggesting debris or complex ascites. 3. Postoperative changes compatible with recent CABG and bilateral femoral artery access. 4. Fluid levels within the colon are probably related this small bowel process. 5. These results were called by telephone at the time of interpretation on 04/21/2013 at 3:18 AM to Dr. Deanna Artis , who verbally acknowledged these results.   Electronically Signed   By: Andreas Newport M.D.   On: 04/21/2013 03:19   Ct Chest Wo Contrast  04/21/2013   CLINICAL DATA:  Abdominal pain.  Recent median sternotomy for CABG.  EXAM: CT CHEST, ABDOMEN AND PELVIS WITHOUT CONTRAST  TECHNIQUE: Multidetector CT imaging of the chest, abdomen and pelvis was performed following the standard protocol without IV contrast.  COMPARISON:  None.  FINDINGS: CT CHEST FINDINGS  Dependent atelectasis is present in the lungs. Lingular scarring. Postoperative changes of CABG are present. Residual stranding is present in the anterior mediastinum. Sternal wires remain intact and the median sternotomy defect appears within normal limits for this timeframe after  operation. Mild cardiomegaly is present. There is no axillary adenopathy. No mediastinal or hilar adenopathy. No aggressive osseous lesions in the chest.  CT ABDOMEN AND PELVIS FINDINGS  Liver: Tiny low-density lesion in the inferior right hepatic lobe probably represents a cyst. Larger lesion in the right hepatic dome measures 18 mm (image 47 series 2), also probably a cyst.  Small amount of ascites is present around the liver and spleen.  Spleen:  Normal.  Gallbladder:  Biliary sludge with high density material in the neck.  Common bile duct:  Normal.  Pancreas:  Normal.  Adrenal glands: Thickening bilaterally, and most compatible with hyperplasia.  Kidneys: Vascular calcifications. No obstruction/hydronephrosis. Both ureters appear normal.  Stomach:  Collapsed.  Grossly normal.  Small bowel: Enteritis is present in the anatomic pelvis. There is no bowel obstruction. Stranding surrounds loops of small bowel and a small amount of ascites is present around pelvic small bowel as well. Stranding extends into the mesentery. There is no pneumatosis.  Colon: Fluid is present within the colon, abnormal but nonspecific. No colonic mural thickening is identified.  Pelvic Genitourinary: Collapsed urinary bladder. Prostate calcifications. Fluid in the anatomic pelvis is low to intermediate attenuation and probably represents minimally complex ascites. No pelvic abscess.  Bones: No aggressive osseous lesions. Severe left hip osteoarthritis, which appears secondary to either AVN or developmental hip dysplasia. Heterotopic bone adjacent to the left hip. 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  Vasculature: Atherosclerosis without an acute vascular abnormality.  Body Wall: Stranding is present in both inguinal regions compatible with recent vascular access. No fluid collection/abscess. No retroperitoneal hemorrhage.  IMPRESSION: 1. Thickening of small bowel loops in the anatomic pelvis compatible with  enteritis. Differential considerations are inflammatory bowel disease, infection, and ischemia. No pneumatosis. 2. Small amount of ascites, with intermediate attenuation in the pelvis suggesting debris or complex ascites. 3. Postoperative changes compatible with recent CABG and bilateral femoral artery access. 4. Fluid levels within the colon are probably related this small bowel process. 5. These results were called by telephone at the time of interpretation on 04/21/2013 at 3:18 AM to Dr. Deanna Artis , who verbally acknowledged these results.   Electronically Signed   By: Andreas Newport M.D.   On: 04/21/2013 03:19    ECG:  Sinus rhythm HR 71, anteroseptal infarct old, lateral ST &T wave abnormality consider ischemia, no significant change from most recent  Impression: 23M p/w abdominal pain, nausea, vomiting along with increased lactic acid and acute renal failure and hypotension.  Concern for acute bleeding, sepsis, cardiogenic shock.  Urgent CT was with without contrast given renal failure, however, revealed no air in bowel, but concerning for enteritis.  Maybe most likely related to ischemia, given underlying CAD and recent hospitalization and instrumentation (distal embolization).  Differential also includes infection, retroperitoneal bleed, less likely given CT findings.  Enteritis, suspect ischemic related Acute renal failure Recent NSTEMI (non-ST elevated myocardial infarction) S/P Impella protected PCI of LIMA-LAD and 2 sites in the RCA. Cardiomyopathy, ischemic, EF 30-35%, 01/27/13 - now roughly 20% by LV gram February 2013 (life vest on last discharge) DM type 2 (diabetes mellitus, type 2)  HTN (hypertension), benign  HLD (hyperlipidemia)  Obstructive sleep apnea   Recommendations: -Monitor closely in stepdown for progression/worsening of symptoms requiring surgical exploration. -Aggressive volume resuscitation, -If vasopressors needed would use dobutamine/dopamine -Bowel  rest, NPO -Continue ASA, Brillinta, recent PCI, contact Cardiology if any issues with bleeding or need for surgery -Hold BP meds at this time given hypotension -Discussed with CCM, appreciate their care.  We will follow along with you and make further recommendations as indicated.

## 2013-04-21 NOTE — ED Notes (Signed)
Cardiology at bedside.

## 2013-04-22 ENCOUNTER — Encounter: Payer: Medicare Other | Admitting: Cardiology

## 2013-04-22 ENCOUNTER — Other Ambulatory Visit: Payer: Medicare Other

## 2013-04-22 DIAGNOSIS — R109 Unspecified abdominal pain: Secondary | ICD-10-CM

## 2013-04-22 LAB — CBC
HCT: 31.7 % — ABNORMAL LOW (ref 39.0–52.0)
Hemoglobin: 10.4 g/dL — ABNORMAL LOW (ref 13.0–17.0)
MCH: 26.9 pg (ref 26.0–34.0)
MCHC: 32.8 g/dL (ref 30.0–36.0)
MCV: 81.9 fL (ref 78.0–100.0)
Platelets: 191 10*3/uL (ref 150–400)
RBC: 3.87 MIL/uL — AB (ref 4.22–5.81)
RDW: 14.6 % (ref 11.5–15.5)
WBC: 4 10*3/uL (ref 4.0–10.5)

## 2013-04-22 LAB — BASIC METABOLIC PANEL
BUN: 21 mg/dL (ref 6–23)
CALCIUM: 8.1 mg/dL — AB (ref 8.4–10.5)
CHLORIDE: 105 meq/L (ref 96–112)
CO2: 23 meq/L (ref 19–32)
Creatinine, Ser: 1.14 mg/dL (ref 0.50–1.35)
GFR calc Af Amer: 73 mL/min — ABNORMAL LOW (ref >90)
GFR calc non Af Amer: 63 mL/min — ABNORMAL LOW (ref >90)
GLUCOSE: 91 mg/dL (ref 70–99)
Potassium: 3.8 mEq/L (ref 3.7–5.3)
SODIUM: 141 meq/L (ref 137–147)

## 2013-04-22 LAB — GLUCOSE, CAPILLARY
GLUCOSE-CAPILLARY: 100 mg/dL — AB (ref 70–99)
GLUCOSE-CAPILLARY: 99 mg/dL (ref 70–99)
GLUCOSE-CAPILLARY: 94 mg/dL (ref 70–99)
Glucose-Capillary: 86 mg/dL (ref 70–99)
Glucose-Capillary: 132 mg/dL — ABNORMAL HIGH (ref 70–99)

## 2013-04-22 LAB — CLOSTRIDIUM DIFFICILE BY PCR: Toxigenic C. Difficile by PCR: NEGATIVE

## 2013-04-22 MED ORDER — RANITIDINE HCL 150 MG/10ML PO SYRP
150.0000 mg | ORAL_SOLUTION | Freq: Two times a day (BID) | ORAL | Status: DC
  Administered 2013-04-22 – 2013-04-24 (×4): 150 mg via ORAL
  Filled 2013-04-22 (×5): qty 10

## 2013-04-22 MED ORDER — CARVEDILOL 3.125 MG PO TABS
3.1250 mg | ORAL_TABLET | Freq: Two times a day (BID) | ORAL | Status: DC
  Administered 2013-04-22 – 2013-04-24 (×4): 3.125 mg via ORAL
  Filled 2013-04-22 (×6): qty 1

## 2013-04-22 MED ORDER — HEPARIN SODIUM (PORCINE) 5000 UNIT/ML IJ SOLN
5000.0000 [IU] | Freq: Three times a day (TID) | INTRAMUSCULAR | Status: DC
  Administered 2013-04-22 – 2013-04-24 (×6): 5000 [IU] via SUBCUTANEOUS
  Filled 2013-04-22 (×10): qty 1

## 2013-04-22 MED ORDER — HYDROCODONE-ACETAMINOPHEN 5-325 MG PO TABS
1.0000 | ORAL_TABLET | ORAL | Status: DC | PRN
  Administered 2013-04-22: 1 via ORAL
  Filled 2013-04-22: qty 1

## 2013-04-22 NOTE — Progress Notes (Signed)
    Subjective:  Feeling a little better. Still with some breathlessness - feels like 'can't take a deep breath.' no chest pain or pressure. Was tired after walking around unit this morning.  Objective:  Vital Signs in the last 24 hours: Temp:  [97.8 F (36.6 C)-99.6 F (37.6 C)] 99.6 F (37.6 C) (02/11 1341) Pulse Rate:  [57-89] 60 (02/11 1300) Resp:  [5-23] 12 (02/11 1300) BP: (95-169)/(49-100) 100/58 mmHg (02/11 1300) SpO2:  [98 %-100 %] 99 % (02/11 1300) Weight:  [226 lb 13.7 oz (102.9 kg)] 226 lb 13.7 oz (102.9 kg) (02/11 0500)  Intake/Output from previous day: 02/10 0701 - 02/11 0700 In: 2300 [I.V.:2300] Out: 1750 [Urine:1750]  Physical Exam: Pt is alert and oriented, NAD HEENT: normal Neck: JVP - normal, carotids 2+= without bruits Lungs: CTA bilaterally CV: RRR without murmur or gallop Abd: soft, NT, Positive BS, no hepatomegaly Ext: no C/C/E, distal pulses intact and equal Skin: warm/dry no rash   Lab Results:  Recent Labs  04/21/13 0521 04/22/13 0215  WBC 6.0 4.0  HGB 12.8* 10.4*  PLT 218 191    Recent Labs  04/21/13 0731 04/22/13 0215  NA 144 141  K 4.4 3.8  CL 105 105  CO2 23 23  GLUCOSE 174* 91  BUN 34* 21  CREATININE 1.86* 1.14    Recent Labs  04/21/13 1105 04/21/13 1644  TROPONINI <0.30 <0.30    Tele: Sinus rhythm, occasional PVC's  Assessment/Plan:  1. Acute Kidney Injury - improved with volume, holding ACE/aldactone  2. Enteritis - ? Etiology, possibly ischemic. Overall seems improved  3. Chronic systolic heart failure. Need to add back meds cautiously. Will start back on low-dose coreg today. BP is borderline.  4. CAD, extensive. S/P multivessel PCI. Continue ASA, brilinta. His dyspnea sounds very consistent with brilinta side-effect, but would continue for now as this can improve over the first few weeks of treatment. No good alternatives as Effient contraindicated with hx of stroke and he was a hyporesponder to  plavix.   Tonny BollmanMichael Mikeya Tomasetti, M.D. 04/22/2013, 2:04 PM

## 2013-04-22 NOTE — Progress Notes (Signed)
C/o of chest pain 5/10 , denies nausea or shortness of breath. Pain started after patient transferred from recliner back to bed. bp 165/60, with heart rate 80's with frequent pvc's. Cardiology Corine ShelterLuke Kilroy , [place] made aware. See notes and orders.

## 2013-04-22 NOTE — Progress Notes (Signed)
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 Recently admitted by Cardiology on 2/3 for PCI and stent to LIMA-LAD as well as Impella w/ PCI and stent to RCA. Discharged to home on 2/6 on brilinta, aspirin, ace I, bb, and life vest. Presented on 2/9 w/ 1 day h/o cramping abd pain primarily over LLQ, N/V and orthostatic hypotension. CT abd showed: Thickening of small bowel loops in the anatomic pelvis compatible with enteritis. PCCM was asked to admit given the findings of hypotension, and new AKI.   SIGNIFICANT EVENTS / STUDIES:  CT abd pelvis 2/10: Thickening of small bowel loops in the anatomic pelvis compatible with enteritis. Differential considerations are inflammatory bowel disease, infection, and ischemia. No pneumatosis.  CT chest 2/10: Dependent atelectasis is present in the lungs. Lingular scarring. Postoperative changes of CABG are present. Residual stranding is present in the anterior mediastinum. Sternal wires remain intact and the median sternotomy defect appears within normal limits for this timeframe after operation.   HPI/Subjective: Pt is alert and conversant.  He denies any complaints at this time.  No chest pain, sob, or abdom pain.  He reports to me that he has had abdom cramps randomly "for a year or so now."  He is a poor historian.    Assessment/Plan:  abd pain/ N/V unclear if this is infectious or possibly ischemic - no current diarrhea - C. Diff PCR ordered - empiric flagyl started per PCCM  h/o OSA  Not currently on CPAP due to damaged device - has f/u w/ Dr Maple HudsonYoung for this as new referral  Severe CAD s/p recent PCI to LIMA-LAD anastomosis, as well as proximal and distal RCA (Impella supported) on 2/3. ICM wEF 20% As per Cardiology  AKI Hold  antihypertensives / diuretics   DM  Code Status: FULL Family Communication: no family present at time of exam Disposition Plan: transfer to tele bed   Consultants: Cardiology  Antibiotics: Flagyl 2/10 >  DVT prophylaxis: SCDs + SQ heparin   Objective: Blood pressure 125/65, pulse 76, temperature 99.6 F (37.6 C), temperature source Oral, resp. rate 18, height 5\' 6"  (1.676 m), weight 102.9 kg (226 lb 13.7 oz), SpO2 100.00%.  Intake/Output Summary (Last 24 hours) at 04/22/13 1715 Last data filed at 04/22/13 1700  Gross per 24 hour  Intake   2590 ml  Output   2250 ml  Net    340 ml   Exam: General: No acute respiratory distress Lungs: Clear to auscultation bilaterally without wheezes or crackles Cardiovascular: Regular rate and rhythm without murmur gallop or rub normal S1 and S2 Abdomen: Nontender, nondistended, soft, bowel sounds positive, no rebound, no ascites, no appreciable mass Extremities: No significant cyanosis, clubbing, or edema bilateral lower extremities  Data Reviewed: Basic Metabolic Panel:  Recent Labs Lab 04/16/13 0230 04/17/13 1010 04/21/13 0152 04/21/13 0521 04/21/13 0731 04/22/13 0215  NA 143 138 139 143 144 141  K 3.5* 3.3* 4.3 4.5 4.4 3.8  CL 98 95* 98 104 105 105  CO2 26 24  --  23 23 23   GLUCOSE 196* 241* 262* 203* 174* 91  BUN 16 19 35* 35* 34* 21  CREATININE 1.11 1.09 2.30* 1.98* 1.86* 1.14  CALCIUM 8.7 8.7  --  8.1* 8.1* 8.1*  MG  --   --   --  1.7  --   --   PHOS  --   --   --  4.7*  --   --    Liver Function Tests:  Recent Labs Lab 04/16/13 0230  AST 19  ALT 22  ALKPHOS 56  BILITOT 1.1  PROT 7.3  ALBUMIN 3.7    Recent Labs Lab 04/16/13 0230 04/21/13 0521  LIPASE 32 63*   CBC:  Recent Labs Lab 04/16/13 0230 04/17/13 0453 04/21/13 0147 04/21/13 0152 04/21/13 0521 04/22/13 0215  WBC 4.6 4.0 7.4  --  6.0 4.0  NEUTROABS  --   --  5.1  --   --   --   HGB 13.0 12.3* 14.9 17.0 12.8* 10.4*  HCT 39.3 36.9* 43.2  50.0 37.9* 31.7*  MCV 82.6 81.3 81.1  --  81.3 81.9  PLT 168 164 256  --  218 191   Cardiac Enzymes:  Recent Labs Lab 04/21/13 0521 04/21/13 1105 04/21/13 1644  TROPONINI <0.30 <0.30 <0.30   BNP (last 3 results)  Recent Labs  04/10/13 2217 04/17/13 1010  PROBNP 2362.0* 782.6*   CBG:  Recent Labs Lab 04/21/13 1741 04/21/13 2224 04/22/13 0811 04/22/13 0851 04/22/13 1212  GLUCAP 110* 125* 86 100* 99    No results found for this or any previous visit (from the past 240 hour(s)).   Studies:  Recent x-ray studies have been reviewed in detail by the Attending Physician  Scheduled Meds:  Scheduled Meds: . aspirin EC  81 mg Oral Daily  . carvedilol  3.125 mg Oral BID WC  . insulin aspart  0-15 Units Subcutaneous TID WC  . insulin glargine  10 Units Subcutaneous QHS  . metroNIDAZOLE  500 mg Oral 3 times per day  . pantoprazole (PROTONIX) IV  40 mg Intravenous QHS  . simvastatin  40 mg Oral Daily  . Ticagrelor  90 mg Oral BID    Time spent on care of this patient: 35 mins   [facility] T  [facility] Office  [phone] Pager - Text Page per Loretha Stapler as per below:  On-Call/Text Page:      [email or web address]      password [email or web address]  If 7PM-7AM, please contact night-coverage [email or web address] Password [email or web address] 04/22/2013, 5:15 PM   LOS: 1 day

## 2013-04-22 NOTE — Evaluation (Signed)
Occupational Therapy Evaluation Patient Details Name: Earl Williamson MRN: [id number] DOB: 11-17-1940 Today's Date: 04/22/2013 Time: 4098-1191 OT Time Calculation (min): 24 min  OT Assessment / Plan / Recommendation History of present illness Adm with abd pain, N/V, dehydration, hypotension. Recent history includes CABG 01/2013. 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 EF down to 20-25% and discharged home with Lifevest.   Clinical Impression   Pt performing ADLs and ADL transfers at a modified independent level.  Instructed in energy conservation and gradual increase of time walking to build endurance. Pt verbalized understanding. No further OT needs.    OT Assessment  Patient does not need any further OT services    Follow Up Recommendations  No OT follow up    Barriers to Discharge      Equipment Recommendations       Recommendations for Other Services    Frequency       Precautions / Restrictions Precautions Precautions: Other (comment) (Lifevest) Precaution Comments: Lifevest   Pertinent Vitals/Pain Lifevest, VSS, no pain    ADL  Eating/Feeding: Independent Where Assessed - Eating/Feeding: Chair Grooming: Wash/dry hands;Modified independent Where Assessed - Grooming: Unsupported standing Upper Body Bathing: Modified independent Where Assessed - Upper Body Bathing: Unsupported sitting Lower Body Bathing: Modified independent Where Assessed - Lower Body Bathing: Unsupported sitting;Supported sit to stand Upper Body Dressing: Modified independent Where Assessed - Upper Body Dressing: Unsupported sitting Lower Body Dressing: Modified independent Where Assessed - Lower Body Dressing: Unsupported sitting;Supported sit to stand Toilet Transfer: Modified independent Toilet Transfer Method: Sit to Barista: Regular height toilet Toileting - Clothing Manipulation and Hygiene: Independent Where  Assessed - Toileting Clothing Manipulation and Hygiene: Standing Transfers/Ambulation Related to ADLs: modified independent within room/bathroom ADL Comments: Instructed pt in energy conservation.    OT Diagnosis:    OT Problem List:   OT Treatment Interventions:     OT Goals(Current goals can be found in the care plan section) Acute Rehab OT Goals Patient Stated Goal: Regain energy.  Visit Information  Last OT Received On: 04/22/13 Assistance Needed: +1 History of Present Illness: Adm with abd pain, N/V, dehydration, hypotension. Recent history includes CABG 01/2013. 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 EF down to 20-25% and discharged home with Lifevest.       Prior Functioning     Home Living Family/patient expects to be discharged to:: Private residence Living Arrangements: Spouse/significant other Available Help at Discharge: Family;Available 24 hours/day Type of Home: House Home Access: Level entry Home Layout: One level Home Equipment: Walker - 2 wheels;Cane - single point Prior Function Level of Independence: Independent Comments: states he does not use any DME,pt only allowed out of lifevest for 15 minutes, so sponge bathes Communication Communication: No difficulties Dominant Hand: Right         Vision/Perception Vision - History Patient Visual Report: No change from baseline   Cognition  Cognition Arousal/Alertness: Awake/alert Behavior During Therapy: WFL for tasks assessed/performed Overall Cognitive Status: Within Functional Limits for tasks assessed    Extremity/Trunk Assessment Upper Extremity Assessment Upper Extremity Assessment: Overall WFL for tasks assessed Lower Extremity Assessment Lower Extremity Assessment: Overall WFL for tasks assessed Cervical / Trunk Assessment Cervical / Trunk Assessment: Normal     Mobility Transfers Overall transfer level: Modified independent      Exercise     Balance Balance Overall balance assessment: Independent    End  of Session OT - End of Session Activity Tolerance: Patient tolerated treatment well Patient left: in chair;with call bell/phone within reach;with family/visitor present  GO     Evern BioMayberry, Naziah Weckerly Lynn 04/22/2013, 2:42 PM [phone]

## 2013-04-22 NOTE — Progress Notes (Signed)
RN called- pt had "chest pain" for 10 minutes. He is not sure if its secondary to his CABG or angina. Will order his home Vicodin and transfer to stepdown instead of telemetry.   Corine ShelterLUKE Gid Schoffstall PA-C 04/22/2013 6:11 PM

## 2013-04-22 NOTE — Progress Notes (Signed)
dcd contact isolation , c diff negative

## 2013-04-22 NOTE — Evaluation (Signed)
Physical Therapy Evaluation Patient Details Name: Earl Williamson MRN: [id number] DOB: Mar 31, 1940 Today's Date: 04/22/2013 Time: [id number] PT Time Calculation (min): 26 min  PT Assessment / Plan / Recommendation History of Present Illness  Adm with abd pain, N/V, dehydration, hypotension. Recent history includes CABG 01/2013. 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 EF down to 20-25% and discharged home with Lifevest.  Clinical Impression  Patient evaluated by Physical Therapy with no further acute PT needs identified. All education has been completed and the patient has no further questions. Discussed progression to Cardiac Rehab Phase II when MD okays, and [facility]. PT is signing off. Thank you for this referral.     PT Assessment  Patent does not need any further PT services    Follow Up Recommendations  No PT follow up;Other (comment) (Cardiac Rehab Phase II)    Does the patient have the potential to tolerate intense rehabilitation      Barriers to Discharge        Equipment Recommendations  None recommended by PT    Recommendations for Other Services     Frequency      Precautions / Restrictions Precautions Precautions: Other (comment) Precaution Comments: Lifevest   Pertinent Vitals/Pain HR 80-90 SaO2 94% on RA BP after walking 161/79      Mobility  Transfers Overall transfer level: Modified independent Ambulation/Gait Ambulation/Gait assistance: Supervision;Independent Ambulation Distance (Feet): 300 Feet Assistive device: None Gait Pattern/deviations: WFL(Within Functional Limits) Gait velocity interpretation: at or above normal speed for age/gender    Exercises     PT Diagnosis:    PT Problem List:   PT Treatment Interventions:       PT Goals(Current goals can be found in the care plan section) Acute Rehab PT Goals PT Goal Formulation: No goals set, d/c  therapy  Visit Information  Last PT Received On: 04/22/13 Assistance Needed: +1 History of Present Illness: Adm with abd pain, N/V, dehydration, hypotension. Recent history includes CABG 01/2013. 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 EF down to 20-25% and discharged home with Lifevest.       Prior Functioning  Home Living Family/patient expects to be discharged to:: Private residence Living Arrangements: Spouse/significant other Available Help at Discharge: Family;Available 24 hours/day Type of Home: House Home Access: Level entry Home Layout: One level Home Equipment: Walker - 2 wheels;Cane - single point Prior Function Level of Independence: Independent Comments: states he does not use any DME    Cognition  Cognition Arousal/Alertness: Awake/alert Behavior During Therapy: WFL for tasks assessed/performed Overall Cognitive Status: Within Functional Limits for tasks assessed    Extremity/Trunk Assessment Cervical / Trunk Assessment Cervical / Trunk Assessment: Normal   Balance Balance Overall balance assessment: Independent General Comments General comments (skin integrity, edema, etc.): Pt denies problems with mobility or balance since d/c home 04/17/13. Reports he has not been walking enough and educated him on benefits of walking (especially until he is allowed to begin Cardiac Rehab Phase II). [facility] in early mornings during bad weather/winter.   End of Session PT - End of Session Activity Tolerance: Patient tolerated treatment well Patient left: in chair;with call bell/phone within reach Nurse Communication: Mobility status;Other (comment) (no PT needs; needs to walk 3x/day)  GP     Berel Najjar 04/22/2013, 12:18 PM Pager [phone]

## 2013-04-23 DIAGNOSIS — E785 Hyperlipidemia, unspecified: Secondary | ICD-10-CM

## 2013-04-23 LAB — CBC
HCT: 31.1 % — ABNORMAL LOW (ref 39.0–52.0)
Hemoglobin: 10.2 g/dL — ABNORMAL LOW (ref 13.0–17.0)
MCH: 26.8 pg (ref 26.0–34.0)
MCHC: 32.8 g/dL (ref 30.0–36.0)
MCV: 81.8 fL (ref 78.0–100.0)
Platelets: 192 10*3/uL (ref 150–400)
RBC: 3.8 MIL/uL — AB (ref 4.22–5.81)
RDW: 14.4 % (ref 11.5–15.5)
WBC: 3.1 10*3/uL — ABNORMAL LOW (ref 4.0–10.5)

## 2013-04-23 LAB — COMPREHENSIVE METABOLIC PANEL
ALT: 12 U/L (ref 0–53)
AST: 13 U/L (ref 0–37)
Albumin: 2.9 g/dL — ABNORMAL LOW (ref 3.5–5.2)
Alkaline Phosphatase: 35 U/L — ABNORMAL LOW (ref 39–117)
BILIRUBIN TOTAL: 0.8 mg/dL (ref 0.3–1.2)
BUN: 14 mg/dL (ref 6–23)
CALCIUM: 8 mg/dL — AB (ref 8.4–10.5)
CHLORIDE: 108 meq/L (ref 96–112)
CO2: 22 meq/L (ref 19–32)
CREATININE: 0.92 mg/dL (ref 0.50–1.35)
GFR, EST NON AFRICAN AMERICAN: 83 mL/min — AB (ref >90)
GLUCOSE: 84 mg/dL (ref 70–99)
Potassium: 3.6 mEq/L — ABNORMAL LOW (ref 3.7–5.3)
Sodium: 143 mEq/L (ref 137–147)
Total Protein: 5.7 g/dL — ABNORMAL LOW (ref 6.0–8.3)

## 2013-04-23 LAB — GLUCOSE, CAPILLARY
Glucose-Capillary: 94 mg/dL (ref 70–99)
Glucose-Capillary: 158 mg/dL — ABNORMAL HIGH (ref 70–99)
Glucose-Capillary: 71 mg/dL (ref 70–99)
Glucose-Capillary: 112 mg/dL — ABNORMAL HIGH (ref 70–99)

## 2013-04-23 LAB — LACTATE DEHYDROGENASE: LDH: 143 U/L (ref 94–250)

## 2013-04-23 LAB — LIPASE, BLOOD: LIPASE: 29 U/L (ref 11–59)

## 2013-04-23 MED ORDER — POTASSIUM CHLORIDE CRYS ER 20 MEQ PO TBCR: 40.0000 meq | EXTENDED_RELEASE_TABLET | ORAL | Status: DC | PRN

## 2013-04-23 MED ORDER — MAGNESIUM SULFATE 40 MG/ML IJ SOLN
INTRAMUSCULAR | Status: AC
  Filled 2013-04-23: qty 50

## 2013-04-23 MED ORDER — RAMIPRIL 5 MG PO CAPS
5.0000 mg | ORAL_CAPSULE | Freq: Every day | ORAL | Status: DC
  Administered 2013-04-23 – 2013-04-24 (×2): 5 mg via ORAL
  Filled 2013-04-23 (×2): qty 1

## 2013-04-23 NOTE — Progress Notes (Signed)
    Subjective:  Feeling better. Had brief chest pain last night when he got up from the chair, but has had pain ever since surgery. No dyspnea.  Objective:  Vital Signs in the last 24 hours: Temp:  [97.6 F (36.4 C)-99.6 F (37.6 C)] 98 F (36.7 C) (02/12 1229) Pulse Rate:  [53-82] 68 (02/12 1220) Resp:  [9-24] 21 (02/12 1220) BP: (94-165)/(47-72) 114/52 mmHg (02/12 1220) SpO2:  [98 %-100 %] 100 % (02/12 1220) Weight:  [225 lb 12 oz (102.4 kg)] 225 lb 12 oz (102.4 kg) (02/12 0500)  Intake/Output from previous day: 02/11 0701 - 02/12 0700 In: 2015 [P.O.:865; I.V.:1150] Out: 1375 [Urine:1375]  Physical Exam: Pt is alert and oriented, overweight male in NAD HEENT: normal Neck: JVP - normal, carotids 2+= without bruits Lungs: CTA bilaterally CV: RRR without murmur or gallop Abd: soft, NT, Positive BS, no hepatomegaly Ext: no C/C/E, distal pulses intact and equal Skin: warm/dry no rash   Lab Results:  Recent Labs  04/22/13 0215 04/23/13 0230  WBC 4.0 3.1*  HGB 10.4* 10.2*  PLT 191 192    Recent Labs  04/22/13 0215 04/23/13 0230  NA 141 143  K 3.8 3.6*  CL 105 108  CO2 23 22  GLUCOSE 91 84  BUN 21 14  CREATININE 1.14 0.92    Recent Labs  04/21/13 1105 04/21/13 1644  TROPONINI <0.30 <0.30    Tele: Sinus rhythm, personally reviewed  Assessment/Plan:  1. CAD, native vessel. Stable after recent high-risk PCI 2. AKI, resolved 3. Chronic systolic heart failure 4. Enteritis  He is greatly improved and I think approaching readiness for hospital discharge. Recommend add back ACI-I at lower dose. Continue low-dose carvedilol. Will stay off of aldactone for now. If remains hemodynamically stable could be discharged tomorrow from my perspective.   Tonny BollmanMichael Chyrel Taha, M.D. 04/23/2013, 1:27 PM

## 2013-04-23 NOTE — Progress Notes (Signed)
Magnesium not given to this patient. [place]. Medication has been returned to pyxis. Spoke with Dr. Butler Denmarkizwan, no intentions to give magnesium at this time.   Wyn QuakerSHYSHKO, Jillayne Witte RN

## 2013-04-23 NOTE — Progress Notes (Addendum)
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 Recently admitted by Cardiology on 2/3 for PCI and stent to LIMA-LAD as well as Impella w/ PCI and stent to RCA. Discharged to home on 2/6 on brilinta, aspirin, ace I, bb, and life vest. Presented on 2/9 w/ 1 day h/o cramping abd pain primarily over LLQ, N/V and orthostatic hypotension. CT abd showed: Thickening of small bowel loops in the anatomic pelvis compatible with enteritis. PCCM was asked to admit given the findings of hypotension, and new AKI.   SIGNIFICANT EVENTS / STUDIES:  CT abd pelvis 2/10: Thickening of small bowel loops in the anatomic pelvis compatible with enteritis. Differential considerations are inflammatory bowel disease, infection, and ischemia. No pneumatosis.  CT chest 2/10: Dependent atelectasis is present in the lungs. Lingular scarring. Postoperative changes of CABG are present. Residual stranding is present in the anterior mediastinum. Sternal wires remain intact and the median sternotomy defect appears within normal limits for this timeframe after operation.   HPI/Subjective: The patient had chest pain last night but now tells me that is was when "pulling up in bed" and has resolved. No chest wall tenderness. No complaints.   Assessment/Plan:  abd pain/ N/V unclear if this is infectious or possibly ischemic - no current diarrhea - C. Diff PCR negative - empiric flagyl started per PCCM- have stopped this  h/o OSA  Not currently on CPAP at home due to damaged device - has f/u w/ Dr Maple Hudson for this as new referral  Severe CAD s/p recent PCI to LIMA-LAD anastomosis, as well as proximal and distal RCA (Impella supported) on 2/3. ICM wEF 20% As per Cardiology- cardiology has resume ACE I at lower dose- I  will d/c NS IVF  AKI Hold antihypertensives / diuretics   DM  Code Status: FULL Family Communication: no family present at time of exam Disposition Plan: transfer to tele bed - likely home in AM  Consultants: Cardiology  Antibiotics: Flagyl 2/10 >2/12  DVT prophylaxis: SCDs + SQ heparin   Objective: Blood pressure 108/95, pulse 64, temperature 98.1 F (36.7 C), temperature source Oral, resp. rate 15, height 5\' 6"  (1.676 m), weight 102.4 kg (225 lb 12 oz), SpO2 100.00%.  Intake/Output Summary (Last 24 hours) at 04/23/13 1627 Last data filed at 04/23/13 1200  Gross per 24 hour  Intake   1325 ml  Output    625 ml  Net    700 ml   Exam: General: No acute respiratory distress Lungs: Clear to auscultation bilaterally without wheezes or crackles Cardiovascular: Regular rate and rhythm without murmur gallop or rub normal S1 and S2 Abdomen: Nontender, nondistended, soft, bowel sounds positive, no rebound, no ascites, no appreciable mass Extremities: No significant cyanosis, clubbing, or edema bilateral lower extremities  Data Reviewed: Basic Metabolic Panel:  Recent Labs Lab 04/17/13 1010 04/21/13 0152 04/21/13 0521 04/21/13 0731 04/22/13 0215 04/23/13 0230  NA 138 139 143 144 141 143  K 3.3* 4.3 4.5 4.4 3.8 3.6*  CL 95* 98 104 105 105 108  CO2 24  --  23 23 23 22   GLUCOSE 241* 262* 203* 174* 91 84  BUN 19 35* 35* 34* 21 14  CREATININE 1.09 2.30* 1.98* 1.86* 1.14 0.92  CALCIUM 8.7  --  8.1* 8.1* 8.1* 8.0*  MG  --   --  1.7  --   --   --   PHOS  --   --  4.7*  --   --   --    Liver Function Tests:  Recent Labs Lab 04/23/13 0230  AST 13  ALT 12  ALKPHOS 35*  BILITOT 0.8  PROT 5.7*  ALBUMIN 2.9*    Recent Labs Lab 04/21/13 0521 04/23/13 0230  LIPASE 63* 29   CBC:  Recent Labs Lab 04/17/13 0453 04/21/13 0147 04/21/13 0152 04/21/13 0521 04/22/13 0215 04/23/13 0230  WBC 4.0 7.4  --  6.0 4.0 3.1*  NEUTROABS  --  5.1  --   --   --   --   HGB  12.3* 14.9 17.0 12.8* 10.4* 10.2*  HCT 36.9* 43.2 50.0 37.9* 31.7* 31.1*  MCV 81.3 81.1  --  81.3 81.9 81.8  PLT 164 256  --  218 191 192   Cardiac Enzymes:  Recent Labs Lab 04/21/13 0521 04/21/13 1105 04/21/13 1644  TROPONINI <0.30 <0.30 <0.30   BNP (last 3 results)  Recent Labs  04/10/13 2217 04/17/13 1010  PROBNP 2362.0* 782.6*   CBG:  Recent Labs Lab 04/22/13 1212 04/22/13 1719 04/22/13 2216 04/23/13 0822 04/23/13 1135  GLUCAP 99 132* 94 94 158*    Recent Results (from the past 240 hour(s))  CLOSTRIDIUM DIFFICILE BY PCR     Status: None   Collection Time    04/22/13  5:25 PM      Result Value Ref Range Status   C difficile by pcr NEGATIVE  NEGATIVE Final     Studies:  Recent x-ray studies have been reviewed in detail by the Attending Physician  Scheduled Meds:  Scheduled Meds: . aspirin EC  81 mg Oral Daily  . carvedilol  3.125 mg Oral BID WC  . heparin subcutaneous  5,000 Units Subcutaneous 3 times per day  . insulin aspart  0-15 Units Subcutaneous TID WC  . insulin glargine  10 Units Subcutaneous QHS  . ramipril  5 mg Oral Daily  . ranitidine  150 mg Oral BID  . simvastatin  40 mg Oral Daily  . Ticagrelor  90 mg Oral BID    Time spent on care of this patient: 35 mins   Calvert CantorIZWAN,Sanna Porcaro, MD  [facility] Office  [phone] Pager - Text Page per Loretha StaplerAmion as per below:  On-Call/Text Page:      [email or web address]      password [email or web address]  If 7PM-7AM, please contact night-coverage [email or web address] Password [email or web address] 04/23/2013, 4:27 PM   LOS: 2 days

## 2013-04-23 NOTE — Progress Notes (Signed)
Patient refuses CPAP at this time.  

## 2013-04-24 DIAGNOSIS — Z951 Presence of aortocoronary bypass graft: Secondary | ICD-10-CM

## 2013-04-24 DIAGNOSIS — Z9861 Coronary angioplasty status: Secondary | ICD-10-CM

## 2013-04-24 DIAGNOSIS — A09 Infectious gastroenteritis and colitis, unspecified: Secondary | ICD-10-CM | POA: Diagnosis present

## 2013-04-24 DIAGNOSIS — I2510 Atherosclerotic heart disease of native coronary artery without angina pectoris: Secondary | ICD-10-CM

## 2013-04-24 DIAGNOSIS — I214 Non-ST elevation (NSTEMI) myocardial infarction: Secondary | ICD-10-CM

## 2013-04-24 DIAGNOSIS — I10 Essential (primary) hypertension: Secondary | ICD-10-CM

## 2013-04-24 DIAGNOSIS — N179 Acute kidney failure, unspecified: Secondary | ICD-10-CM | POA: Diagnosis present

## 2013-04-24 LAB — CBC
HCT: 33.2 % — ABNORMAL LOW (ref 39.0–52.0)
Hemoglobin: 11.2 g/dL — ABNORMAL LOW (ref 13.0–17.0)
MCH: 27.6 pg (ref 26.0–34.0)
MCHC: 33.7 g/dL (ref 30.0–36.0)
MCV: 81.8 fL (ref 78.0–100.0)
Platelets: 205 10*3/uL (ref 150–400)
RBC: 4.06 MIL/uL — AB (ref 4.22–5.81)
RDW: 14.4 % (ref 11.5–15.5)
WBC: 2.8 10*3/uL — ABNORMAL LOW (ref 4.0–10.5)

## 2013-04-24 LAB — BASIC METABOLIC PANEL
BUN: 9 mg/dL (ref 6–23)
CALCIUM: 8.5 mg/dL (ref 8.4–10.5)
CO2: 22 meq/L (ref 19–32)
Chloride: 108 mEq/L (ref 96–112)
Creatinine, Ser: 0.9 mg/dL (ref 0.50–1.35)
GFR calc Af Amer: >90 mL/min (ref >90)
GFR calc non Af Amer: 84 mL/min — ABNORMAL LOW (ref >90)
GLUCOSE: 99 mg/dL (ref 70–99)
Potassium: 3.8 mEq/L (ref 3.7–5.3)
Sodium: 141 mEq/L (ref 137–147)

## 2013-04-24 LAB — GLUCOSE, CAPILLARY
Glucose-Capillary: 96 mg/dL (ref 70–99)
Glucose-Capillary: 123 mg/dL — ABNORMAL HIGH (ref 70–99)

## 2013-04-24 MED ORDER — CARVEDILOL 12.5 MG PO TABS: 6.2500 mg | ORAL_TABLET | Freq: Two times a day (BID) | ORAL | Status: DC

## 2013-04-24 NOTE — Progress Notes (Signed)
Subjective: No CP  NO SOB Objective: Filed Vitals:   04/24/13 0341 04/24/13 0500 04/24/13 0821 04/24/13 0921  BP: 115/70  107/65 122/70  Pulse: 55  60   Temp: 98.2 F (36.8 C)  97.6 F (36.4 C)   TempSrc: Oral  Oral   Resp: 12  18   Height:      Weight:  229 lb 15 oz (104.3 kg)    SpO2: 99%  98%    Weight change: 4 lb 3 oz (1.9 kg)  Intake/Output Summary (Last 24 hours) at 04/24/13 0946 Last data filed at 04/24/13 0342  Gross per 24 hour  Intake    150 ml  Output    625 ml  Net   -475 ml    General: Alert, awake, oriented x3, in no acute distress Neck:  JVP is normal Heart: Regular rate and rhythm, without murmurs, rubs, gallops.  Lungs: Clear to auscultation.  No rales or wheezes. Exemities:  No edema.   Neuro: Grossly intact, nonfocal.  Tele  SR  Lab Results: Results for orders placed during the hospital encounter of 04/21/13 (from the past 24 hour(s))  GLUCOSE, CAPILLARY     Status: Abnormal   Collection Time    04/23/13 11:35 AM      Result Value Ref Range   Glucose-Capillary 158 (*) 70 - 99 mg/dL  GLUCOSE, CAPILLARY     Status: None   Collection Time    04/23/13  6:02 PM      Result Value Ref Range   Glucose-Capillary 71  70 - 99 mg/dL  GLUCOSE, CAPILLARY     Status: Abnormal   Collection Time    04/23/13  9:57 PM      Result Value Ref Range   Glucose-Capillary 112 (*) 70 - 99 mg/dL  CBC     Status: Abnormal   Collection Time    04/24/13  5:23 AM      Result Value Ref Range   WBC 2.8 (*) 4.0 - 10.5 K/uL   RBC 4.06 (*) 4.22 - 5.81 MIL/uL   Hemoglobin 11.2 (*) 13.0 - 17.0 g/dL   HCT 16.1 (*) 09.6 - 04.5 %   MCV 81.8  78.0 - 100.0 fL   MCH 27.6  26.0 - 34.0 pg   MCHC 33.7  30.0 - 36.0 g/dL   RDW 40.9  81.1 - 91.4 %   Platelets 205  150 - 400 K/uL  BASIC METABOLIC PANEL     Status: Abnormal   Collection Time    04/24/13  5:23 AM      Result Value Ref Range   Sodium 141  137 - 147 mEq/L   Potassium 3.8  3.7 - 5.3 mEq/L   Chloride 108  96 - 112  mEq/L   CO2 22  19 - 32 mEq/L   Glucose, Bld 99  70 - 99 mg/dL   BUN 9  6 - 23 mg/dL   Creatinine, Ser 7.82  0.50 - 1.35 mg/dL   Calcium 8.5  8.4 - 95.6 mg/dL   GFR calc non Af Amer 84 (*) >90 mL/min   GFR calc Af Amer >90  >90 mL/min  GLUCOSE, CAPILLARY     Status: None   Collection Time    04/24/13  8:01 AM      Result Value Ref Range   Glucose-Capillary 96  70 - 99 mg/dL    Studies/Results: @RISRSLT24 @  Medications: Reviewed   @PROBHOSP @  1.  CAD  Patient s/p  PCI of LIMA to LAD insertion, mid RCA and PDA.  Plan for ASA and Brilinta for at least 12 monts Contnue medical Rx.    2.  Renal  Renal function improved  3.  Chronic systolic CHF.  Volume status improved.  Keep on medical Rx.  4.  Enteritis.  Resolved  LOS: 3 days   Dietrich Patesaula Jennea Rager 04/24/2013, 9:46 AM  Patient has appt with Ranae Palms Harding on 3/18  Will Keep  I have made appt with PA at that office on 04/30/13 at 2 PM ([facility]) with PA Arlys John(Brian)  Dietrich PatesPaula Ovetta Bazzano

## 2013-04-24 NOTE — Discharge Summary (Signed)
Physician Discharge Summary  Earl Williamson ZOX:[id number] DOB: [birth year] DOA: 04/21/2013  PCP: Corky Downs, MD  Admit date: 04/21/2013 Discharge date: 04/24/2013  Time spent: >45 minutes  Recommendations for Outpatient Follow-up:  F/u BP and resume home dose of Coreg if feasable   Discharge Diagnoses:  Principal Problem:   Gastroenteritis, infectious Active Problems:   CAD S/P percutaneous coronary angioplasty - PCI  x 3 stents @ [facility]; CABG x 3 01/30/13 ; PCI to LIMA-LAD anastomosis, as well as proximal and distal RCA (Impella supported.   DM type 2 (diabetes mellitus, type 2)   HTN (hypertension), benign   HLD (hyperlipidemia)   Cardiomyopathy, ischemic, EF 30-35%, 01/27/13 - now roughly 20% by LV gram February 2015   Obesity, Class II, BMI 35-39.9, with comorbidity   Obstructive sleep apnea   Discharge Condition: stable  Diet recommendation: heart healthy  Filed Weights   04/22/13 0500 04/23/13 0500 04/24/13 0500  Weight: 102.9 kg (226 lb 13.7 oz) 102.4 kg (225 lb 12 oz) 104.3 kg (229 lb 15 oz)    History of present illness:  73 year old male w/ complex cardiac history w/ severe residual CAD w/ failed grafts s/p CABG 01/2013. Recently admitted by Cardiology on 2/3 for PCI and stent to LIMA-LAD as well as Impella w/ PCI and stent to RCA. Discharged to home on 2/6 on brilinta, aspirin, ace I, bb, and life vest. Presented on 2/9 w/ 1 day h/o cramping abd pain primarily over LLQ, N/V and orthostatic hypotension. CT abd showed: Thickening of small bowel loops in the anatomic pelvis compatible with enteritis. PCCM was asked to admit given the findings of hypotension, and new AKI.    Hospital Course:  abd pain/ N/V  - Likely infectious -  - C. Diff PCR negative - empiric flagyl started per PCCM- have stopped this  - noted to have mild leukopenia which goes along with a viral illness - symptoms have resolved and he is tolerating regular food for over 24 hrs  h/o OSA  Not  currently on CPAP at home due to damaged device - has f/u w/ Dr Maple Hudson for this as new referral   Severe CAD s/p recent PCI to LIMA-LAD anastomosis, as well as proximal and distal RCA (Impella supported) on 2/3.  ICM wEF 20%  - As per Cardiology- cardiology has resumed ACE I half dose on 2/12 - I will increase back to home dose as BP 139/70 currently after receiving 1/2 dose this AM - Spironolactone has been on hold-  will resume today - he has an appt to see cardio PA on 2/19   AKI / dehydration / Hypotension - initial BP readings in 80-90 systolic range with symptoms of orthostasis- this has resolved - Held antihypertensives / diuretics  - now resolved and appears to be euvolemic - for now, use 1/2 dose Coreg until he f/u with cardiology- he was taking 1/4 dose here  DM stable  Procedures:  none  Consultations: - Cardiology  Discharge Exam: Filed Vitals:   04/24/13 1239  BP: 139/70  Pulse: 62  Temp: 98.1 F (36.7 C)  Resp: 22    General: AAO x 3, sitting on side of bed, cleaning himself  Cardiovascular: RRR, no murmurs Respiratory: CTA b/l  Ext: no c/c/e  Discharge Instructions       Future Appointments Provider Department Dept Phone   04/27/2013 10:00 AM Waymon Budge, MD [facility] [phone]   04/30/2013 2:00 PM Wilburt Finlay, PA-C [facility]  [phone]   05/12/2013 3:00 PM [facility] Rm [address] [facility] [address] [phone]   05/15/2013 3:00 PM Kerin Perna, MD [facility] [phone]   05/27/2013 3:30 PM Marykay Lex, MD [facility] [phone]       Medication List    ASK your doctor about these medications       aspirin 81 MG EC tablet  Take 1 tablet (81 mg total) by mouth daily.     carvedilol 12.5 MG tablet  Commonly known as:  COREG  Take 1 tablet (12.5 mg total) by mouth 2 (two) times daily with a meal.     docusate sodium 100 MG  capsule  Commonly known as:  COLACE  Take 100 mg by mouth daily as needed for mild constipation.     furosemide 80 MG tablet  Commonly known as:  LASIX  Take 1 tablet (80 mg total) by mouth 2 (two) times daily.     glimepiride 2 MG tablet  Commonly known as:  AMARYL  Take 1 tablet (2 mg total) by mouth daily with breakfast.     metFORMIN 500 MG tablet  Commonly known as:  GLUCOPHAGE  Take 500 mg by mouth 2 (two) times daily with a meal.     metoprolol 50 MG tablet  Commonly known as:  LOPRESSOR  Take 1 tablet (50 mg total) by mouth 2 (two) times daily.     nitroGLYCERIN 0.4 MG SL tablet  Commonly known as:  NITROSTAT  Place 1 tablet (0.4 mg total) under the tongue every 5 (five) minutes as needed for chest pain.     pantoprazole 40 MG tablet  Commonly known as:  PROTONIX  Take 40 mg by mouth 2 (two) times daily.     potassium chloride 10 MEQ tablet  Commonly known as:  K-DUR  Take 1 tablet (10 mEq total) by mouth 2 (two) times daily.     ramipril 10 MG capsule  Commonly known as:  ALTACE  Take 1 capsule (10 mg total) by mouth daily.     simvastatin 40 MG tablet  Commonly known as:  ZOCOR  Take 40 mg by mouth daily.     spironolactone 12.5 mg Tabs tablet  Commonly known as:  ALDACTONE  Take 0.5 tablets (12.5 mg total) by mouth 2 (two) times daily.     Ticagrelor 90 MG Tabs tablet  Commonly known as:  BRILINTA  Take 1 tablet (90 mg total) by mouth 2 (two) times daily.       No Known Allergies    The results of significant diagnostics from this hospitalization (including imaging, microbiology, ancillary and laboratory) are listed below for reference.    Significant Diagnostic Studies: Ct Abdomen Pelvis Wo Contrast  04/21/2013   CLINICAL DATA:  Abdominal pain.  Recent median sternotomy for CABG.  EXAM: CT CHEST, ABDOMEN AND PELVIS WITHOUT CONTRAST  TECHNIQUE: Multidetector CT imaging of the chest, abdomen and pelvis was performed following the standard protocol  without IV contrast.  COMPARISON:  None.  FINDINGS: CT CHEST FINDINGS  Dependent atelectasis is present in the lungs. Lingular scarring. Postoperative changes of CABG are present. Residual stranding is present in the anterior mediastinum. Sternal wires remain intact and the median sternotomy defect appears within normal limits for this timeframe after operation. Mild cardiomegaly is present. There is no axillary adenopathy. No mediastinal or hilar adenopathy. No aggressive osseous lesions in the chest.  CT ABDOMEN AND PELVIS  FINDINGS  Liver: Tiny low-density lesion in the inferior right hepatic lobe probably represents a cyst. Larger lesion in the right hepatic dome measures 18 mm (image 47 series 2), also probably a cyst.  Small amount of ascites is present around the liver and spleen.  Spleen:  Normal.  Gallbladder:  Biliary sludge with high density material in the neck.  Common bile duct:  Normal.  Pancreas:  Normal.  Adrenal glands: Thickening bilaterally, and most compatible with hyperplasia.  Kidneys: Vascular calcifications. No obstruction/hydronephrosis. Both ureters appear normal.  Stomach:  Collapsed.  Grossly normal.  Small bowel: Enteritis is present in the anatomic pelvis. There is no bowel obstruction. Stranding surrounds loops of small bowel and a small amount of ascites is present around pelvic small bowel as well. Stranding extends into the mesentery. There is no pneumatosis.  Colon: Fluid is present within the colon, abnormal but nonspecific. No colonic mural thickening is identified.  Pelvic Genitourinary: Collapsed urinary bladder. Prostate calcifications. Fluid in the anatomic pelvis is low to intermediate attenuation and probably represents minimally complex ascites. No pelvic abscess.  Bones: No aggressive osseous lesions. Severe left hip osteoarthritis, which appears secondary to either AVN or developmental hip dysplasia. Heterotopic bone adjacent to the left hip. 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  Vasculature: Atherosclerosis without an acute vascular abnormality.  Body Wall: Stranding is present in both inguinal regions compatible with recent vascular access. No fluid collection/abscess. No retroperitoneal hemorrhage.  IMPRESSION: 1. Thickening of small bowel loops in the anatomic pelvis compatible with enteritis. Differential considerations are inflammatory bowel disease, infection, and ischemia. No pneumatosis. 2. Small amount of ascites, with intermediate attenuation in the pelvis suggesting debris or complex ascites. 3. Postoperative changes compatible with recent CABG and bilateral femoral artery access. 4. Fluid levels within the colon are probably related this small bowel process. 5. These results were called by telephone at the time of interpretation on 04/21/2013 at 3:18 AM to Dr. Deanna Artis , who verbally acknowledged these results.   Electronically Signed   By: Andreas Newport M.D.   On: 04/21/2013 03:19   Ct Head Wo Contrast  04/15/2013   CLINICAL DATA:  Code stroke.  Slurred speech.  Weakness.  EXAM: CT HEAD WITHOUT CONTRAST  TECHNIQUE: Contiguous axial images were obtained from the base of the skull through the vertex without intravenous contrast.  COMPARISON:  None.  FINDINGS: There is no evidence for acute hemorrhage, hydrocephalus, mass lesion, or abnormal extra-axial fluid collection. No definite CT evidence for acute infarction. Diffuse loss of parenchymal volume is consistent with atrophy.  The visualized paranasal sinuses and mastoid air cells are clear.  IMPRESSION: No CT evidence for acute intracranial abnormality.  I personally called these findings to Dr. Roseanne Reno at 1405 hr on 04/15/2013.   Electronically Signed   By: Kennith Center M.D.   On: 04/15/2013 14:05   Ct Chest Wo Contrast  04/21/2013   CLINICAL DATA:  Abdominal pain.  Recent median sternotomy for CABG.  EXAM: CT CHEST, ABDOMEN AND PELVIS WITHOUT CONTRAST  TECHNIQUE:  Multidetector CT imaging of the chest, abdomen and pelvis was performed following the standard protocol without IV contrast.  COMPARISON:  None.  FINDINGS: CT CHEST FINDINGS  Dependent atelectasis is present in the lungs. Lingular scarring. Postoperative changes of CABG are present. Residual stranding is present in the anterior mediastinum. Sternal wires remain intact and the median sternotomy defect appears within normal limits for this timeframe after operation. Mild cardiomegaly is  present. There is no axillary adenopathy. No mediastinal or hilar adenopathy. No aggressive osseous lesions in the chest.  CT ABDOMEN AND PELVIS FINDINGS  Liver: Tiny low-density lesion in the inferior right hepatic lobe probably represents a cyst. Larger lesion in the right hepatic dome measures 18 mm (image 47 series 2), also probably a cyst.  Small amount of ascites is present around the liver and spleen.  Spleen:  Normal.  Gallbladder:  Biliary sludge with high density material in the neck.  Common bile duct:  Normal.  Pancreas:  Normal.  Adrenal glands: Thickening bilaterally, and most compatible with hyperplasia.  Kidneys: Vascular calcifications. No obstruction/hydronephrosis. Both ureters appear normal.  Stomach:  Collapsed.  Grossly normal.  Small bowel: Enteritis is present in the anatomic pelvis. There is no bowel obstruction. Stranding surrounds loops of small bowel and a small amount of ascites is present around pelvic small bowel as well. Stranding extends into the mesentery. There is no pneumatosis.  Colon: Fluid is present within the colon, abnormal but nonspecific. No colonic mural thickening is identified.  Pelvic Genitourinary: Collapsed urinary bladder. Prostate calcifications. Fluid in the anatomic pelvis is low to intermediate attenuation and probably represents minimally complex ascites. No pelvic abscess.  Bones: No aggressive osseous lesions. Severe left hip osteoarthritis, which appears secondary to either AVN  or developmental hip dysplasia. Heterotopic bone adjacent to the left hip. 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  Vasculature: Atherosclerosis without an acute vascular abnormality.  Body Wall: Stranding is present in both inguinal regions compatible with recent vascular access. No fluid collection/abscess. No retroperitoneal hemorrhage.  IMPRESSION: 1. Thickening of small bowel loops in the anatomic pelvis compatible with enteritis. Differential considerations are inflammatory bowel disease, infection, and ischemia. No pneumatosis. 2. Small amount of ascites, with intermediate attenuation in the pelvis suggesting debris or complex ascites. 3. Postoperative changes compatible with recent CABG and bilateral femoral artery access. 4. Fluid levels within the colon are probably related this small bowel process. 5. These results were called by telephone at the time of interpretation on 04/21/2013 at 3:18 AM to Dr. Deanna ArtisAPRIL PALUMBO-RASCH , who verbally acknowledged these results.   Electronically Signed   By: Andreas NewportGeoffrey  Lamke M.D.   On: 04/21/2013 03:19   Mr Maxine GlennMra Head Wo Contrast  04/15/2013   CLINICAL DATA:  Stroke. Recent cardiac catheterization and coronary stenting.  EXAM: MRI HEAD WITHOUT CONTRAST  MRA HEAD WITHOUT CONTRAST  TECHNIQUE: Multiplanar, multiecho pulse sequences of the brain and surrounding structures were obtained without intravenous contrast. Angiographic images of the head were obtained using MRA technique without contrast.  COMPARISON:  CT head 04/15/2013  FINDINGS: MRI HEAD FINDINGS  Small areas of acute infarct in the left frontal operculum measuring under 1 cm. Small acute infarct in the high right frontal cortex. Small area of acute infarction in the right occipital parietal lobe. These are most likely due to small emboli.  Negative for intracranial hemorrhage.  Negative for mass lesion.  Mild generalized atrophy. Negative for hydrocephalus. Brainstem and cerebellum intact.  No cortical infarct.  MRA HEAD FINDINGS  Both vertebral arteries are patent to the basilar. PICA is patent bilaterally. The basilar is tortuous but patent with mild atherosclerotic disease diffusely. Mild stenosis of the origin of the superior cerebellar artery bilaterally. Patent posterior communicating arteries bilaterally. Posterior cerebral arteries are patent bilaterally.  Atherosclerotic irregularity and mild stenosis in the cavernous carotid bilaterally.  Moderate stenosis of the A1 segment bilaterally. A2 segments are patent bilaterally.  Right middle cerebral artery shows moderate stenosis in the temporal branch. Parietal branch is widely patent.  Occlusion of the left M1 segment. No significant flow related signal in the left middle cerebral artery branches due to slow flow. This suggests there is considerable at risk brain in the left MCA territory. This occlusion could be acute or chronic.  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  Diffuse intracranial atherosclerotic disease. 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 This could be a chronic occlusion however cannot be dated with this study.   Electronically Signed   By: Marlan Palau M.D.   On: 04/15/2013 19:40   Mr Brain Wo Contrast  04/15/2013   CLINICAL DATA:  Stroke. Recent cardiac catheterization and coronary stenting.  EXAM: MRI HEAD WITHOUT CONTRAST  MRA HEAD WITHOUT CONTRAST  TECHNIQUE: Multiplanar, multiecho pulse sequences of the brain and surrounding structures were obtained without intravenous contrast. Angiographic images of the head were obtained using MRA technique without contrast.  COMPARISON:  CT head 04/15/2013  FINDINGS: MRI HEAD FINDINGS  Small areas of acute infarct in the left frontal operculum measuring under 1 cm. Small acute infarct in the high right frontal cortex. Small area of acute infarction in the right occipital parietal lobe. These are most  likely due to small emboli.  Negative for intracranial hemorrhage.  Negative for mass lesion.  Mild generalized atrophy. Negative for hydrocephalus. Brainstem and cerebellum intact. No cortical infarct.  MRA HEAD FINDINGS  Both vertebral arteries are patent to the basilar. PICA is patent bilaterally. The basilar is tortuous but patent with mild atherosclerotic disease diffusely. Mild stenosis of the origin of the superior cerebellar artery bilaterally. Patent posterior communicating arteries bilaterally. Posterior cerebral arteries are patent bilaterally.  Atherosclerotic irregularity and mild stenosis in the cavernous carotid bilaterally.  Moderate stenosis of the A1 segment bilaterally. A2 segments are patent bilaterally.  Right middle cerebral artery shows moderate stenosis in the temporal branch. Parietal branch is widely patent.  Occlusion of the left M1 segment. No significant flow related signal in the left middle cerebral artery branches due to slow flow. This suggests there is considerable at risk brain in the left MCA territory. This occlusion could be acute or chronic.  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  Diffuse intracranial atherosclerotic disease. 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 This could be a chronic occlusion however cannot be dated with this study.   Electronically Signed   By: Marlan Palau M.D.   On: 04/15/2013 19:40   Dg Chest Portable 1 View  04/10/2013   CLINICAL DATA:  Sternotomy for CABG 01/30/2013. Diaphoresis. Lightheaded. Chest pain. Left upper extremity tingling.  EXAM: PORTABLE CHEST - 1 VIEW  COMPARISON:  DG CHEST 2 VIEW dated 03/18/2013; DG CHEST 2 VIEW dated 02/25/2013; DG CHEST 2 VIEW dated 02/04/2013; DG CHEST 2 VIEW dated 02/03/2013; DG CHEST 1V PORT dated 01/30/2013; DG CHEST 2 VIEW dated 01/25/2013  FINDINGS: Sternotomy for CABG. Cardiac silhouette moderately enlarged but stable.  Interval development of mild diffuse interstitial pulmonary edema as evidenced by Charyl Dancer being Kerley C lines. No confluent airspace consolidation. No visible pleural effusions.  IMPRESSION: Mild CHF suspected, with stable moderate cardiomegaly and mild diffuse interstitial pulmonary edema.   Electronically Signed   By: Hulan Saas M.D.   On: 04/10/2013 21:30    Microbiology: Recent Results (from the past 240 hour(s))  CLOSTRIDIUM DIFFICILE BY  PCR     Status: None   Collection Time    04/22/13  5:25 PM      Result Value Ref Range Status   C difficile by pcr NEGATIVE  NEGATIVE Final     Labs: Basic Metabolic Panel:  Recent Labs Lab 04/21/13 0152 04/21/13 0521 04/21/13 0731 04/22/13 0215 04/23/13 0230 04/24/13 0523  NA 139 143 144 141 143 141  K 4.3 4.5 4.4 3.8 3.6* 3.8  CL 98 104 105 105 108 108  CO2  --  23 23 23 22 22   GLUCOSE 262* 203* 174* 91 84 99  BUN 35* 35* 34* 21 14 9   CREATININE 2.30* 1.98* 1.86* 1.14 0.92 0.90  CALCIUM  --  8.1* 8.1* 8.1* 8.0* 8.5  MG  --  1.7  --   --   --   --   PHOS  --  4.7*  --   --   --   --    Liver Function Tests:  Recent Labs Lab 04/23/13 0230  AST 13  ALT 12  ALKPHOS 35*  BILITOT 0.8  PROT 5.7*  ALBUMIN 2.9*    Recent Labs Lab 04/21/13 0521 04/23/13 0230  LIPASE 63* 29   No results found for this basename: AMMONIA,  in the last 168 hours CBC:  Recent Labs Lab 04/21/13 0147 04/21/13 0152 04/21/13 0521 04/22/13 0215 04/23/13 0230 04/24/13 0523  WBC 7.4  --  6.0 4.0 3.1* 2.8*  NEUTROABS 5.1  --   --   --   --   --   HGB 14.9 17.0 12.8* 10.4* 10.2* 11.2*  HCT 43.2 50.0 37.9* 31.7* 31.1* 33.2*  MCV 81.1  --  81.3 81.9 81.8 81.8  PLT 256  --  218 191 192 205   Cardiac Enzymes:  Recent Labs Lab 04/21/13 0521 04/21/13 1105 04/21/13 1644  TROPONINI <0.30 <0.30 <0.30   BNP: BNP (last 3 results)  Recent Labs  04/10/13 2217 04/17/13 1010  PROBNP 2362.0* 782.6*   CBG:  Recent Labs Lab  04/23/13 1135 04/23/13 1802 04/23/13 2157 04/24/13 0801 04/24/13 1227  GLUCAP 158* 71 112* 96 123*       SignedCalvert Cantor, MD [facility] 04/24/2013, 12:51 PM

## 2013-04-27 ENCOUNTER — Encounter: Payer: Self-pay | Admitting: Internal Medicine

## 2013-04-27 ENCOUNTER — Ambulatory Visit (INDEPENDENT_AMBULATORY_CARE_PROVIDER_SITE_OTHER): Payer: 59 | Admitting: Internal Medicine

## 2013-04-27 VITALS — BP 130/84 | HR 68 | Ht 66.0 in | Wt 224.0 lb

## 2013-04-27 DIAGNOSIS — IMO0001 Reserved for inherently not codable concepts without codable children: Secondary | ICD-10-CM

## 2013-04-27 DIAGNOSIS — I2510 Atherosclerotic heart disease of native coronary artery without angina pectoris: Secondary | ICD-10-CM

## 2013-04-27 DIAGNOSIS — E669 Obesity, unspecified: Secondary | ICD-10-CM

## 2013-04-27 DIAGNOSIS — G4733 Obstructive sleep apnea (adult) (pediatric): Secondary | ICD-10-CM

## 2013-04-27 NOTE — Progress Notes (Signed)
04/27/13- 74 yoM former smoker Referred by Dr. Bryan Lemma for Obstructive Sleep Apnea Complicated by CAD/ MI/ CABG/ CM/  EF 20%/ Defibrillator, DM2, HTN COMPLAINS [id number] by Dr. Bryan Lemma.  Has not worn CPAP in 3 months due to pressure too high-states pressure is set at 20. Epworth score 6/24 Patient was diagnosed with obstructive sleep apnea based on a sleep study 10 years ago. Had been comfortable until CABG, and since then pressure has seemed to high-unable to wear CPAP. Slept better with CPAP. Bedtime between 11 PM and 1 AM, 30 minutes sleep latency, waking several times before up at 7 AM. Medical problems include hypertension, history of MI, CHF, arrhythmia, DM, prostate cancer.  Prior to Admission medications   Medication Sig Start Date End Date Taking? Authorizing Provider  aspirin EC 81 MG EC tablet Take 1 tablet (81 mg total) by mouth daily. 02/06/13  Yes Erin Barrett, PA-C  carvedilol (COREG) 12.5 MG tablet Take 0.5 tablets (6.25 mg total) by mouth 2 (two) times daily with a meal. 04/24/13  Yes Calvert Cantor, MD  docusate sodium (COLACE) 100 MG capsule Take 100 mg by mouth daily as needed for mild constipation.   Yes Historical Provider, MD  furosemide (LASIX) 80 MG tablet Take 1 tablet (80 mg total) by mouth 2 (two) times daily. 04/17/13  Yes Nada Boozer, NP  glimepiride (AMARYL) 2 MG tablet Take 1 tablet (2 mg total) by mouth daily with breakfast. 02/06/13  Yes Erin Barrett, PA-C  nitroGLYCERIN (NITROSTAT) 0.4 MG SL tablet Place 1 tablet (0.4 mg total) under the tongue every 5 (five) minutes as needed for chest pain. 04/17/13  Yes Nada Boozer, NP  pantoprazole (PROTONIX) 40 MG tablet Take 40 mg by mouth 2 (two) times daily.   Yes Historical Provider, MD  potassium chloride (K-DUR) 10 MEQ tablet Take 1 tablet (10 mEq total) by mouth 2 (two) times daily. 04/17/13  Yes Nada Boozer, NP  ramipril (ALTACE) 10 MG capsule Take 1 capsule (10 mg total) by mouth daily. 04/02/13  Yes Marykay Lex, MD  simvastatin (ZOCOR) 40 MG tablet Take 40 mg by mouth daily.   Yes Historical Provider, MD  spironolactone (ALDACTONE) 12.5 mg TABS tablet Take 0.5 tablets (12.5 mg total) by mouth 2 (two) times daily. 04/17/13  Yes Nada Boozer, NP  Ticagrelor (BRILINTA) 90 MG TABS tablet Take 1 tablet (90 mg total) by mouth 2 (two) times daily. 04/17/13  Yes Nada Boozer, NP  metFORMIN (GLUCOPHAGE) 500 MG tablet Take by mouth 2 (two) times daily with a meal.    Historical Provider, MD   Past Medical History  Diagnosis Date  . ST elevation myocardial infarction (STEMI) of inferoposterior wall, subsequent episode of care 2005    [facility]) - 100% RCA -- DES; also mCx & LAD lesions  . CAD S/P percutaneous coronary angioplasty 2005    [facility] -- DES to pRCA (culprit for STEMI) - staged DES to mCx & mLAD  . Non-ST elevation myocardial infarction (NSTEMI), subsequent episode of care 01/2013    MultiVessel CAD --> CABG  . CAD in native artery 01/2013    Cath: 50% LAD ISR with 95% post-stent; prox OM1 95%; pRCA stent ~70% ISR, mRCA 90% & 60%, rPDA 99% --> CABG  . 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 Maren Beach -- LIMA-LAD, SVG-OM 1, SVG-RPDA  . NSTEMI (non-ST elevated myocardial infarction) 04/12/2013  . CAD (coronary artery disease) of bypass graft 04/13/2013    SVG-OM & SVG-RCA  occluded; 99% anastomotic lesion in LIMA-LAD  . S/P primary angioplasty with coronary stent 04/14/2013    Complex Impella protected ROTAblator PCI to m & dRCA-PDA, PCI to LIMA-LAD  . 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 At risk for sudden cardiac death - EF now ~25% 01/25/2013  . TIA 04/15/2013    MRI of brain suggested "incidental finding of several small microembolic CVA  . Diabetes mellitus type 2 in obese     Oral medications  . Obesity, Class II, BMI 35-39.9, with comorbidity   . OSA on CPAP     needs CPAP titration  . Hypertension, essential   . Hyperlipidemia LDL goal < 70     Past Surgical History  Procedure Laterality Date  . Joint replacement    . Coronary stent placement  2005    DES to proximal RCA, mid circumflex and mid LAD in the setting of STEMI  . Coronary artery bypass graft N/A 01/30/2013    Procedure: CORONARY ARTERY BYPASS GRAFTING (CABG);  Surgeon: Kerin PernaPeter Van Trigt, MD;  [facility];  Service: Open Heart Surgery;  Laterality: N/A;  . Intraoperative transesophageal echocardiogram N/A 01/30/2013    Procedure: INTRAOPERATIVE TRANSESOPHAGEAL ECHOCARDIOGRAM;  Surgeon: Kerin PernaPeter Van Trigt, MD;  [facility];  Service: Open Heart Surgery;  Laterality: N/A;  . Transthoracic echocardiogram  01/27/2013    EF 30-35%. Mildly dilated LV with mild concentric LVH.  Apical dyskinesis with severe HK of mid-distal anterolateral/septal and inferoseptal myocardium; grade 2 diastolic function  . Coronary angioplasty with stent placement  04/15/2013    Impella supported PCI to LIMA-LAD anastomosis, proximal-mid RCA Rotablator guided PCI of the distal and proximal RCA and PDA PTCA  . Cardiac catheterization  04/13/13   Family History  Problem Relation Age of Onset  . Heart attack Mother   . Heart attack Sister   . Heart attack Brother    History   Social History  . Marital Status: Married    Spouse Name: N/A    Number of Children: N/A  . Years of Education: N/A   Occupational History  . Not on file.   Social History Main Topics  . Smoking status: Former Smoker -- 0.50 packs/day for 20 years    Types: Cigarettes    Quit date: 03/13/2003  . Smokeless tobacco: Never Used  . Alcohol Use: No  . Drug Use: No  . Sexual Activity: Not Currently   Other Topics Concern  . Not on file   Social History Narrative   He is a married, former smoker. Does not get routine exercise, but is looking forward to cardiac rehabilitation. He does not drink alcohol.   ROS-see HPI Constitutional:   No-   weight loss, night sweats, fevers, chills, fatigue, lassitude. HEENT:    No-  headaches, difficulty swallowing, +tooth/dental problems, sore throat,       No-  sneezing, itching, ear ache, nasal congestion, post nasal drip,  CV:  + chest pain, orthopnea, PND, +swelling in lower extremities, anasarca,                                                       dizziness, +palpitations Resp: +shortness of breath with exertion or at rest.  No-   productive cough,  No non-productive cough,  No- coughing up of blood.              No-   change in color of mucus.  No- wheezing.   Skin: No-   rash or lesions. GI:  +heartburn,+ indigestion, abdominal pain, nausea, vomiting, diarrhea,                 change in bowel habits, +loss of appetite GU: No-   dysuria, change in color of urine, no urgency or frequency.  No- flank pain. MS:  No-   joint pain or swelling.  No- decreased range of motion.  No- back pain. Neuro-     nothing unusual Psych:  No- change in mood or affect. No depression or anxiety.  No memory loss.  OBJ- Physical Exam General- Alert, Oriented, Affect-appropriate, Distress- none acute Skin- rash-none, lesions- none, excoriation- none Lymphadenopathy- none Head- atraumatic            Eyes- Gross vision intact, PERRLA, conjunctivae and secretions clear            Ears- Hearing, canals-normal            Nose- Clear, no-Septal dev, mucus, polyps, erosion, perforation             Throat- Mallampati III , mucosa clear , drainage- none, tonsils- atrophic Neck- flexible , trachea midline, no stridor , thyroid nl, carotid no bruit Chest - symmetrical excursion , unlabored           Heart/CV- RRR , no murmur , no gallop  , no rub, nl s1 s2                           - JVD- none , edema- none, stasis changes- none, varices- none           Lung- clear to P&A, wheeze- none, cough- none , dullness-none, rub- none           Chest wall- + VEST Pacer/ Life Vest Abd- tender-no, distended-no, bowel sounds-present, HSM- no Br/ Gen/ Rectal- Not done, not  indicated Extrem- cyanosis- none, clubbing, none, atrophy- none, strength- nl Neuro- grossly intact to observation

## 2013-04-27 NOTE — Patient Instructions (Signed)
[facility]. He says there is order already for a new CPAP machine. We need to contact the DME, Confirm order status, see what his settings are, and ask for autotitration 5-15 cwp for pressure recommendation.    Need to find sleep study ?73 yrs old?.   Dx OSA

## 2013-04-29 ENCOUNTER — Ambulatory Visit: Payer: 59 | Admitting: Cardiothoracic Surgery

## 2013-04-30 ENCOUNTER — Encounter: Payer: Self-pay | Admitting: Physician Assistant

## 2013-04-30 ENCOUNTER — Ambulatory Visit (INDEPENDENT_AMBULATORY_CARE_PROVIDER_SITE_OTHER): Payer: Medicare Other | Admitting: Physician Assistant

## 2013-04-30 VITALS — BP 120/80 | HR 72 | Ht 66.0 in | Wt 214.0 lb

## 2013-04-30 DIAGNOSIS — IMO0001 Reserved for inherently not codable concepts without codable children: Secondary | ICD-10-CM

## 2013-04-30 DIAGNOSIS — I10 Essential (primary) hypertension: Secondary | ICD-10-CM

## 2013-04-30 DIAGNOSIS — E119 Type 2 diabetes mellitus without complications: Secondary | ICD-10-CM

## 2013-04-30 DIAGNOSIS — I255 Ischemic cardiomyopathy: Secondary | ICD-10-CM

## 2013-04-30 DIAGNOSIS — E785 Hyperlipidemia, unspecified: Secondary | ICD-10-CM

## 2013-04-30 DIAGNOSIS — E669 Obesity, unspecified: Secondary | ICD-10-CM

## 2013-04-30 DIAGNOSIS — Z951 Presence of aortocoronary bypass graft: Secondary | ICD-10-CM

## 2013-04-30 DIAGNOSIS — I2589 Other forms of chronic ischemic heart disease: Secondary | ICD-10-CM

## 2013-04-30 DIAGNOSIS — I2510 Atherosclerotic heart disease of native coronary artery without angina pectoris: Secondary | ICD-10-CM

## 2013-04-30 DIAGNOSIS — Z9861 Coronary angioplasty status: Secondary | ICD-10-CM

## 2013-04-30 NOTE — Assessment & Plan Note (Signed)
Currently treated with Zocor 40 mg

## 2013-04-30 NOTE — Assessment & Plan Note (Signed)
Blood pressure well controlled on current regimen

## 2013-04-30 NOTE — Assessment & Plan Note (Signed)
Currently asymptomatic.  On aspirin, brilinta, coreg, ramipril

## 2013-04-30 NOTE — Patient Instructions (Signed)
1.  Weigh yourself tomorrow.  This will be your dry weight. 2.  If you gain 2 pounds in 24 hours or 5 pounds in a week, add the evening dose of lasix back until your weight goes back to baseline and then stop the evening dose.  If your weight does not go down, cal our office. 3.  Follow up with Dr. Herbie BaltimoreHarding in 3 months. 4.  New echocardiogram in 2.5 months.

## 2013-04-30 NOTE — Assessment & Plan Note (Signed)
Patient reported an episode or 2 of lightheadedness which actually improved when he stood up. I have asked him to recheck his blood sugar if he has another episode as it may be related to hypoglycemia.

## 2013-04-30 NOTE — Progress Notes (Signed)
Date:  04/30/2013   ID:  [name], DOB 1940/04/28, MRN [id number]  PCP:  Corky Downs, MD  Primary Cardiologist:   Herbie Baltimore    History of Present Illness: Earl Williamson is a 73 y.o. male w/ complex cardiac history w/ severe residual CAD w/ failed grafts s/p CABG 01/2013. Recently admitted by Cardiology on 2/3 for PCI and stent to LIMA-LAD as well as Impella w/ PCI and stent to RCA. Discharged to home on 2/6 on brilinta, aspirin, ace I, bb, and life vest. Presented to [facility] on 2/9 w/ 1 day h/o cramping abd pain primarily over LLQ, N/V and orthostatic hypotension. CT abd showed: Thickening of small bowel loops in the anatomic pelvis compatible with enteritis. PCCM admitted given the findings of hypotension, and new AKI.  2-D echocardiogram on February 5 revealed an ejection fraction of 30-35%  Grade 1 diastolic dysfunction. Left atrium is mildly dilated. The right ventricle was mildly dilated.  Patient presents today for a posthospital evaluation. He reports doing very well. He's been watching his diet closely. He did have an opportunity to meet with a registered dietitian prior to his rehospitalization and plans to continue those visits in the near future. He also asked about starting cardiac rehabilitation.  He has not been watching his weight on a daily basis we did discuss doing that going forward.  The patient currently denies nausea, vomiting, fever, chest pain, shortness of breath, orthopnea, dizziness, PND, cough, congestion, abdominal pain, hematochezia, melena, lower extremity edema, claudication.  Wt Readings from Last 3 Encounters:  04/30/13 214 lb (97.07 kg)  04/27/13 224 lb (101.606 kg)  04/24/13 229 lb 15 oz (104.3 kg)     Past Medical History  Diagnosis Date  . ST elevation myocardial infarction (STEMI) of inferoposterior wall, subsequent episode of care 2005    [facility]) - 100% RCA -- DES; also mCx & LAD lesions  . CAD S/P percutaneous coronary angioplasty 2005   [facility] -- DES to pRCA (culprit for STEMI) - staged DES to mCx & mLAD  . Non-ST elevation myocardial infarction (NSTEMI), subsequent episode of care 01/2013    MultiVessel CAD --> CABG  . CAD in native artery 01/2013    Cath: 50% LAD ISR with 95% post-stent; prox OM1 95%; pRCA stent ~70% ISR, mRCA 90% & 60%, rPDA 99% --> CABG  . 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 Maren Beach -- LIMA-LAD, SVG-OM 1, SVG-RPDA  . NSTEMI (non-ST elevated myocardial infarction) 04/12/2013  . CAD (coronary artery disease) of bypass graft 04/13/2013    SVG-OM & SVG-RCA occluded; 99% anastomotic lesion in LIMA-LAD  . S/P primary angioplasty with coronary stent 04/14/2013    Complex Impella protected ROTAblator PCI to m & dRCA-PDA, PCI to LIMA-LAD  . 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 At risk for sudden cardiac death - EF now ~25% 02-01-2013  . TIA 04/15/2013    MRI of brain suggested "incidental finding of several small microembolic CVA  . Diabetes mellitus type 2 in obese     Oral medications  . Obesity, Class II, BMI 35-39.9, with comorbidity   . OSA on CPAP     needs CPAP titration  . Hypertension, essential   . Hyperlipidemia LDL goal < 70     Current Outpatient Prescriptions  Medication Sig Dispense Refill  . aspirin EC 81 MG EC tablet Take 1 tablet (81 mg total) by mouth daily.      Marland Kitchen  carvedilol (COREG) 12.5 MG tablet Take 0.5 tablets (6.25 mg total) by mouth 2 (two) times daily with a meal.  60 tablet  6  . docusate sodium (COLACE) 100 MG capsule Take 100 mg by mouth daily as needed for mild constipation.      . furosemide (LASIX) 80 MG tablet Take 1 tablet (80 mg total) by mouth 2 (two) times daily.  60 tablet  6  . glimepiride (AMARYL) 2 MG tablet Take 1 tablet (2 mg total) by mouth daily with breakfast.  30 tablet  3  . nitroGLYCERIN (NITROSTAT) 0.4 MG SL tablet Place 1 tablet (0.4 mg total) under the tongue every 5 (five) minutes as needed for chest pain.   25 tablet  4  . pantoprazole (PROTONIX) 40 MG tablet Take 40 mg by mouth 2 (two) times daily.      . potassium chloride (K-DUR) 10 MEQ tablet Take 1 tablet (10 mEq total) by mouth 2 (two) times daily.  60 tablet  6  . ramipril (ALTACE) 10 MG capsule Take 1 capsule (10 mg total) by mouth daily.  90 capsule  3  . simvastatin (ZOCOR) 40 MG tablet Take 40 mg by mouth daily.      Marland Kitchen. spironolactone (ALDACTONE) 12.5 mg TABS tablet Take 0.5 tablets (12.5 mg total) by mouth 2 (two) times daily.  30 tablet  6  . Ticagrelor (BRILINTA) 90 MG TABS tablet Take 1 tablet (90 mg total) by mouth 2 (two) times daily.  60 tablet  11   No current facility-administered medications for this visit.    Allergies:   No Known Allergies  Social History:  The patient  reports that he quit smoking about 10 years ago. His smoking use included Cigarettes. He has a 10 pack-year smoking history. He has never used smokeless tobacco. He reports that he does not drink alcohol or use illicit drugs.   Family history:   Family History  Problem Relation Age of Onset  . Heart attack Mother   . Heart attack Sister   . Heart attack Brother     ROS:  Please see the history of present illness.  All other systems reviewed and negative.   PHYSICAL EXAM: VS:  BP 120/80  Pulse 72  Ht 5\' 6"  (1.676 m)  Wt 214 lb (97.07 kg)  BMI 34.56 kg/m2 Obese, well developed, in no acute distress HEENT: Pupils are equal round react to light accommodation extraocular movements are intact.  Neck: no JVD Cardiac: Regular rate and rhythm without murmurs rubs or gallops. Lungs:  clear to auscultation bilaterally, no wheezing, rhonchi or rales Ext: no lower extremity edema.  2+ radial and dorsalis pedis pulses. Skin: warm and dry Neuro:  Grossly normal   ASSESSMENT AND PLAN:  Problem List Items Addressed This Visit   CAD S/P percutaneous coronary angioplasty - PCI  x 3 stents @ [facility]; CABG x 3 01/30/13 ; PCI to LIMA-LAD anastomosis, as well as  proximal and distal RCA (Impella supported. - Primary (Chronic)     Currently asymptomatic.  On aspirin, brilinta, coreg, ramipril    HTN (hypertension), benign (Chronic)     Blood pressure well controlled on current regimen.    HLD (hyperlipidemia) (Chronic)     Currently treated with Zocor 40 mg    Cardiomyopathy, ischemic, EF 30-35%, 01/27/13 - now roughly 20% by LV gram February 2015 (Chronic)     Patient is well compensated and appears euvolemic. However, I am concerned that his weight is dropping  too much as he is down to 214 pounds.  I asked him to weigh himself every morning. He will use tomorrow's weight as his dry weight. He will decrease Lasix to 80 mg in the morning.  If he gains 2 pounds in 24 hours, or 5 pounds in a week, he will add back the evening dose of Lasix until his weight reaches baseline.  Continue lifevest  New 2-D echocardiogram in 2 and half months.    Obesity, Class II, BMI 35-39.9, with comorbidity (Chronic)     We'll continue to see the registered dietitian that he was referred to previously.    DM type 2 (diabetes mellitus, type 2)     Patient reported an episode or 2 of lightheadedness which actually improved when he stood up. I have asked him to recheck his blood sugar if he has another episode as it may be related to hypoglycemia.    S/P CABG x 3

## 2013-04-30 NOTE — Assessment & Plan Note (Addendum)
Patient is well compensated and appears euvolemic. However, I am concerned that his weight is dropping too much as he is down to 214 pounds.  I asked him to weigh himself every morning. He will use tomorrow's weight as his dry weight. He will decrease Lasix to 80 mg in the morning.  If he gains 2 pounds in 24 hours, or 5 pounds in a week, he will add back the evening dose of Lasix until his weight reaches baseline.  Continue lifevest  New 2-D echocardiogram in 2 and half months.

## 2013-04-30 NOTE — Assessment & Plan Note (Signed)
We'll continue to see the registered dietitian that he was referred to previously.

## 2013-05-05 ENCOUNTER — Telehealth: Payer: Self-pay | Admitting: Pharmacist

## 2013-05-05 DIAGNOSIS — I5022 Chronic systolic (congestive) heart failure: Secondary | ICD-10-CM

## 2013-05-05 NOTE — Telephone Encounter (Signed)
Aldosterone Receptor Antagonist Initiation Clinic Study - Pharmacist Follow-Up Patient was contacted via telephone today in efforts to discuss the following:  Spironolactone Order Date: 04/17/13, picked upon 02/07 but complicated with ED admission 02/09; restart 02/13 (will count this as Day 1) Last BMET: 04/24/13, K+ 3.8, SCr 0.90    Medication Availability: yes   BMET labs were reviewed with patient    Reinforced benefits vs risks of spironolactone, including its indication, interactions, importance of adherence, and management of missed doses.   Has the patient been measuring his/her own blood pressure or blood glucose at home?  Yes, sugars were 130s previously. Most recent lab however has shown glucose in the 80s.    Does the patient  feel that his/her medications are working for him/her?  yes   Has the patient been experiencing any side effects to the medications prescribed?  Yes, dizziness and feeling of "things aren't right," but resolved with sugar candies    Understanding of regimen: fair   Understanding of indications: fair   Potential for compliance: good  Assessment & Plan:   [name] initially started on spironolactone on 2/06 after recent admission to the hospital for NSTEMI 2/2 failed CABG now s/p PTCA and stent to LIMA-LAD, arthrectomy/PTCA/stent to mid RCA, PTCA+stent PDA. Patient then was re-admitted on 2/10-2/13 for abdominal pain thought to be enteritis. Spironolactone was resumed upon discharge. I will schedule a repeat lab visit in one month. Patient currently reports no issues with spironolactone but does ask about dizziness, which seems to resolve after eating a couple sugar candies. He is working to schedule an appointment with his PCP to address his diabetes. This could also be low blood pressure given his last reading of 120/90. Will have to follow closely. In regards to gynecomastia risk, patient did ask how he would know when he "is already large."  Soreness and breast tenderness explained and contact number given in case he has questions.  Next scheduled BMET: Tentatively May 22, 2013 (will confirm sometime next week  Hallie Ertl B. Artelia Larocheung, PharmD Clinical Pharmacist - Resident Phone: [phone] Pager: [phone] 05/05/2013 2:58 PM

## 2013-05-12 ENCOUNTER — Ambulatory Visit (HOSPITAL_COMMUNITY): Payer: 59

## 2013-05-14 ENCOUNTER — Encounter: Payer: Self-pay | Admitting: Cardiothoracic Surgery

## 2013-05-14 ENCOUNTER — Ambulatory Visit (INDEPENDENT_AMBULATORY_CARE_PROVIDER_SITE_OTHER): Payer: 59 | Admitting: Cardiothoracic Surgery

## 2013-05-14 VITALS — BP 104/59 | HR 74 | Resp 20 | Ht 66.0 in | Wt 214.0 lb

## 2013-05-14 DIAGNOSIS — I2510 Atherosclerotic heart disease of native coronary artery without angina pectoris: Secondary | ICD-10-CM

## 2013-05-14 DIAGNOSIS — Z951 Presence of aortocoronary bypass graft: Secondary | ICD-10-CM

## 2013-05-14 NOTE — Progress Notes (Signed)
PCP is MASOUD,JAVED, MD Referring Provider is Corky Downs, MD  Chief Complaint  Patient presents with  . Routine Post Op    6 week f/u    HPI: Final office visit after multivessel CABG 3 months ago for unstable angina. One month ago the patient required placement of a stent to the LAD-LIMA anastomosis for de-airing do to severe calcification and thickening of the LAD with good result. The patient also had a PCI with several stents placed to the right coronary for closure of a graft related to poor outflow distal disease. The patient is on Brillenta. The patient has not yet started outpatient cardiac rehabilitation at [facility]. He currently has a life vest and is instructed not to drive. He is followed by Dr. Herbie Baltimore.   1 month ago the patient had a CT of the chest which shows the sternal incision to be healing well--no retrosternal fluid collection.  Past Medical History  Diagnosis Date  . ST elevation myocardial infarction (STEMI) of inferoposterior wall, subsequent episode of care 2005    [facility]) - 100% RCA -- DES; also mCx & LAD lesions  . CAD S/P percutaneous coronary angioplasty 2005    [facility] -- DES to pRCA (culprit for STEMI) - staged DES to mCx & mLAD  . Non-ST elevation myocardial infarction (NSTEMI), subsequent episode of care 01/2013    MultiVessel CAD --> CABG  . CAD in native artery 01/2013    Cath: 50% LAD ISR with 95% post-stent; prox OM1 95%; pRCA stent ~70% ISR, mRCA 90% & 60%, rPDA 99% --> CABG  . 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 Maren Beach -- LIMA-LAD, SVG-OM 1, SVG-RPDA  . NSTEMI (non-ST elevated myocardial infarction) 04/12/2013  . CAD (coronary artery disease) of bypass graft 04/13/2013    SVG-OM & SVG-RCA occluded; 99% anastomotic lesion in LIMA-LAD  . S/P primary angioplasty with coronary stent 04/14/2013    Complex Impella protected ROTAblator PCI to m & dRCA-PDA, PCI to LIMA-LAD  . 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 At risk for sudden cardiac death - EF now ~25% 02/07/13  . TIA 04/15/2013    MRI of brain suggested "incidental finding of several small microembolic CVA  . Diabetes mellitus type 2 in obese     Oral medications  . Obesity, Class II, BMI 35-39.9, with comorbidity   . OSA on CPAP     needs CPAP titration  . Hypertension, essential   . Hyperlipidemia LDL goal < 70     Past Surgical History  Procedure Laterality Date  . Joint replacement    . Coronary stent placement  2005    DES to proximal RCA, mid circumflex and mid LAD in the setting of STEMI  . Coronary artery bypass graft N/A 01/30/2013    Procedure: CORONARY ARTERY BYPASS GRAFTING (CABG);  Surgeon: Kerin Perna, MD;  Location: [facility] OR;  Service: Open Heart Surgery;  Laterality: N/A;  . Intraoperative transesophageal echocardiogram N/A 01/30/2013    Procedure: INTRAOPERATIVE TRANSESOPHAGEAL ECHOCARDIOGRAM;  Surgeon: Kerin Perna, MD;  Location: [facility] OR;  Service: Open Heart Surgery;  Laterality: N/A;  . Transthoracic echocardiogram  01/27/2013    EF 30-35%. Mildly dilated LV with mild concentric LVH.  Apical dyskinesis with severe HK of mid-distal anterolateral/septal and inferoseptal myocardium; grade 2 diastolic function  . Coronary angioplasty with stent placement  04/15/2013    Impella supported PCI to LIMA-LAD  anastomosis, proximal-mid RCA Rotablator guided PCI of the distal and proximal RCA and PDA PTCA  . Cardiac catheterization  04/13/13    Family History  Problem Relation Age of Onset  . Heart attack Mother   . Heart attack Sister   . Heart attack Brother     Social History History  Substance Use Topics  . Smoking status: Former Smoker -- 0.50 packs/day for 20 years    Types: Cigarettes    Quit date: 03/13/2003  . Smokeless tobacco: Never Used  . Alcohol Use: No    Current Outpatient Prescriptions  Medication Sig Dispense Refill  . aspirin EC 81 MG EC tablet Take 1 tablet (81 mg total) by  mouth daily.      . carvedilol (COREG) 12.5 MG tablet Take 0.5 tablets (6.25 mg total) by mouth 2 (two) times daily with a meal.  60 tablet  6  . docusate sodium (COLACE) 100 MG capsule Take 100 mg by mouth daily as needed for mild constipation.      . furosemide (LASIX) 80 MG tablet Take 1 tablet (80 mg total) by mouth 2 (two) times daily.  60 tablet  6  . glimepiride (AMARYL) 2 MG tablet Take 1 tablet (2 mg total) by mouth daily with breakfast.  30 tablet  3  . metFORMIN (GLUCOPHAGE) 500 MG tablet Take by mouth 2 (two) times daily with a meal.      . nitroGLYCERIN (NITROSTAT) 0.4 MG SL tablet Place 1 tablet (0.4 mg total) under the tongue every 5 (five) minutes as needed for chest pain.  25 tablet  4  . pantoprazole (PROTONIX) 40 MG tablet Take 40 mg by mouth 2 (two) times daily.      . potassium chloride (K-DUR) 10 MEQ tablet Take 1 tablet (10 mEq total) by mouth 2 (two) times daily.  60 tablet  6  . ramipril (ALTACE) 10 MG capsule Take 1 capsule (10 mg total) by mouth daily.  90 capsule  3  . simvastatin (ZOCOR) 40 MG tablet Take 40 mg by mouth daily.      Marland Kitchen. spironolactone (ALDACTONE) 12.5 mg TABS tablet Take 0.5 tablets (12.5 mg total) by mouth 2 (two) times daily.  30 tablet  6  . Ticagrelor (BRILINTA) 90 MG TABS tablet Take 1 tablet (90 mg total) by mouth 2 (two) times daily.  60 tablet  11   No current facility-administered medications for this visit.    No Known Allergies  Review of Systems patient understands he needs to follow a better diet and he was told to walk 10 minutes at least 4 days a week. He is encouraged to start outpatient cardiac rehabilitation when his life vest is off and he can drive  BP 409/81104/59  Pulse 74  Resp 20  Ht 5\' 6"  (1.676 m)  Wt 214 lb (97.07 kg)  BMI 34.56 kg/m2  SpO2 99% Physical Exam  alert and comfortable, the patient lost considerable weight   lungs are clear Heart rate is regular without murmur No peripheral edema, extremities warm     Diagnostic Tests:  none   Impression:  now doing well after extensive hospitalizations and multi-problems.  He'll continue his current medications He will start cardiac rehabilitation at [facility] when authorized by his cardiologist  Return as needed

## 2013-05-15 ENCOUNTER — Ambulatory Visit: Payer: 59 | Admitting: Cardiothoracic Surgery

## 2013-05-22 ENCOUNTER — Encounter: Payer: Self-pay | Admitting: Internal Medicine

## 2013-05-22 ENCOUNTER — Ambulatory Visit (INDEPENDENT_AMBULATORY_CARE_PROVIDER_SITE_OTHER): Payer: 59

## 2013-05-22 ENCOUNTER — Telehealth: Payer: Self-pay | Admitting: Internal Medicine

## 2013-05-22 DIAGNOSIS — I5022 Chronic systolic (congestive) heart failure: Secondary | ICD-10-CM

## 2013-05-22 DIAGNOSIS — G4733 Obstructive sleep apnea (adult) (pediatric): Secondary | ICD-10-CM

## 2013-05-22 NOTE — Telephone Encounter (Signed)
ATC sleep med again no answer wcb

## 2013-05-22 NOTE — Telephone Encounter (Signed)
ATC sleep med. Phone line rang several times and no answer and no VM WCB

## 2013-05-22 NOTE — Assessment & Plan Note (Signed)
Educated weight loss would help sleep apnea control

## 2013-05-22 NOTE — Assessment & Plan Note (Signed)
[facility]. Meanwhile try titration for pressure recommendation

## 2013-05-23 LAB — BASIC METABOLIC PANEL
BUN / CREAT RATIO: 21 (ref 10–22)
BUN: 30 mg/dL — ABNORMAL HIGH (ref 8–27)
CO2: 23 mmol/L (ref 18–29)
Calcium: 9.6 mg/dL (ref 8.6–10.2)
Chloride: 97 mmol/L (ref 97–108)
Creatinine, Ser: 1.44 mg/dL — ABNORMAL HIGH (ref 0.76–1.27)
GFR, EST AFRICAN AMERICAN: 56 mL/min/{1.73_m2} — AB (ref >59)
GFR, EST NON AFRICAN AMERICAN: 49 mL/min/{1.73_m2} — AB (ref >59)
GLUCOSE: 135 mg/dL — AB (ref 65–99)
POTASSIUM: 4.6 mmol/L (ref 3.5–5.2)
Sodium: 142 mmol/L (ref 134–144)

## 2013-05-25 ENCOUNTER — Telehealth: Payer: Self-pay | Admitting: *Deleted

## 2013-05-25 DIAGNOSIS — I509 Heart failure, unspecified: Secondary | ICD-10-CM

## 2013-05-25 NOTE — Telephone Encounter (Signed)
Message copied by Fransico SettersBAUCOM, Theo Krumholz E on Mon May 25, 2013  9:42 AM ------      Message from: Marykay LexHARDING, DAVID W      Created: Sat May 23, 2013  3:42 PM       Kidney function is a bit off -- Hold spironolactone x 4 days, drink plenty of water -- recheck next Friday 3/20.            Marykay LexHARDING,DAVID W, MD       ------

## 2013-05-26 ENCOUNTER — Telehealth: Payer: Self-pay | Admitting: Pharmacist

## 2013-05-26 NOTE — Telephone Encounter (Signed)
Aldosterone Receptor Antagonist Initiation Clinic Study - Pharmacist Monthly Follow-Up Patient was contacted via telephone today in efforts to discuss the following:  Spironolactone Order Date: 04/24/13 Last BMET: 05/22/13, K+ 4.6, SCr 1.44 (up from 0.90)    Medication Availability: yes   BMET labs were reviewed with patient    Reinforced benefits vs risks of spironolactone, including its indication, interactions, importance of adherence, and management of missed doses.   Does the patient  feel that his/her medications are working for him/her?  no   Has the patient been experiencing any side effects to the medications prescribed?  yes   Understanding of regimen: good   Understanding of indications: good   Potential for compliance: good  Assessment & Plan:   Spoke to patient today, who was instructed to hold spironolactone x 4 days (starting yesterday) d/t significant bump in SCr from labs on 05/22/13. Patient is scheduled to see Dr. Herbie BaltimoreHarding tomorrow and to recheck labs on 05/29/13. Patient denies any complaints/concerns with spironolactone, though he does mention that he believes the lasix is causing him to become "too dry," has been experiencing some leg cramps as well. He has also been experiencing some GI upset with metformin which he says he will talk to Dr. Juel BurrowMasoud about. Dizziness that was previously reported seems to be resolved at this time.  Next scheduled BMET: Friday May 29, 2013.  Earl NineAlvin B. Earl Williamson, PharmD Clinical Pharmacist - Resident Phone: [phone] Pager: [phone] 05/26/2013 10:11 AM

## 2013-05-26 NOTE — Telephone Encounter (Signed)
ATC again office doe snot open until 9am. Carron CurieJennifer Jamyson Jirak, CMA

## 2013-05-27 ENCOUNTER — Ambulatory Visit (INDEPENDENT_AMBULATORY_CARE_PROVIDER_SITE_OTHER): Payer: Medicare Other | Admitting: Cardiology

## 2013-05-27 ENCOUNTER — Encounter: Payer: Self-pay | Admitting: Cardiology

## 2013-05-27 VITALS — BP 122/68 | HR 73 | Ht 66.0 in | Wt 218.5 lb

## 2013-05-27 DIAGNOSIS — I214 Non-ST elevation (NSTEMI) myocardial infarction: Secondary | ICD-10-CM

## 2013-05-27 DIAGNOSIS — Z789 Other specified health status: Secondary | ICD-10-CM

## 2013-05-27 DIAGNOSIS — Z9861 Coronary angioplasty status: Secondary | ICD-10-CM

## 2013-05-27 DIAGNOSIS — I2510 Atherosclerotic heart disease of native coronary artery without angina pectoris: Secondary | ICD-10-CM

## 2013-05-27 DIAGNOSIS — N179 Acute kidney failure, unspecified: Secondary | ICD-10-CM

## 2013-05-27 DIAGNOSIS — I5042 Chronic combined systolic (congestive) and diastolic (congestive) heart failure: Secondary | ICD-10-CM

## 2013-05-27 DIAGNOSIS — E785 Hyperlipidemia, unspecified: Secondary | ICD-10-CM

## 2013-05-27 DIAGNOSIS — IMO0001 Reserved for inherently not codable concepts without codable children: Secondary | ICD-10-CM

## 2013-05-27 DIAGNOSIS — Z9189 Other specified personal risk factors, not elsewhere classified: Secondary | ICD-10-CM

## 2013-05-27 DIAGNOSIS — I255 Ischemic cardiomyopathy: Secondary | ICD-10-CM

## 2013-05-27 DIAGNOSIS — E669 Obesity, unspecified: Secondary | ICD-10-CM

## 2013-05-27 DIAGNOSIS — I2589 Other forms of chronic ischemic heart disease: Secondary | ICD-10-CM

## 2013-05-27 DIAGNOSIS — I509 Heart failure, unspecified: Secondary | ICD-10-CM

## 2013-05-27 DIAGNOSIS — Z951 Presence of aortocoronary bypass graft: Secondary | ICD-10-CM

## 2013-05-27 NOTE — Telephone Encounter (Signed)
Lillia AbedLindsay from [facility] is returning call.

## 2013-05-27 NOTE — Telephone Encounter (Signed)
Please ask DME what his pressure settings are, so I can correct note and decide about titration.

## 2013-05-27 NOTE — Telephone Encounter (Signed)
lmomtcb x1 for lindsay 

## 2013-05-27 NOTE — Telephone Encounter (Signed)
Called # and LMTCB x1 for Earl Williamson

## 2013-05-27 NOTE — Patient Instructions (Addendum)
Continue taking Lasix  Once a day.   may have labs drawn on Monday 23 ,2015, after reviewing labs, will let you know when or if to restart  Spirolactone.  Keep wearing the LIFEVEST  .Your physician has requested that you have an echocardiogram. Echocardiography is a painless test that uses sound waves to create images of your heart. It provides your doctor with information about the size and shape of your heart and how well your heart's chambers and valves are working. This procedure takes approximately one hour. There are no restrictions for this procedure. Will be schedule in  May 2015 [facility]   Your physician wants you to follow-up in May 2015 in [place] --Dr Herbie BaltimoreHarding. You will receive a reminder letter in the mail two months in advance. If you don't receive a letter, please call our office to schedule the follow-up appointment.

## 2013-05-27 NOTE — Telephone Encounter (Signed)
I called and spoke w/ Lillia AbedLindsay. She reports they have that pt is on BIPAP and has been for a while. According to Dr. Roxy CedarYoung's note he is on CPAP and we sent order for CPAP titration. She is wanting clarification. Please advise thanks

## 2013-05-28 NOTE — Telephone Encounter (Signed)
Mardella LaymanLindsey calling from sleep med a/b this pt. pls return call can be reached @ [phone].Caren GriffinsStanley A Dalton

## 2013-05-28 NOTE — Telephone Encounter (Signed)
lmtcb for Time WarnerLindsay x1

## 2013-05-29 ENCOUNTER — Other Ambulatory Visit: Payer: 59

## 2013-05-29 NOTE — Telephone Encounter (Signed)
Mardella LaymanLindsey returned call & can be reached at [phone].  Antionette FairyHolly D Pryor

## 2013-05-29 NOTE — Telephone Encounter (Signed)
[phone] Earl LaymanLindsey Williamson to talk to a nurse regarding this message

## 2013-05-29 NOTE — Telephone Encounter (Signed)
lmomtcb x1 for Earl Williamson 

## 2013-05-29 NOTE — Telephone Encounter (Signed)
lmtcb x1 

## 2013-05-30 ENCOUNTER — Encounter: Payer: Self-pay | Admitting: Cardiology

## 2013-05-30 DIAGNOSIS — I5042 Chronic combined systolic (congestive) and diastolic (congestive) heart failure: Secondary | ICD-10-CM | POA: Insufficient documentation

## 2013-05-30 NOTE — Progress Notes (Signed)
PCP: Corky Downs, MD  Clinic Note: Chief Complaint  Patient presents with  . 2 month visit    wearing a life vest, no chest pain , sob  every now and than, no no edema, chest is sor occassional around chest incision,  diarrhea @3rd  day , become nauseated  and dizzines, sometime has appetite., restatr taking metformin  - feels better if he takes it once a day- appt with PCP Ion 06/02/13.holding spirolactone for 4 days .    HPI: Earl Williamson is a 73 y.o. male with a Cardiovascular Problem List below who presents today for his first followup visit with me after his very complicated last few months. He has significant coronary artery disease status post CABG with vein grafts occluded as well as multiple different PCI's most recently in January of 2015. He first became known to me in November of 2014 when he presented with non-STEMI. His medical history including inferior posterior STEMI that because of it with RCA PCI then followed by circumflex and LAD stenting. At times non-STEMI that is 14 he was demonstrated to have severe multivessel disease and underwent three-vessel CABG. Unfortunately, in the end of January he presented again with another non-STEMI he was taken to the cardiac catheterization lab and found to have significant disease in the RCA and LAD with disease in the LIMA-LAD anastomosis and occluded vein grafts to the RCA and circumflex. He was also noted to have an EF down to 20-25%. He underwent high risk Impella supported PCI on the LIMA-LAD insertion as well as Rotablator atherectomy prepared PCI of the native RCA. Subsequently after discharge he was then readmitted with what looked like bowel ischemia thought to be possible embolic.  His most recent echocardiogram revealed an EF of 30-35% several days post PCI. This demonstrated a relatively significant improvement with PCI alone.  He was discharged with a LifeVest. He had a near syncopal episode post cath, code stroke was called and  an MRI was performed which showed small foci of likely embolic phenomenon head did not go along with his symptoms.  Interval History: Today presents doing relatively well. His is a life vest is somewhat uncomfortable when he gets that hot weather. He is not having any anginal symptoms to speak. He just has a lot of stomach issues ever since restarting metformin. He gets somewhat sick to stomach with nausea but no vomiting. He has had a little but of area of late. He actually had some labs checked on him that showed an increased creatinine that had been backing off on his Lasix and spironolactone. Is due for a recheck in a week. He says that he is short of breath every now and then when doing some activities but not rest. His sore around his chest incision site but not having any anginal symptoms with rest or exertion. He is not really doing much exertion besides his walking around house. No recent symptoms of tachycardia or rapid irregular palpitations. No syncope or near syncope. No TIA or amaurosis fugax-type symptoms. No melena, hematochezia hematuria. No nosebleeds. He has not been doing much living ambulation to determine if he is having claudication. He says his been trying to watch his weight and control his diet, but really hasn't been feeling as well he would like and therefore probably is not eating that much anyway. He had infectious gastroenteritis not that long ago and has really does not do it himself.  Past Medical History  Diagnosis Date  . ST elevation  myocardial infarction (STEMI) of inferoposterior wall, subsequent episode of care 2005    [facility]) - 100% RCA -- DES; also mCx & LAD lesions  . CAD S/P percutaneous coronary angioplasty 2005    [facility] -- DES to pRCA (culprit for STEMI) - staged DES to mCx & mLAD  . Non-ST elevation myocardial infarction (NSTEMI), subsequent episode of care 01/2013    MultiVessel CAD --> CABG  . CAD in native artery 01/2013    Cath: 50% LAD ISR with 95%  post-stent; prox OM1 95%; pRCA stent ~70% ISR, mRCA 90% & 60%, rPDA 99% --> CABG  . 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 Maren Beach -- LIMA-LAD, SVG-OM 1, SVG-RPDA  . NSTEMI (non-ST elevated myocardial infarction) 04/12/2013  . CAD (coronary artery disease) of bypass graft 04/13/2013    SVG-OM & SVG-RCA occluded; 99% anastomotic lesion in LIMA-LAD  . S/P primary angioplasty with coronary stent 04/14/2013    Complex Impella protected ROTAblator PCI to m & dRCA-PDA, PCI to LIMA-LAD  . 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 At risk for sudden cardiac death - EF now ~25% 02/03/2013  . TIA 04/15/2013    MRI of brain suggested "incidental finding of several small microembolic CVA  . Diabetes mellitus type 2 in obese     Oral medications  . Obesity, Class II, BMI 35-39.9, with comorbidity   . OSA on CPAP     needs CPAP titration  . Hypertension, essential   . Hyperlipidemia LDL goal < 70     Prior Cardiac Evaluation and Past Surgical History: Past Surgical History  Procedure Laterality Date  . Joint replacement    . Coronary stent placement  2005    DES to proximal RCA, mid circumflex and mid LAD in the setting of STEMI  . Coronary artery bypass graft N/A 01/30/2013    Procedure: CORONARY ARTERY BYPASS GRAFTING (CABG);  Surgeon: Kerin Perna, MD;  Location[facility] OR;  Service: Open Heart Surgery;  Laterality: N/A;  . Intraoperative transesophageal echocardiogram N/A 01/30/2013    Procedure: INTRAOPERATIVE TRANSESOPHAGEAL ECHOCARDIOGRAM;  Surgeon: Kerin Perna, MD;  Location: [facility] OR;  Service: Open Heart Surgery;  Laterality: N/A;  . Transthoracic echocardiogram  01/27/2013    EF 30-35%. Mildly dilated LV with mild concentric LVH.  Apical dyskinesis with severe HK of mid-distal anterolateral/septal and inferoseptal myocardium; grade 2 diastolic function  . Coronary angioplasty with stent placement  04/15/2013    Impella supported PCI to LIMA-LAD  anastomosis, proximal-mid RCA Rotablator guided PCI of the distal and proximal RCA and PDA PTCA  . Cardiac catheterization  04/13/13  . Transthoracic echocardiogram  04/16/2013    EF 30-35%. Severe concentric LVH. Moderately severe to function. Grade 1 diastolic function. Mildly dilated RV and RA.    MEDICATIONS AND ALLERGIES REVIEWED IN EPIC No Change in Social and Family History  ROS: A comprehensive Review of Systems - Negative except Symptoms are in history of present illness. Notable positives include the nausea an upset stomach with recent diarrhea but no vomiting. Mild myalgias and arthralgias. Lack of desire to do activities.  PHYSICAL EXAM BP 122/68  Pulse 73  Ht 5\' 6"  (1.676 m)  Wt 218 lb 8 oz (99.111 kg)  BMI 35.28 kg/m2 Filed Weights   05/27/13 1559  Weight: 218 lb 8 oz (99.111 kg)   General appearance: alert, cooperative, appears stated age, no distress and moderately obese Neck:  no adenopathy, no carotid bruit and no JVD Lungs: clear to auscultation bilaterally, normal percussion bilaterally and non-labored Heart: regular rate and rhythm, S1, S2 normal, no murmur, click, rub or gallop; nondisplaced PMI Abdomen: soft, non-tender; bowel sounds normal; no masses,  no organomegaly; moderate obesity Extremities: extremities normal, atraumatic, no cyanosis, and edema --trace Pulses: 2+ and symmetric;  Neurologic: Mental status: Alert, oriented, thought content appropriate; Cranial nerves: normal (II-XII grossly intact)   Adult ECG Report  Rate: 73 ;  Rhythm: normal sinus rhythm and premature ventricular contractions (PVC)   Narrative Interpretation: Septal infarct age undetermined, inferior infarct age undetermined.  Recent Labs: 04/16/2013: TC 140, HDL 36 and LDL 74, TG 151. Relatively stable with improved LDL but increased TG decreased HDL  ASSESSMENT / PLAN:  NSTEMI (non-ST elevated myocardial infarction), 2015 Yet again another MI -- his third MI being in January of  this year. It is not clear which area was probably more effective than this most recent MI. It was not as sizable as the MI back in November, but more unfortunate based on the vein grafts being occluded.  Cardiomyopathy, ischemic, EF 30-35%, 01/27/13 - now roughly 20% by LV gram February 2015 Currently wearing LifeVest -- plan would be to recheck echocardiogram and late April, early May.  He is on aspirin, carvedilol, ramipril, spironolactone. All at reasonable doses. No active heart failure symptoms besides the lack of motivation. I am inclined to think is probably more do to his stomach problems from metformin.  CAD S/P percutaneous coronary angioplasty - PCI  x 3 stents @ [facility]; CABG x 3 01/30/13 ; PCI to LIMA-LAD anastomosis, as well as proximal and distal RCA (Impella supported. Currently not having any anginal pain. He is on the ischemic cardiopathy regimen noted above. Also on statin plus aspirin / Brilinta. No bleeding issues. No dyspnea related to Brilinta. He is also on a PPI for GI prophylaxis while on DAPT.  S/P CABG x 3 Unfortunately only with graft remains patent and that has a stent in the anastomosis. Based on the nature of his peripheral/distal vessels, he would probably not be a redo CABG candidate.  AKI (acute kidney injury) Creatinine is a bit on recent check. Continue to hold spironolactone until his labs back. We will then reassess to restarting dose. He should have his labs checked on Monday. Someone to take furosemide once a day until we see that. We'll then probably go back to maybe 80 mg in the morning and 40 mg in the evening.  HLD (hyperlipidemia) Relatively well controlled on Zocor. Probably not due for another recheck until August timeframe.  Obesity, Class II, BMI 35-39.9, with comorbidity I really would like him to get into cardiac rehabilitation program. He was to do this at [facility]. There seems to be some pulled up with him having the LifeVest - which  really makes no sense to me as it is no different than a defibrillator. We talked about the importance of dietary modification. He is trying his best he says to, but unfortunately he's actually gained 4 pounds since last visit. His wife was with him and we discussed a heart healthy type diet. This is another reason for him to get into cardiac rehabilitation program.  Chronic combined systolic and diastolic CHF, NYHA class 2 No active heart failure symptoms besides the fact is decreased exercise tolerance and exertional dyspnea when he actually does more than just walking around the house.    Orders Placed This Encounter  Procedures  . EKG 12-Lead    Order Specific Question:  Where should this test be performed    Answer:  OTHER  . 2D Echocardiogram without contrast    Standing Status: Future     Number of Occurrences:      Standing Expiration Date: 05/27/2014    Scheduling Instructions:     May 2015 in [place] office    Order Specific Question:  Type of Echo    Answer:  Complete    Order Specific Question:  Where should this test be performed    Answer:  [facility]    Order Specific Question:  Reason for exam-Echo    Answer:  Cardiomyopathy-Ischemic  414.8   No orders of the defined types were placed in this encounter.    Followup: May; following echocardiogram -- most [facility] in [facility]  Sula Fetterly W. Herbie BaltimoreHARDING, M.D., M.S. Interventional Cardiologist [facility]

## 2013-05-30 NOTE — Assessment & Plan Note (Signed)
Currently wearing LifeVest -- plan would be to recheck echocardiogram and late April, early May.  He is on aspirin, carvedilol, ramipril, spironolactone. All at reasonable doses. No active heart failure symptoms besides the lack of motivation. I am inclined to think is probably more do to his stomach problems from metformin.

## 2013-05-30 NOTE — Assessment & Plan Note (Signed)
No active heart failure symptoms besides the fact is decreased exercise tolerance and exertional dyspnea when he actually does more than just walking around the house.

## 2013-05-30 NOTE — Assessment & Plan Note (Signed)
Yet again another MI -- his third MI being in January of this year. It is not clear which area was probably more effective than this most recent MI. It was not as sizable as the MI back in November, but more unfortunate based on the vein grafts being occluded.

## 2013-05-30 NOTE — Assessment & Plan Note (Signed)
Relatively well controlled on Zocor. Probably not due for another recheck until August timeframe.

## 2013-05-30 NOTE — Assessment & Plan Note (Signed)
Currently not having any anginal pain. He is on the ischemic cardiopathy regimen noted above. Also on statin plus aspirin / Brilinta. No bleeding issues. No dyspnea related to Brilinta. He is also on a PPI for GI prophylaxis while on DAPT.

## 2013-05-30 NOTE — Assessment & Plan Note (Signed)
I really would like him to get into cardiac rehabilitation program. He was to do this at [facility]. There seems to be some pulled up with him having the LifeVest - which really makes no sense to me as it is no different than a defibrillator. We talked about the importance of dietary modification. He is trying his best he says to, but unfortunately he's actually gained 4 pounds since last visit. His wife was with him and we discussed a heart healthy type diet. This is another reason for him to get into cardiac rehabilitation program.

## 2013-05-30 NOTE — Assessment & Plan Note (Signed)
Creatinine is a bit on recent check. Continue to hold spironolactone until his labs back. We will then reassess to restarting dose. He should have his labs checked on Monday. Someone to take furosemide once a day until we see that. We'll then probably go back to maybe 80 mg in the morning and 40 mg in the evening.

## 2013-05-30 NOTE — Assessment & Plan Note (Signed)
Unfortunately only with graft remains patent and that has a stent in the anastomosis. Based on the nature of his peripheral/distal vessels, he would probably not be a redo CABG candidate.

## 2013-06-01 ENCOUNTER — Ambulatory Visit (INDEPENDENT_AMBULATORY_CARE_PROVIDER_SITE_OTHER): Payer: Medicare Other | Admitting: *Deleted

## 2013-06-01 DIAGNOSIS — I5022 Chronic systolic (congestive) heart failure: Secondary | ICD-10-CM

## 2013-06-02 LAB — BASIC METABOLIC PANEL
BUN / CREAT RATIO: 17 (ref 10–22)
BUN: 20 mg/dL (ref 8–27)
CO2: 23 mmol/L (ref 18–29)
Calcium: 9.2 mg/dL (ref 8.6–10.2)
Chloride: 101 mmol/L (ref 97–108)
Creatinine, Ser: 1.15 mg/dL (ref 0.76–1.27)
GFR, EST AFRICAN AMERICAN: 74 mL/min/{1.73_m2} (ref >59)
GFR, EST NON AFRICAN AMERICAN: 64 mL/min/{1.73_m2} (ref >59)
GLUCOSE: 91 mg/dL (ref 65–99)
Potassium: 4.2 mmol/L (ref 3.5–5.2)
Sodium: 142 mmol/L (ref 134–144)

## 2013-06-03 ENCOUNTER — Telehealth: Payer: Self-pay | Admitting: Pharmacist

## 2013-06-03 DIAGNOSIS — I5022 Chronic systolic (congestive) heart failure: Secondary | ICD-10-CM

## 2013-06-03 NOTE — Telephone Encounter (Signed)
Aldosterone Receptor Antagonist Initiation Clinic Study - Pharmacist Follow-up from SCr bump during monthly visit  Patient was contacted via telephone today in efforts to discuss the following:  Spironolactone Order Date: 04/24/13 Last BMET: 06/01/13, K+ 4.2, SCr 1.15 (from 1.44)     Medication Availability: yes   BMET labs were reviewed with patient    Reinforced benefits vs risks of spironolactone, including its indication, interactions, importance of adherence, and management of missed doses.   Does the patient  feel that his/her medications are working for him/her?  Yes, has been on hold for approximately 7 days now (since 3/17 when instructed to hold d/t SCr bump)   Has the patient been experiencing any side effects to the medications prescribed?  no   Understanding of regimen: good   Understanding of indications: good   Potential for compliance: good  Assessment & Plan:   Patient had BMET drawn on 3/23 d/t significant SCr bump of 0.9 to 1.44 and K+ increase from 3.8 to 4.6 in roughly 3 weeks. As a result, spironolactone was held on 3/17 and labs were rechecked. Potassium now down to 4.2 and SCr back to 1.15. Patient advised to restart spironolactone and to follow Dr. Elissa HeftyHarding's instructions regarding furosemide (80mg  in the AM, and 40mg  in the PM). Patient denies signs of chest tightness or pain besides for local pain at his incision site. Denies dizziness/headaches. Says he "feels better" and that "things are more normal now." Patient also saw Dr. Juel BurrowMasoud yesterday d/t GI intolerance with metformin. It has now been changed to metformin 500mg  once daily. Due to AKI, will order a BMET sooner than one month to keep close follow-up of potassium and renal function. Patient advised to monitor sugars closely.   Next scheduled BMET: Wednesday June 24, 2013  Britt BottomAlvin B. Artelia Larocheung, PharmD Clinical Pharmacist - Resident Phone: [phone] Pager: [phone] 06/03/2013 10:26 AM

## 2013-06-03 NOTE — Telephone Encounter (Signed)
I ATC x 3, line busy. Carron CurieJennifer Castillo, CMA

## 2013-06-04 NOTE — Telephone Encounter (Signed)
LMTCBx2 for Mardella LaymanLindsey to see what, if anything, is needed. Carron CurieJennifer Royal Beirne, CMA

## 2013-06-04 NOTE — Telephone Encounter (Signed)
Order- [facility] [place] autotitrate BIPAP 10-20/ 5-20  X 7 days for pressure recommendation.

## 2013-06-04 NOTE — Telephone Encounter (Signed)
Order was sent to [facility] 

## 2013-06-04 NOTE — Telephone Encounter (Signed)
I spoke with Mardella LaymanLindsey at [facility] and she states that the pt current setting according to the last RX, which was written back in 2010 by another doctor, is 25/20. Mardella LaymanLindsey states that we need to call the [place] office for [facility] with any further recs. Please advise. Carron CurieJennifer Allister Lessley, CMA

## 2013-06-08 ENCOUNTER — Encounter: Payer: Self-pay | Admitting: Internal Medicine

## 2013-06-08 ENCOUNTER — Ambulatory Visit (INDEPENDENT_AMBULATORY_CARE_PROVIDER_SITE_OTHER): Payer: 59 | Admitting: Internal Medicine

## 2013-06-08 VITALS — BP 128/74 | HR 68 | Ht 66.0 in | Wt 224.8 lb

## 2013-06-08 DIAGNOSIS — I2510 Atherosclerotic heart disease of native coronary artery without angina pectoris: Secondary | ICD-10-CM

## 2013-06-08 DIAGNOSIS — G4733 Obstructive sleep apnea (adult) (pediatric): Secondary | ICD-10-CM

## 2013-06-08 NOTE — Patient Instructions (Signed)
We had sent order to [facility] to do an autotitration with your BIPAP 10-20/ 10-20. If they can't do that for some reason, we will have them reduce the pressure to 15/10 and see how that feels.

## 2013-06-08 NOTE — Progress Notes (Signed)
04/27/13- [name] former smoker Referred by Dr. Bryan Lemmaavid Harding for Obstructive Sleep Apnea Complicated by CAD/ MI/ CABG/ CM/  EF 20%/ Defibrillator, DM2, HTN COMPLAINS [id number]:Referred by Dr. Bryan Lemmaavid Harding.  Has not worn CPAP in 3 months due to pressure too high-states pressure is set at 20. Epworth score 6/24 Patient was diagnosed with obstructive sleep apnea based on a sleep study 10 years ago. Had been comfortable until CABG, and since then pressure has seemed to high-unable to wear CPAP. Slept better with CPAP. Bedtime between 11 PM and 1 AM, 30 minutes sleep latency, waking several times before up at 7 AM. Medical problems include hypertension, history of MI, CHF, arrhythmia, DM, prostate cancer.  06/08/13- [name] former smoker Referred by Dr. Bryan Lemmaavid Harding for Obstructive Sleep Apnea Complicated by CAD/ MI/ CABG/ CM/  EF 20%/ Defibrillator, DM2, HTN FOLLOWS FOR: DME is [facility]. Order faxed last week for download. Pt states he is not wearing BiPAP since leaving the hosptial. We had sent order for [facility] to autotitrate BIPAP I   5-20-   E 10-20. We are calling to see why they have not set up his BiPAP He sleeps 3 hours at a time. Complains-PAP pressures hurt his sternum because of previous sternotomy. Still has portable defibrillator  ROS-see HPI Constitutional:   No-   weight loss, night sweats, fevers, chills, +fatigue, lassitude. HEENT:   No-  headaches, difficulty swallowing, +tooth/dental problems, sore throat,       No-  sneezing, itching, ear ache, nasal congestion, post nasal drip,  CV:  + chest pain, orthopnea, PND, +swelling in lower extremities, anasarca,                                                       dizziness, +palpitations Resp: +shortness of breath with exertion or at rest.              No-   productive cough,  No non-productive cough,  No- coughing up of blood.              No-   change in color of mucus.  No- wheezing.   Skin: No-   rash or lesions. GI:   +heartburn,+ indigestion, no-abdominal pain, nausea, vomiting,  GU:  MS:  No-   joint pain or swelling.  Neuro-     nothing unusual Psych:  No- change in mood or affect. No depression or anxiety.  No memory loss.  OBJ- Physical Exam General- Alert, Oriented, Affect-appropriate, Distress- none acute Skin- rash-none, lesions- none, excoriation- none Lymphadenopathy- none Head- atraumatic            Eyes- Gross vision intact, PERRLA, conjunctivae and secretions clear            Ears- Hearing, canals-normal            Nose- Clear, no-Septal dev, mucus, polyps, erosion, perforation             Throat- Mallampati III , mucosa clear , drainage- none, tonsils- atrophic Neck- flexible , trachea midline, no stridor , thyroid nl, carotid no bruit Chest - symmetrical excursion , unlabored           Heart/CV- RRR , no murmur , no gallop  , no rub, nl s1 s2                           -  JVD- none , edema- none, stasis changes- none, varices- none           Lung- clear to P&A, wheeze- none, cough- none , dullness-none, rub- none           Chest wall- + VEST Pacer/ Life Vest Abd-  Br/ Gen/ Rectal- Not done, not indicated Extrem- cyanosis- none, clubbing, none, atrophy- none, strength- nl Neuro- grossly intact to observation

## 2013-06-13 NOTE — Progress Notes (Signed)
  HPI: Patient returns for routine postoperative follow-up having undergone CABG  The patient's early postoperative recovery while in the hospital was notable for renal dysfunction. Since hospital discharge the patient reports weak and sore.   Current Outpatient Prescriptions  Medication Sig Dispense Refill  . aspirin EC 81 MG EC tablet Take 1 tablet (81 mg total) by mouth daily.      Marland Kitchen. glimepiride (AMARYL) 2 MG tablet Take 1 tablet (2 mg total) by mouth daily with breakfast.  30 tablet  3  . simvastatin (ZOCOR) 40 MG tablet Take 40 mg by mouth daily.      . carvedilol (COREG) 12.5 MG tablet Take 0.5 tablets (6.25 mg total) by mouth 2 (two) times daily with a meal.  60 tablet  6  . furosemide (LASIX) 80 MG tablet Take 80 mg by mouth daily.      . metFORMIN (GLUCOPHAGE) 500 MG tablet Take 500 mg by mouth daily with breakfast.       . nitroGLYCERIN (NITROSTAT) 0.4 MG SL tablet Place 1 tablet (0.4 mg total) under the tongue every 5 (five) minutes as needed for chest pain.  25 tablet  4  . pantoprazole (PROTONIX) 40 MG tablet Take 40 mg by mouth 2 (two) times daily.      . potassium chloride (K-DUR) 10 MEQ tablet Take 1 tablet (10 mEq total) by mouth 2 (two) times daily.  60 tablet  6  . ramipril (ALTACE) 10 MG capsule Take 1 capsule (10 mg total) by mouth daily.  90 capsule  3  . spironolactone (ALDACTONE) 12.5 mg TABS tablet Take 0.5 tablets (12.5 mg total) by mouth 2 (two) times daily.  30 tablet  6  . Ticagrelor (BRILINTA) 90 MG TABS tablet Take 1 tablet (90 mg total) by mouth 2 (two) times daily.  60 tablet  11   No current facility-administered medications for this visit.    Physical Exam: Incisions well healed Reduced bs at L base  Diagnostic Tests: CXR reviewed- see report  Impression: Stable postop CABG  Plan: Return for followup in a month Activity restrictions d/w patient

## 2013-06-24 ENCOUNTER — Other Ambulatory Visit: Payer: 59

## 2013-07-02 ENCOUNTER — Ambulatory Visit (INDEPENDENT_AMBULATORY_CARE_PROVIDER_SITE_OTHER): Payer: 59 | Admitting: *Deleted

## 2013-07-02 DIAGNOSIS — I509 Heart failure, unspecified: Secondary | ICD-10-CM

## 2013-07-03 LAB — BASIC METABOLIC PANEL
BUN / CREAT RATIO: 21 (ref 10–22)
BUN: 25 mg/dL (ref 8–27)
CO2: 21 mmol/L (ref 18–29)
Calcium: 9.4 mg/dL (ref 8.6–10.2)
Chloride: 96 mmol/L — ABNORMAL LOW (ref 97–108)
Creatinine, Ser: 1.21 mg/dL (ref 0.76–1.27)
GFR, EST AFRICAN AMERICAN: 69 mL/min/{1.73_m2} (ref >59)
GFR, EST NON AFRICAN AMERICAN: 60 mL/min/{1.73_m2} (ref >59)
Glucose: 151 mg/dL — ABNORMAL HIGH (ref 65–99)
Potassium: 4.8 mmol/L (ref 3.5–5.2)
Sodium: 139 mmol/L (ref 134–144)

## 2013-07-05 ENCOUNTER — Encounter: Payer: Self-pay | Admitting: Internal Medicine

## 2013-07-05 NOTE — Assessment & Plan Note (Signed)
PAP pressure hurts at sternotomy incision scar. Plan-if we can't autotitrate his BiPAP then we will reduce inspiratory pressure to15 and expiratory pressure to 10

## 2013-07-06 ENCOUNTER — Telehealth: Payer: Self-pay | Admitting: Pharmacist

## 2013-07-07 NOTE — Telephone Encounter (Signed)
Faxed prescription renewal for lifevest .-signed 07/01/13 Patient is schedule to have  echo and follow up with d\Dr Herbie BaltimoreHarding in 08/2013 at the [facility]

## 2013-07-15 ENCOUNTER — Telehealth: Payer: Self-pay | Admitting: Cardiology

## 2013-07-15 NOTE — Telephone Encounter (Signed)
Agree with recommendations.  Marykay Lexavid W Harding, MD

## 2013-07-15 NOTE — Telephone Encounter (Signed)
Please call-having chest pain right under his incision.

## 2013-07-15 NOTE — Telephone Encounter (Signed)
Returned call and pt verified x 2.  Pt stated he was having CP a few minutes ago.  Stated he called [facility] and they said they just saw a small increase in his HR.  Pt stated he has some pain pills and NTG.  Wanted to know if he should take either.  Pt advised to take NTG for CP as needed.  Pt also c/o relief after belching and informed symptoms could be r/t to indigestion.  Pt stated he has noticed that his food takes longer to digest.    Advice  Avoid spicy, greasy foods  Eat 6 small meals/day instead of regular breakfast, lunch, dinner  Take NTG prn CP/pressure/tightness (call 911 if take 3 in 15 mins  Take pain med prn  Keep app[facility]/ [facility]  Pt verbalized understanding and agreed w/ plan.  Pt stated he took NTG while on the phone and feels the pain is fading away.  Pt advised to continue to monitor and call back if needed.  Pt verbalized understanding and agreed w/ plan.

## 2013-07-29 ENCOUNTER — Telehealth: Payer: Self-pay | Admitting: Pharmacist

## 2013-07-29 DIAGNOSIS — I5022 Chronic systolic (congestive) heart failure: Secondary | ICD-10-CM

## 2013-07-29 NOTE — Telephone Encounter (Signed)
Aldosterone Receptor Antagonist Initiation Clinic Study - Pharmacist Second Monthly Follow-Up Patient was contacted via telephone today in efforts to discuss the following:  Spironolactone Order Date: 04/24/13 Last BMET: 07/02/13, K+ 4.8, SCr 1.2     Medication Availability: yes   BMET labs were reviewed with patient    Reinforced benefits vs risks of spironolactone, including its indication, interactions, importance of adherence, and management of missed doses.   Does the patient  feel that his/her medications are working for him/her?  yes   Has the patient been experiencing any side effects to the medications prescribed?  no   Understanding of regimen: good   Understanding of indications: good   Potential for compliance: good  Assessment & Plan:   Spoke to [name] today after multiple call attempts. Labs are wnl. Patient does not have any complaints concerning medications but says he still does have some residual chest pain from his stitches. He also says he has recently started taking ranitidine 150mg  once daily for the "acid in his stomach," which seems to have helped. I have encouraged patient to make lifestyle modifications to limit the amount of medications he has to take in the future. This includes avoiding spicy, greasy foods and refraining from supine position after meals. Patient is scheduled for a f/u ECHO on June 9. I will schedule BMET to go along with it.   Next scheduled BMET: August 18, 2013  Britt BottomAlvin B. Artelia Larocheung, PharmD Clinical Pharmacist - Resident Phone: [phone] Pager: [phone] 07/29/2013 10:56 AM

## 2013-08-10 ENCOUNTER — Encounter: Payer: Self-pay | Admitting: Internal Medicine

## 2013-08-10 ENCOUNTER — Ambulatory Visit (INDEPENDENT_AMBULATORY_CARE_PROVIDER_SITE_OTHER): Payer: Medicare Other | Admitting: Internal Medicine

## 2013-08-10 VITALS — BP 126/72 | HR 81 | Ht 67.0 in | Wt 229.0 lb

## 2013-08-10 DIAGNOSIS — I509 Heart failure, unspecified: Secondary | ICD-10-CM

## 2013-08-10 DIAGNOSIS — G4733 Obstructive sleep apnea (adult) (pediatric): Secondary | ICD-10-CM

## 2013-08-10 DIAGNOSIS — I5042 Chronic combined systolic (congestive) and diastolic (congestive) heart failure: Secondary | ICD-10-CM

## 2013-08-10 DIAGNOSIS — I2510 Atherosclerotic heart disease of native coronary artery without angina pectoris: Secondary | ICD-10-CM

## 2013-08-10 NOTE — Patient Instructions (Signed)
Order- DME [facility] [place]   Need pressure/ compliance download from his BIPAP machine     Dx OSA  You can talk with [facility] about when you can replace your old machine. If they say you qualify, then we can send them the order for it.

## 2013-08-10 NOTE — Progress Notes (Signed)
04/27/13- 73 yoM former smoker Referred by Dr. Bryan Lemma for Obstructive Sleep Apnea Complicated by CAD/ MI/ CABG/ CM/  EF 20%/ Defibrillator, DM2, HTN COMPLAINS [id number] by Dr. Bryan Lemma.  Has not worn CPAP in 3 months due to pressure too high-states pressure is set at 20. Epworth score 6/24 Patient was diagnosed with obstructive sleep apnea based on a sleep study 10 years ago. Had been comfortable until CABG, and since then pressure has seemed to high-unable to wear CPAP. Slept better with CPAP. Bedtime between 11 PM and 1 AM, 30 minutes sleep latency, waking several times before up at 7 AM. Medical problems include hypertension, history of MI, CHF, arrhythmia, DM, prostate cancer.  06/08/13- 73 yoM former smoker Referred by Dr. Bryan Lemma for Obstructive Sleep Apnea Complicated by CAD/ MI/ CABG/ CM/  EF 20%/ Defibrillator, DM2, HTN FOLLOWS FOR: DME is [facility] [place]. Order faxed last week for download. Pt states he is not wearing BiPAP since leaving the hosptial. We had sent order for [facility] to autotitrate BIPAP I   5-20-   E 10-20. We are calling to see why they have not set up his BiPAP He sleeps 3 hours at a time. Complains-PAP pressures hurt his sternum because of previous sternotomy. Still has portable defibrillator  08/10/13- 73 yoM former smoker Referred by Dr. Bryan Lemma for Obstructive Sleep Apnea Complicated by CAD/ MI/ CABG/ CM/  EF 20%/ Defibrillator, DM2, HTN FOLLOWS FOR:  OSA/CPAP check.  wearing BIPAP auto 5-20/ 5-20 every night Machine is 72 years old. Still gets some soreness at his healed sternal incision site from the pressure. Says BiPAP makes him too gassy. Has been feeling tireder.  ROS-see HPI Constitutional:   No-   weight loss, night sweats, fevers, chills, +fatigue, lassitude. HEENT:   No-  headaches, difficulty swallowing, +tooth/dental problems, sore throat,       No-  sneezing, itching, ear ache, nasal congestion, post nasal drip,  CV:  +  chest pain, orthopnea, PND, +swelling in lower extremities, anasarca,                                                       dizziness, +palpitations Resp: +shortness of breath with exertion or at rest.              No-   productive cough,  No non-productive cough,  No- coughing up of blood.              No-   change in color of mucus.  No- wheezing.   Skin: No-   rash or lesions. GI:  +heartburn,+ indigestion, no-abdominal pain, nausea, vomiting,  GU:  MS:  No-   joint pain or swelling.  Neuro-     nothing unusual Psych:  No- change in mood or affect. No depression or anxiety.  No memory loss.  OBJ- Physical Exam General- Alert, Oriented, Affect-appropriate, Distress- none acute Skin- rash-none, lesions- none, excoriation- none Lymphadenopathy- none Head- atraumatic            Eyes- Gross vision intact, PERRLA, conjunctivae and secretions clear            Ears- Hearing, canals-normal            Nose- Clear, no-Septal dev, mucus, polyps, erosion, perforation  Throat- Mallampati III , mucosa clear , drainage- none, tonsils- atrophic Neck- flexible , trachea midline, no stridor , thyroid nl, carotid no bruit Chest - symmetrical excursion , unlabored           Heart/CV- RRR , no murmur , no gallop  , no rub, nl s1 s2                           - JVD- none , edema- none, stasis changes- none, varices- none           Lung- clear to P&A, wheeze- none, cough- none , dullness-none, rub- none           Chest wall- + VEST Pacer/ Life Vest Abd-  Br/ Gen/ Rectal- Not done, not indicated Extrem- cyanosis- none, clubbing, none, atrophy- none, strength- nl Neuro- grossly intact to observation

## 2013-08-18 ENCOUNTER — Ambulatory Visit (INDEPENDENT_AMBULATORY_CARE_PROVIDER_SITE_OTHER): Payer: 59 | Admitting: *Deleted

## 2013-08-18 ENCOUNTER — Other Ambulatory Visit (INDEPENDENT_AMBULATORY_CARE_PROVIDER_SITE_OTHER): Payer: 59

## 2013-08-18 ENCOUNTER — Other Ambulatory Visit: Payer: Self-pay

## 2013-08-18 DIAGNOSIS — R0602 Shortness of breath: Secondary | ICD-10-CM

## 2013-08-18 DIAGNOSIS — Z951 Presence of aortocoronary bypass graft: Secondary | ICD-10-CM

## 2013-08-18 DIAGNOSIS — I509 Heart failure, unspecified: Secondary | ICD-10-CM

## 2013-08-18 DIAGNOSIS — I214 Non-ST elevation (NSTEMI) myocardial infarction: Secondary | ICD-10-CM

## 2013-08-18 DIAGNOSIS — I5022 Chronic systolic (congestive) heart failure: Secondary | ICD-10-CM

## 2013-08-18 DIAGNOSIS — Z9189 Other specified personal risk factors, not elsewhere classified: Secondary | ICD-10-CM

## 2013-08-18 DIAGNOSIS — I255 Ischemic cardiomyopathy: Secondary | ICD-10-CM

## 2013-08-18 DIAGNOSIS — I2510 Atherosclerotic heart disease of native coronary artery without angina pectoris: Secondary | ICD-10-CM

## 2013-08-18 DIAGNOSIS — Z9861 Coronary angioplasty status: Secondary | ICD-10-CM

## 2013-08-18 HISTORY — PX: TRANSTHORACIC ECHOCARDIOGRAM: SHX275

## 2013-08-19 ENCOUNTER — Ambulatory Visit (INDEPENDENT_AMBULATORY_CARE_PROVIDER_SITE_OTHER): Payer: 59 | Admitting: Cardiology

## 2013-08-19 ENCOUNTER — Encounter: Payer: Self-pay | Admitting: Cardiology

## 2013-08-19 VITALS — BP 134/78 | HR 76 | Ht 66.0 in | Wt 226.0 lb

## 2013-08-19 DIAGNOSIS — E785 Hyperlipidemia, unspecified: Secondary | ICD-10-CM

## 2013-08-19 DIAGNOSIS — E669 Obesity, unspecified: Secondary | ICD-10-CM

## 2013-08-19 DIAGNOSIS — I255 Ischemic cardiomyopathy: Secondary | ICD-10-CM

## 2013-08-19 DIAGNOSIS — R079 Chest pain, unspecified: Secondary | ICD-10-CM

## 2013-08-19 DIAGNOSIS — I5042 Chronic combined systolic (congestive) and diastolic (congestive) heart failure: Secondary | ICD-10-CM

## 2013-08-19 DIAGNOSIS — I10 Essential (primary) hypertension: Secondary | ICD-10-CM

## 2013-08-19 DIAGNOSIS — E119 Type 2 diabetes mellitus without complications: Secondary | ICD-10-CM

## 2013-08-19 DIAGNOSIS — I509 Heart failure, unspecified: Secondary | ICD-10-CM

## 2013-08-19 DIAGNOSIS — I2510 Atherosclerotic heart disease of native coronary artery without angina pectoris: Secondary | ICD-10-CM

## 2013-08-19 DIAGNOSIS — I2589 Other forms of chronic ischemic heart disease: Secondary | ICD-10-CM

## 2013-08-19 DIAGNOSIS — G4733 Obstructive sleep apnea (adult) (pediatric): Secondary | ICD-10-CM

## 2013-08-19 DIAGNOSIS — IMO0001 Reserved for inherently not codable concepts without codable children: Secondary | ICD-10-CM

## 2013-08-19 LAB — BASIC METABOLIC PANEL
BUN/Creatinine Ratio: 17 (ref 10–22)
BUN: 23 mg/dL (ref 8–27)
CO2: 19 mmol/L (ref 18–29)
CREATININE: 1.32 mg/dL — AB (ref 0.76–1.27)
Calcium: 9.2 mg/dL (ref 8.6–10.2)
Chloride: 99 mmol/L (ref 97–108)
GFR, EST AFRICAN AMERICAN: 62 mL/min/{1.73_m2} (ref >59)
GFR, EST NON AFRICAN AMERICAN: 54 mL/min/{1.73_m2} — AB (ref >59)
GLUCOSE: 169 mg/dL — AB (ref 65–99)
Potassium: 4.1 mmol/L (ref 3.5–5.2)
Sodium: 138 mmol/L (ref 134–144)

## 2013-08-19 MED ORDER — CARVEDILOL 25 MG PO TABS: ORAL_TABLET | ORAL | Status: DC

## 2013-08-19 MED ORDER — CARVEDILOL 25 MG PO TABS: ORAL_TABLET | ORAL | Status: AC

## 2013-08-19 NOTE — Patient Instructions (Signed)
Your physician has recommended you make the following change in your medication:  Increase Coreg to 12.5 mg (half a tablet) in the morning and 25 mg (whole tablet) at night for two weeks. Then increase to 25 mg twice daily if you are able.    Your physician recommends that you schedule a follow-up appointment in:  3 months   Please remove your [employer] life vest and return in the box given.

## 2013-08-21 ENCOUNTER — Encounter: Payer: Self-pay | Admitting: Cardiology

## 2013-08-21 ENCOUNTER — Other Ambulatory Visit: Payer: 59

## 2013-08-21 NOTE — Assessment & Plan Note (Signed)
Minimal CHF Symptoms for now - just decreased exercise tolerance -- never got "back to baseline" from pre November MI. On ACE-I & BB, Lasix & Spironolactone.  Plan: Increase Coreg to 12.5 mg bid (slow taper); Sliding scale Lasix.

## 2013-08-21 NOTE — Assessment & Plan Note (Signed)
Relatively well controlled - room to increase BB dose. Next - increase ACE-I

## 2013-08-21 NOTE — Assessment & Plan Note (Signed)
Still a bit sore.  Working on settings with Dr. Maple HudsonYoung.

## 2013-08-21 NOTE — Assessment & Plan Note (Addendum)
On simvastatin.  Due for Re-check prior to next f/u. Near goal in Feb 2015.

## 2013-08-21 NOTE — Progress Notes (Signed)
PCP: Corky Downs, MD  Clinic Note: Chief Complaint  Patient presents with  . other    3 month follow up as well as discuss Echo. Pt. c/o chest pain at times.  Meds reviewed by the patient verbally.    HPI: Earl Williamson is a 73 y.o. male with a Cardiovascular Problem List below who presents today for 3 month follow-up s/p High Risk PCI to LIMA-LAD insertion & RCA following a NSTEMI (SVG-RCA & SVG-Cx occluded, LIMA -LAD disease).   By LV Gram, his EF was as low as ~25%,but by echo was 30-35% immediately post PCI.  Since then, he has been wearing a LifeVest.   He was planning on going to [employer] (his nutritionist), but was trying to make sure that his insurance covered the cost.  He has only been taking 12.5 mg Coreg @ night & not 6.25 mg bid.  Interval History: He says that he is doing much better now.  He does have occasional check discomfort, but it is more MSK related with sternotomy discomfort.  Only on rare occasions has he feels chest pain that is anginal & takes a NTG with some relief.  He is less dyspneic on exertion & has less overall edema -- rarely uses extra Lasix.  No PND or orthopnea, but is having issues with his CPAP settings.  He has been trying hard to adjust the his diet, but thinks he may be eating too much. He goes for almost daily AM walks. No palpitations, lightheadedness, dizziness, weakness or syncope/near syncope. No TIA/amaurosis fugax symptoms. No melena, hematochezia, hematuria, or epstaxis. No claudication.  Past Medical History  Diagnosis Date  . ST elevation myocardial infarction (STEMI) of inferoposterior wall, subsequent episode of care 2005    [facility]) - 100% RCA -- DES; also mCx & LAD lesions  . CAD S/P percutaneous coronary angioplasty 2005    [facility] -- DES to pRCA (culprit for STEMI) - staged DES to mCx & mLAD  . Non-ST elevation myocardial infarction (NSTEMI), subsequent episode of care 01/2013    MultiVessel CAD --> CABG  . CAD in native artery  01/2013    Cath: 50% LAD ISR with 95% post-stent; prox OM1 95%; pRCA stent ~70% ISR, mRCA 90% & 60%, rPDA 99% --> CABG  . 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 Maren Beach -- LIMA-LAD, SVG-OM 1, SVG-RPDA  . NSTEMI (non-ST elevated myocardial infarction) 04/12/2013  . CAD (coronary artery disease) of bypass graft 04/13/2013    SVG-OM & SVG-RCA occluded; 99% anastomotic lesion in LIMA-LAD  . S/P primary angioplasty with coronary stent 04/14/2013    Complex Impella protected ROTAblator PCI to m & dRCA-PDA, PCI to LIMA-LAD  . Ischemic dilated cardiomyopathy 04/17/2013; 08/2013    EF by LV gram to 20%; by echocardiogram 30 and 35%;; F/u Echo 08/18/2013 -- EF 35-40%  . At risk for sudden cardiac death - EF now ~25% 12-Feb-2013  . TIA 04/15/2013    MRI of brain suggested "incidental finding of several small microembolic CVA  . Diabetes mellitus type 2 in obese     Oral medications  . Obesity, Class II, BMI 35-39.9, with comorbidity   . OSA on CPAP     needs CPAP titration  . Hypertension, essential   . Hyperlipidemia LDL goal < 70     Prior Cardiac Evaluation and Past Surgical History: Past Surgical History  Procedure Laterality Date  . Joint replacement    . Coronary stent placement  2005  DES to proximal RCA, mid circumflex and mid LAD in the setting of STEMI  . Coronary artery bypass graft N/A 01/30/2013    Procedure: CORONARY ARTERY BYPASS GRAFTING (CABG);  Surgeon: Kerin PernaPeter Van Trigt, MD;  [facility];  Service: Open Heart Surgery;  Laterality: N/A;  . Intraoperative transesophageal echocardiogram N/A 01/30/2013    Procedure: INTRAOPERATIVE TRANSESOPHAGEAL ECHOCARDIOGRAM;  Surgeon: Kerin PernaPeter Van Trigt, MD;  [facility];  Service: Open Heart Surgery;  Laterality: N/A;  . Transthoracic echocardiogram  01/27/2013    EF 30-35%. Mildly dilated LV with mild concentric LVH.  Apical dyskinesis with severe HK of mid-distal anterolateral/septal and inferoseptal myocardium; grade 2 diastolic function  . Coronary  angioplasty with stent placement  04/15/2013    Impella supported PCI to LIMA-LAD anastomosis, proximal-mid RCA Rotablator guided PCI of the distal and proximal RCA and PDA PTCA  . Cardiac catheterization  04/13/13  . Transthoracic echocardiogram  04/16/2013    EF 30-35%. Severe concentric LVH. Moderately severe to function. Grade 1 diastolic function. Mildly dilated RV and RA.  . Transthoracic echocardiogram  08/18/2013    EF 35-40%.  Severe Conc LVH; Gr 1 DD    MEDICATIONS AND ALLERGIES REVIEWED IN EPIC No Change in Social and Family History  ROS: A comprehensive Review of Systems - Negative except Symptoms are in history of present illness. No more GI symptoms. Mild myalgias and arthralgias. Lack of desire to do activities.  PHYSICAL EXAM BP 134/78  Pulse 76  Ht 5\' 6"  (1.676 m)  Wt 226 lb (102.513 kg)  BMI 36.49 kg/m2 Filed Weights   08/19/13 1523  Weight: 226 lb (102.513 kg)   General appearance: alert, cooperative, appears stated age, no distress and moderately obese Neck: no adenopathy, no carotid bruit and no JVD Lungs: clear to auscultation bilaterally, normal percussion bilaterally and non-labored Heart: regular rate and rhythm, S1, S2 normal, no murmur, click, rub or gallop; nondisplaced PMI Abdomen: soft, non-tender; bowel sounds normal; no masses,  no organomegaly; moderate obesity Extremities: extremities normal, atraumatic, no cyanosis, and edema --trace Pulses: 2+ and symmetric;  Neurologic: Mental status: Alert, oriented, thought content appropriate; Cranial nerves: normal (II-XII grossly intact)   Adult ECG Report  Rate: 73 ;  Rhythm: normal sinus rhythm and premature ventricular contractions (PVC)   Narrative Interpretation: Septal infarct age undetermined, inferior infarct age undetermined. -- No change  Recent Labs: 08/18/2013: K 4.1, BUN/Cr 23/1.32  ASSESSMENT / PLAN:  Cardiomyopathy, ischemic, ~ 20% by LV gram February 2015 - Improved to 35-40%  (08/2013) Great to see improved LVEF. He can turn in the LifeVest for now as he is just at the cutoff.  Will discuss +/- ICD recommendations with EP.   Chronic combined systolic and diastolic CHF, NYHA class 2 Minimal CHF Symptoms for now - just decreased exercise tolerance -- never got "back to baseline" from pre November MI. On ACE-I & BB, Lasix & Spironolactone.  Plan: Increase Coreg to 12.5 mg bid (slow taper); Sliding scale Lasix.  HTN (hypertension), benign Relatively well controlled - room to increase BB dose. Next - increase ACE-I  DM (diabetes mellitus), type 2 with complications - CAD On PO meds - followed by PCP.  HLD (hyperlipidemia) On simvastatin.  Due for Re-check prior to next f/u. Near goal in Feb 2015.  Obesity, Class II, BMI 35-39.9, with comorbidity Still not sure what happened to Cardiac Rehab -- he is walking daily & adjusted his diet.  He needs to lower portion size & continue to  exercise -- getting HR up.  Return to Nutritionist - [employer] for counseling.  Obstructive sleep apnea Still a bit sore.  Working on settings with Dr. Maple HudsonYoung.     Orders Placed This Encounter  Procedures  . EKG 12-Lead   Meds ordered this encounter  Medications  . DISCONTD: carvedilol (COREG) 25 MG tablet    Sig: Take 12.5 mg (half a tablet) in the morning. Take 25 mg (whole tablet) at night for two weeks. Then increase to 25 mg twice daily if you are able.    Dispense:  180 tablet    Refill:  3  . carvedilol (COREG) 25 MG tablet    Sig: Take 12.5 mg (half a tablet) in the morning and take 25 mg (whole tablet) at night for two weeks. Then increase to 25 mg twice daily if you are able.    Dispense:  180 tablet    Refill:  3    Followup: 3 months [facility]  DAVID W. Herbie BaltimoreHARDING, M.D., M.S. Interventional Cardiologist [facility]

## 2013-08-21 NOTE — Assessment & Plan Note (Signed)
Still not sure what happened to Cardiac Rehab -- he is walking daily & adjusted his diet.  He needs to lower portion size & continue to exercise -- getting HR up.  Return to [facility]ist - [employer] for counseling.

## 2013-08-21 NOTE — Assessment & Plan Note (Addendum)
Great to see improved LVEF. He can turn in the LifeVest for now as he is just at the cutoff.  Will discuss +/- ICD recommendations with EP.

## 2013-08-21 NOTE — Assessment & Plan Note (Signed)
On PO meds - followed by PCP.

## 2013-08-25 ENCOUNTER — Telehealth: Payer: Self-pay | Admitting: *Deleted

## 2013-08-25 DIAGNOSIS — I5042 Chronic combined systolic (congestive) and diastolic (congestive) heart failure: Secondary | ICD-10-CM

## 2013-08-25 DIAGNOSIS — E785 Hyperlipidemia, unspecified: Secondary | ICD-10-CM

## 2013-08-25 NOTE — Telephone Encounter (Signed)
Call patient to set up Lipid profile and CMP to be done in August  Patient verbalized understanding

## 2013-08-26 ENCOUNTER — Ambulatory Visit: Payer: 59 | Admitting: Cardiology

## 2013-08-27 ENCOUNTER — Encounter: Payer: Self-pay | Admitting: *Deleted

## 2013-08-28 ENCOUNTER — Other Ambulatory Visit: Payer: Self-pay

## 2013-08-28 ENCOUNTER — Other Ambulatory Visit: Payer: 59

## 2013-09-09 DEATH — deceased

## 2013-10-12 ENCOUNTER — Other Ambulatory Visit: Payer: Medicare Other

## 2013-10-13 NOTE — Assessment & Plan Note (Signed)
Managed by cardiology 

## 2013-10-13 NOTE — Assessment & Plan Note (Signed)
He needs to work actively with his DME company to keep his device and mask comfortable. His machine is old and he will talk with them about replacement

## 2013-11-25 ENCOUNTER — Ambulatory Visit: Payer: Medicare Other | Admitting: Cardiology

## 2014-02-09 ENCOUNTER — Ambulatory Visit: Payer: Medicare Other | Admitting: Internal Medicine

## 2014-02-18 ENCOUNTER — Encounter (HOSPITAL_COMMUNITY): Payer: Self-pay | Admitting: Cardiovascular Disease

## 2016-01-04 IMAGING — CT CT CHEST W/O CM
2 of 5 series · 15 of 36 positions shown, 18 images · non-contrast
Comparison: None.

CLINICAL DATA: Abdominal pain.  Recent median sternotomy for CABG.

EXAM:
CT CHEST, ABDOMEN AND PELVIS WITHOUT CONTRAST
TECHNIQUE: Multidetector CT imaging of the chest, abdomen and pelvis was
performed following the standard protocol without IV contrast.

[Series 2: cap without · axial · non-contrast · 0.98mm/px · z∈[+107,+702]mm · 12 of 133 slices shown, 15 images]
[im 7/133  mediastinal]
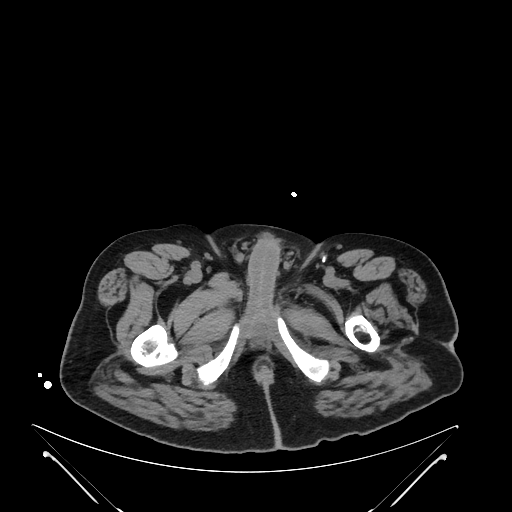
[im 7/133  lung]
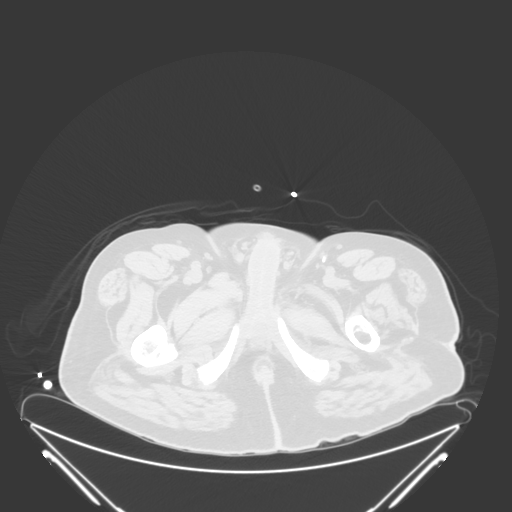
[im 21/133  lung]
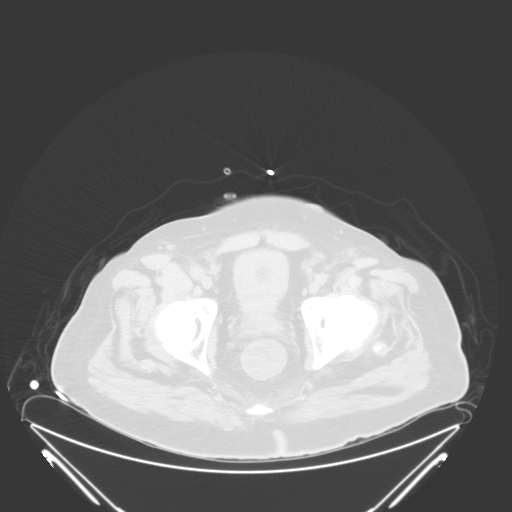
[im 28/133  lung]
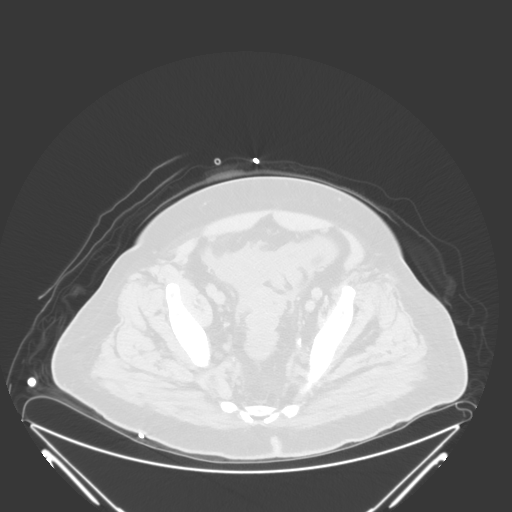
[im 42/133  lung]
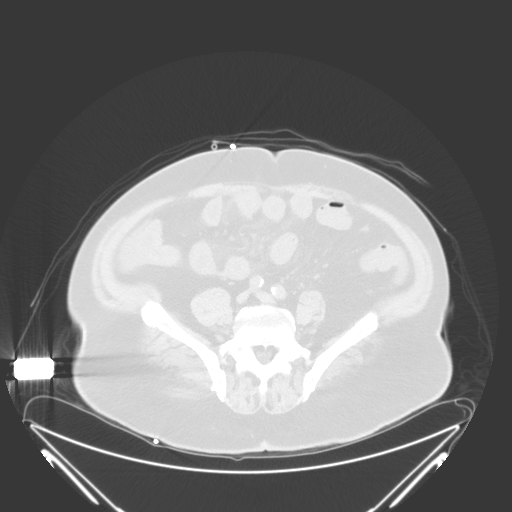
[im 49/133  mediastinal]
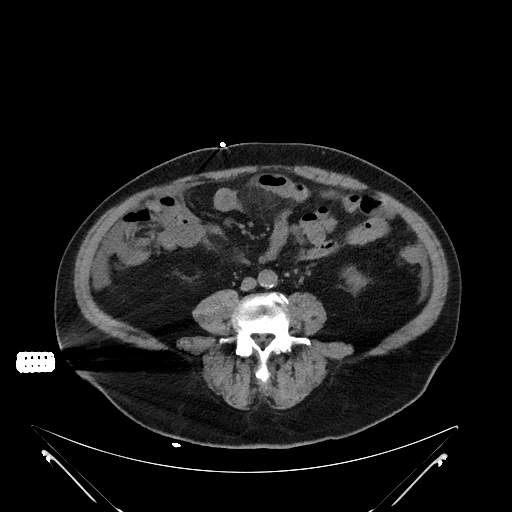
[im 49/133  lung]
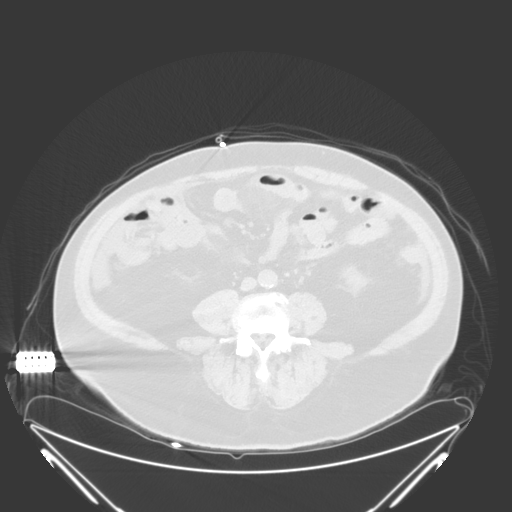
[im 63/133  lung]
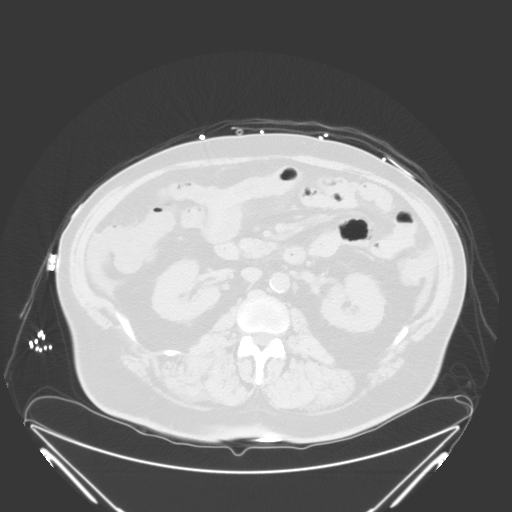
[im 70/133  lung]
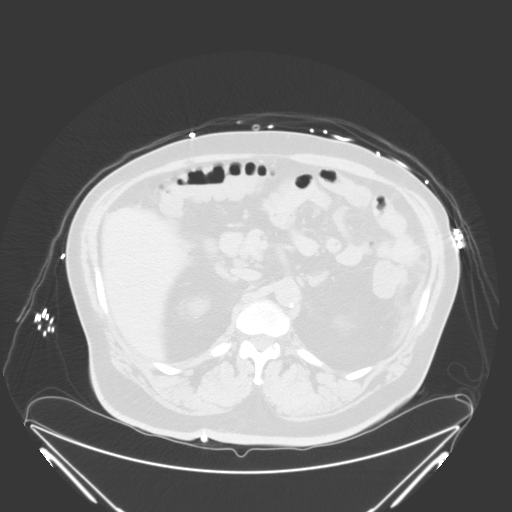
[im 84/133  lung]
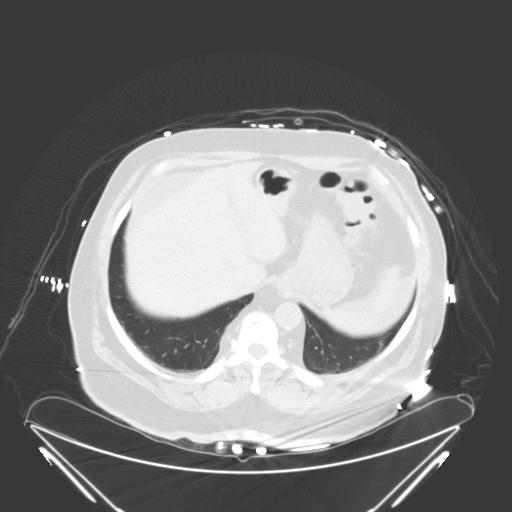
[im 91/133  mediastinal]
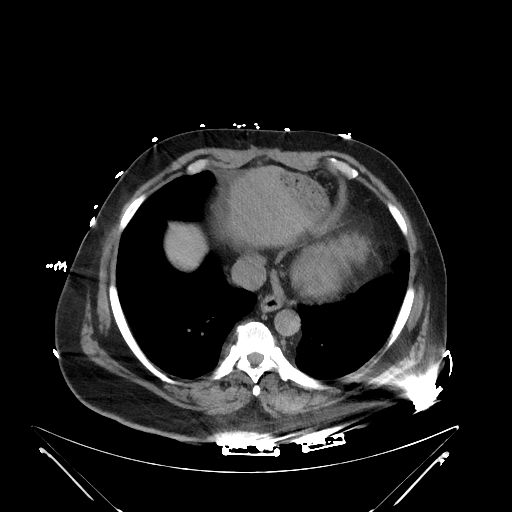
[im 91/133  lung]
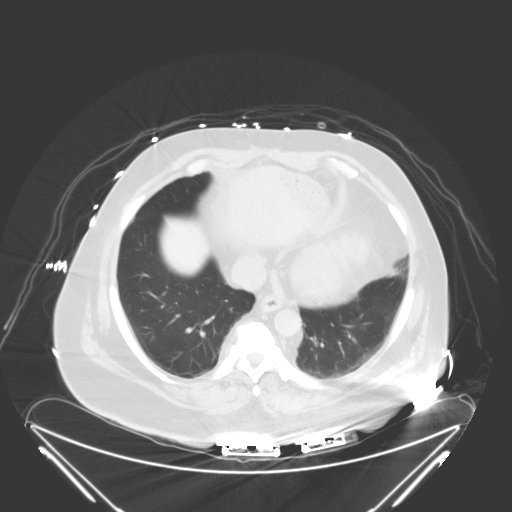
[im 105/133  lung]
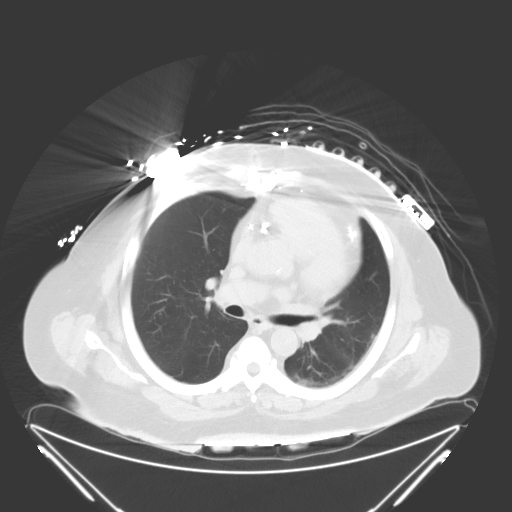
[im 112/133  lung]
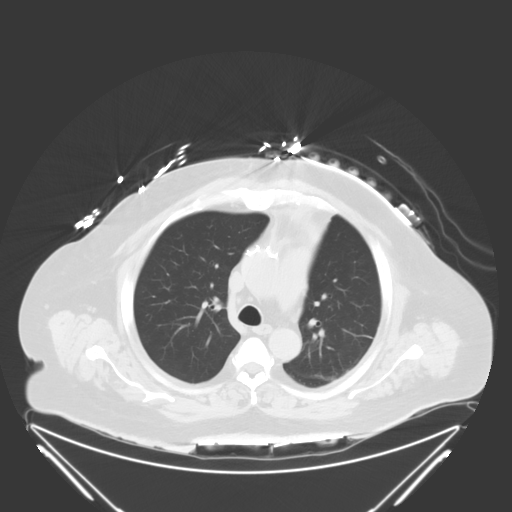
[im 126/133  lung]
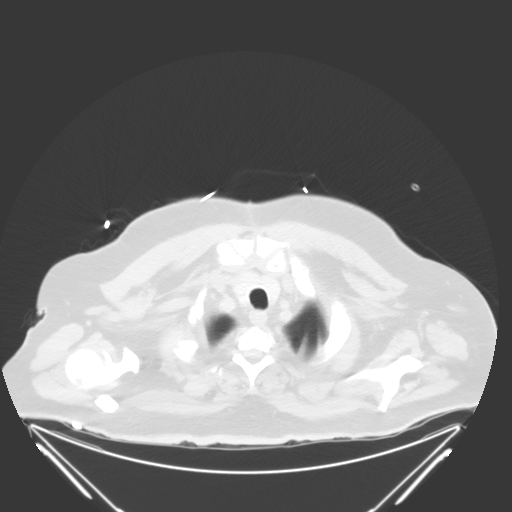

[coronals · coronal · 1.29mm/px · 3 of 106 slices shown]
[im 22/106  lung]
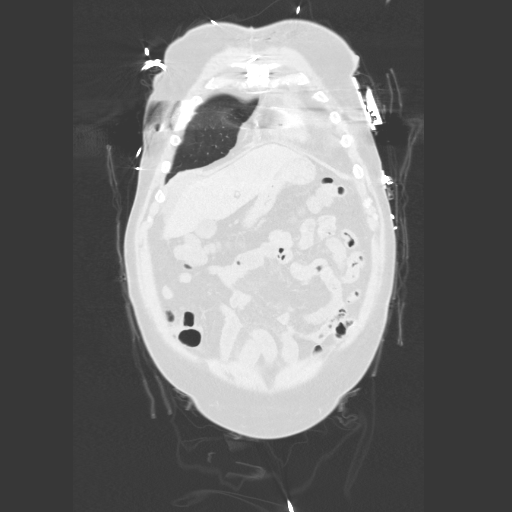
[im 43/106  lung]
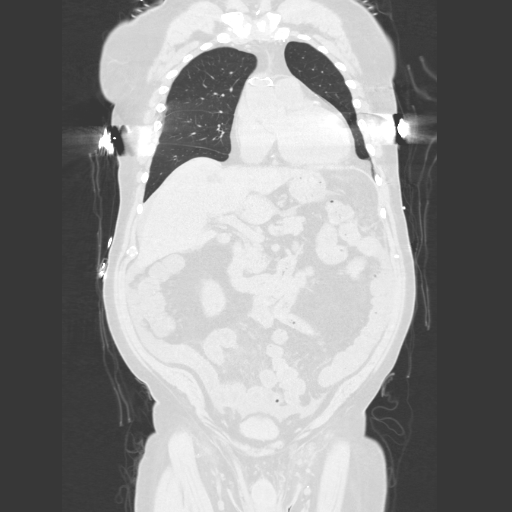
[im 64/106  lung]
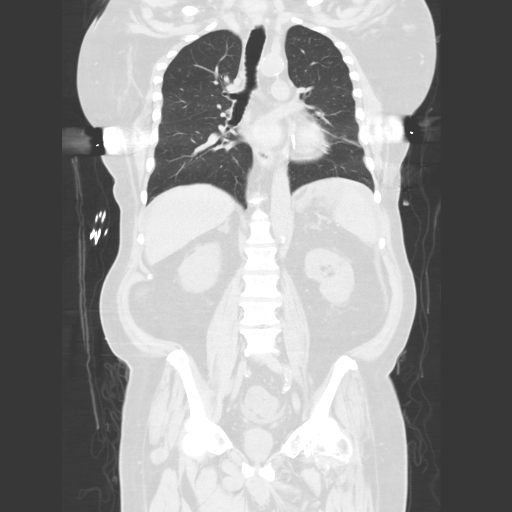

[15 of 36 positions shown; findings below may reference images not displayed]

FINDINGS: CT CHEST FINDINGS

Dependent atelectasis is present in the lungs. Lingular scarring.
Postoperative changes of CABG are present. Residual stranding is
present in the anterior mediastinum. Sternal wires remain intact and
the median sternotomy defect appears within normal limits for this
timeframe after operation. Mild cardiomegaly is present. There is no
axillary adenopathy. No mediastinal or hilar adenopathy. No
aggressive osseous lesions in the chest.

CT ABDOMEN AND PELVIS FINDINGS

Liver: Tiny low-density lesion in the inferior right hepatic lobe
probably represents a cyst. Larger lesion in the right hepatic dome
measures 18 mm (image 47 series 2), also probably a cyst.

Small amount of ascites is present around the liver and spleen.

Spleen:  Normal.

Gallbladder:  Biliary sludge with high density material in the neck.

Common bile duct:  Normal.

Pancreas:  Normal.

Adrenal glands: Thickening bilaterally, and most compatible with
hyperplasia.

Kidneys: Vascular calcifications. No obstruction/hydronephrosis.
Both ureters appear normal.

Stomach:  Collapsed.  Grossly normal.

Small bowel: Enteritis is present in the anatomic pelvis. There is
no bowel obstruction. Stranding surrounds loops of small bowel and a
small amount of ascites is present around pelvic small bowel as
well. Stranding extends into the mesentery. There is no pneumatosis.

Colon: Fluid is present within the colon, abnormal but nonspecific.
No colonic mural thickening is identified.

Pelvic Genitourinary: Collapsed urinary bladder. Prostate
calcifications. Fluid in the anatomic pelvis is low to intermediate
attenuation and probably represents minimally complex ascites. No
pelvic abscess.

Bones: No aggressive osseous lesions. Severe left hip
osteoarthritis, which appears secondary to either AVN or
developmental hip dysplasia. Heterotopic bone adjacent to the left
hip. Dysplastic acetabulum with lateral migration of the femoral
head.

Vasculature: Atherosclerosis without an acute vascular abnormality.

Body Wall: Stranding is present in both inguinal regions compatible
with recent vascular access. No fluid collection/abscess. No
retroperitoneal hemorrhage.
IMPRESSION: 1. Thickening of small bowel loops in the anatomic pelvis compatible
with enteritis. Differential considerations are inflammatory bowel
disease, infection, and ischemia. No pneumatosis.
2. Small amount of ascites, with intermediate attenuation in the
pelvis suggesting debris or complex ascites.
3. Postoperative changes compatible with recent CABG and bilateral
femoral artery access.
4. Fluid levels within the colon are probably related this small
bowel process.
5. These results were called by telephone at the time of
interpretation on 04/21/2013 at [DATE] to Dr. BIBARCZA PATALY ,
who verbally acknowledged these results.
